# Patient Record
Sex: Female | Born: 1945 | Race: White | Hispanic: No | Marital: Married | Smoking: Never smoker
Health system: Southern US, Community
[De-identification: ages and names within clinical notes are randomized; demographics above are authoritative.]

## PROBLEM LIST (undated history)

## (undated) DIAGNOSIS — K219 Gastro-esophageal reflux disease without esophagitis: Secondary | ICD-10-CM

## (undated) DIAGNOSIS — Z87448 Personal history of other diseases of urinary system: Secondary | ICD-10-CM

## (undated) DIAGNOSIS — E785 Hyperlipidemia, unspecified: Secondary | ICD-10-CM

## (undated) DIAGNOSIS — F329 Major depressive disorder, single episode, unspecified: Secondary | ICD-10-CM

## (undated) DIAGNOSIS — F419 Anxiety disorder, unspecified: Secondary | ICD-10-CM

## (undated) DIAGNOSIS — E28319 Asymptomatic premature menopause: Secondary | ICD-10-CM

## (undated) DIAGNOSIS — F32A Depression, unspecified: Secondary | ICD-10-CM

## (undated) DIAGNOSIS — Q632 Ectopic kidney: Secondary | ICD-10-CM

## (undated) DIAGNOSIS — M1990 Unspecified osteoarthritis, unspecified site: Secondary | ICD-10-CM

## (undated) DIAGNOSIS — T7840XA Allergy, unspecified, initial encounter: Secondary | ICD-10-CM

## (undated) HISTORY — PX: ABDOMINAL HYSTERECTOMY: SHX81

## (undated) HISTORY — PX: TONSILLECTOMY: SUR1361

## (undated) HISTORY — DX: Ectopic kidney: Q63.2

## (undated) HISTORY — DX: Unspecified osteoarthritis, unspecified site: M19.90

## (undated) HISTORY — DX: Major depressive disorder, single episode, unspecified: F32.9

## (undated) HISTORY — DX: Depression, unspecified: F32.A

## (undated) HISTORY — DX: Allergy, unspecified, initial encounter: T78.40XA

## (undated) HISTORY — PX: OTHER SURGICAL HISTORY: SHX169

## (undated) HISTORY — DX: Anxiety disorder, unspecified: F41.9

## (undated) HISTORY — DX: Hyperlipidemia, unspecified: E78.5

## (undated) HISTORY — DX: Gastro-esophageal reflux disease without esophagitis: K21.9

## (undated) HISTORY — DX: Personal history of other diseases of urinary system: Z87.448

## (undated) HISTORY — DX: Asymptomatic premature menopause: E28.319

---

## 1975-07-20 HISTORY — PX: OOPHORECTOMY: SHX86

## 1988-07-19 HISTORY — PX: OTHER SURGICAL HISTORY: SHX169

## 1993-07-19 HISTORY — PX: BREAST BIOPSY: SHX20

## 2007-07-20 DIAGNOSIS — Z87448 Personal history of other diseases of urinary system: Secondary | ICD-10-CM

## 2007-07-20 HISTORY — DX: Personal history of other diseases of urinary system: Z87.448

## 2007-11-09 ENCOUNTER — Emergency Department (HOSPITAL_COMMUNITY): Admission: EM | Admit: 2007-11-09 | Discharge: 2007-11-09 | Payer: Self-pay | Admitting: Family Medicine

## 2007-11-20 ENCOUNTER — Encounter: Admission: RE | Admit: 2007-11-20 | Discharge: 2007-11-20 | Payer: Self-pay | Admitting: Internal Medicine

## 2008-04-04 ENCOUNTER — Ambulatory Visit: Payer: Self-pay | Admitting: Cardiology

## 2008-04-04 LAB — CONVERTED CEMR LAB
BUN: 21 mg/dL (ref 6–23)
Basophils Absolute: 0.1 10*3/uL (ref 0.0–0.1)
Basophils Relative: 1 % (ref 0.0–3.0)
CO2: 28 meq/L (ref 19–32)
Calcium: 9.5 mg/dL (ref 8.4–10.5)
Creatinine, Ser: 0.8 mg/dL (ref 0.4–1.2)
Eosinophils Absolute: 0.2 10*3/uL (ref 0.0–0.7)
Eosinophils Relative: 3.5 % (ref 0.0–5.0)
Hemoglobin: 13.8 g/dL (ref 12.0–15.0)
Lymphocytes Relative: 42.9 % (ref 12.0–46.0)
MCHC: 35.2 g/dL (ref 30.0–36.0)
MCV: 91.7 fL (ref 78.0–100.0)
Neutro Abs: 2.3 10*3/uL (ref 1.4–7.7)
RBC: 4.28 M/uL (ref 3.87–5.11)
aPTT: 36.2 s — ABNORMAL HIGH (ref 21.7–29.8)

## 2008-04-05 ENCOUNTER — Inpatient Hospital Stay (HOSPITAL_BASED_OUTPATIENT_CLINIC_OR_DEPARTMENT_OTHER): Admission: RE | Admit: 2008-04-05 | Discharge: 2008-04-05 | Payer: Self-pay | Admitting: Cardiology

## 2008-04-05 ENCOUNTER — Ambulatory Visit: Payer: Self-pay | Admitting: Cardiology

## 2008-04-11 ENCOUNTER — Encounter: Payer: Self-pay | Admitting: Cardiology

## 2008-04-11 ENCOUNTER — Ambulatory Visit: Payer: Self-pay | Admitting: Cardiology

## 2008-04-11 ENCOUNTER — Ambulatory Visit: Payer: Self-pay

## 2008-04-11 LAB — CONVERTED CEMR LAB
AST: 24 units/L (ref 0–37)
Albumin: 4.2 g/dL (ref 3.5–5.2)
Alkaline Phosphatase: 59 units/L (ref 39–117)
LDL Cholesterol: 101 mg/dL — ABNORMAL HIGH (ref 0–99)
Total Bilirubin: 0.6 mg/dL (ref 0.3–1.2)
Total CHOL/HDL Ratio: 2.9
Total Protein: 7.3 g/dL (ref 6.0–8.3)
VLDL: 20 mg/dL (ref 0–40)

## 2008-04-17 ENCOUNTER — Ambulatory Visit: Payer: Self-pay | Admitting: Cardiology

## 2008-06-07 ENCOUNTER — Ambulatory Visit: Payer: Self-pay | Admitting: Cardiology

## 2008-06-07 LAB — CONVERTED CEMR LAB
Direct LDL: 152 mg/dL
TSH: 2.71 microintl units/mL (ref 0.35–5.50)

## 2008-06-10 ENCOUNTER — Ambulatory Visit: Payer: Self-pay | Admitting: Cardiology

## 2008-07-09 ENCOUNTER — Ambulatory Visit: Payer: Self-pay | Admitting: Cardiology

## 2008-08-05 ENCOUNTER — Ambulatory Visit: Payer: Self-pay | Admitting: Psychology

## 2008-08-26 ENCOUNTER — Ambulatory Visit: Payer: Self-pay | Admitting: Psychology

## 2008-09-09 ENCOUNTER — Ambulatory Visit: Payer: Self-pay | Admitting: Psychology

## 2008-09-23 ENCOUNTER — Ambulatory Visit: Payer: Self-pay | Admitting: Psychology

## 2008-10-21 ENCOUNTER — Ambulatory Visit: Payer: Self-pay | Admitting: Psychology

## 2008-11-04 ENCOUNTER — Ambulatory Visit: Payer: Self-pay | Admitting: Psychology

## 2008-11-11 ENCOUNTER — Encounter: Payer: Self-pay | Admitting: Internal Medicine

## 2008-11-11 ENCOUNTER — Ambulatory Visit: Payer: Self-pay | Admitting: Internal Medicine

## 2008-11-11 DIAGNOSIS — M722 Plantar fascial fibromatosis: Secondary | ICD-10-CM | POA: Insufficient documentation

## 2008-11-11 DIAGNOSIS — G43009 Migraine without aura, not intractable, without status migrainosus: Secondary | ICD-10-CM | POA: Insufficient documentation

## 2008-11-11 DIAGNOSIS — N951 Menopausal and female climacteric states: Secondary | ICD-10-CM | POA: Insufficient documentation

## 2008-11-11 DIAGNOSIS — J309 Allergic rhinitis, unspecified: Secondary | ICD-10-CM | POA: Insufficient documentation

## 2008-11-11 DIAGNOSIS — I059 Rheumatic mitral valve disease, unspecified: Secondary | ICD-10-CM | POA: Insufficient documentation

## 2008-11-11 DIAGNOSIS — F418 Other specified anxiety disorders: Secondary | ICD-10-CM | POA: Insufficient documentation

## 2008-11-11 DIAGNOSIS — E785 Hyperlipidemia, unspecified: Secondary | ICD-10-CM | POA: Insufficient documentation

## 2008-11-11 DIAGNOSIS — K219 Gastro-esophageal reflux disease without esophagitis: Secondary | ICD-10-CM | POA: Insufficient documentation

## 2008-11-13 ENCOUNTER — Encounter (INDEPENDENT_AMBULATORY_CARE_PROVIDER_SITE_OTHER): Payer: Self-pay | Admitting: *Deleted

## 2008-11-13 ENCOUNTER — Encounter: Payer: Self-pay | Admitting: Internal Medicine

## 2008-11-15 ENCOUNTER — Ambulatory Visit: Payer: Self-pay | Admitting: Internal Medicine

## 2008-11-15 ENCOUNTER — Encounter: Payer: Self-pay | Admitting: Internal Medicine

## 2008-11-23 ENCOUNTER — Ambulatory Visit: Payer: Self-pay | Admitting: Internal Medicine

## 2008-11-23 ENCOUNTER — Inpatient Hospital Stay (HOSPITAL_COMMUNITY): Admission: EM | Admit: 2008-11-23 | Discharge: 2008-11-26 | Payer: Self-pay | Admitting: Emergency Medicine

## 2008-11-25 ENCOUNTER — Encounter: Payer: Self-pay | Admitting: Internal Medicine

## 2008-11-26 ENCOUNTER — Encounter: Payer: Self-pay | Admitting: Internal Medicine

## 2008-12-02 ENCOUNTER — Telehealth (INDEPENDENT_AMBULATORY_CARE_PROVIDER_SITE_OTHER): Payer: Self-pay | Admitting: *Deleted

## 2008-12-03 ENCOUNTER — Telehealth (INDEPENDENT_AMBULATORY_CARE_PROVIDER_SITE_OTHER): Payer: Self-pay | Admitting: *Deleted

## 2008-12-06 ENCOUNTER — Telehealth (INDEPENDENT_AMBULATORY_CARE_PROVIDER_SITE_OTHER): Payer: Self-pay | Admitting: *Deleted

## 2008-12-06 ENCOUNTER — Encounter: Payer: Self-pay | Admitting: Internal Medicine

## 2009-01-29 ENCOUNTER — Telehealth: Payer: Self-pay | Admitting: Internal Medicine

## 2009-02-10 ENCOUNTER — Telehealth (INDEPENDENT_AMBULATORY_CARE_PROVIDER_SITE_OTHER): Payer: Self-pay | Admitting: *Deleted

## 2009-02-13 ENCOUNTER — Ambulatory Visit: Payer: Self-pay | Admitting: Internal Medicine

## 2009-02-13 DIAGNOSIS — I6350 Cerebral infarction due to unspecified occlusion or stenosis of unspecified cerebral artery: Secondary | ICD-10-CM | POA: Insufficient documentation

## 2009-02-13 LAB — CONVERTED CEMR LAB
AST: 25 units/L (ref 0–37)
Albumin: 4.4 g/dL (ref 3.5–5.2)
Basophils Absolute: 0 10*3/uL (ref 0.0–0.1)
CO2: 26 meq/L (ref 19–32)
Chloride: 108 meq/L (ref 96–112)
Cholesterol, target level: 200 mg/dL
GFR calc non Af Amer: 77.28 mL/min (ref >60)
Glucose, Bld: 92 mg/dL (ref 70–99)
HCT: 41.4 % (ref 36.0–46.0)
HDL goal, serum: 40 mg/dL
Hemoglobin, Urine: NEGATIVE
Hemoglobin: 14 g/dL (ref 12.0–15.0)
Ketones, ur: NEGATIVE mg/dL
LDL Goal: 100 mg/dL
Lymphs Abs: 2.3 10*3/uL (ref 0.7–4.0)
MCHC: 33.9 g/dL (ref 30.0–36.0)
Monocytes Relative: 9.8 % (ref 3.0–12.0)
Neutro Abs: 2.2 10*3/uL (ref 1.4–7.7)
Potassium: 4.1 meq/L (ref 3.5–5.1)
RDW: 12.7 % (ref 11.5–14.6)
Sodium: 143 meq/L (ref 135–145)
TSH: 3.32 microintl units/mL (ref 0.35–5.50)
Urine Glucose: NEGATIVE mg/dL
Urobilinogen, UA: 0.2 (ref 0.0–1.0)

## 2009-02-14 ENCOUNTER — Encounter: Payer: Self-pay | Admitting: Internal Medicine

## 2009-02-14 DIAGNOSIS — M8580 Other specified disorders of bone density and structure, unspecified site: Secondary | ICD-10-CM | POA: Insufficient documentation

## 2009-02-14 DIAGNOSIS — M810 Age-related osteoporosis without current pathological fracture: Secondary | ICD-10-CM | POA: Insufficient documentation

## 2009-02-24 ENCOUNTER — Encounter: Payer: Self-pay | Admitting: Internal Medicine

## 2009-02-24 ENCOUNTER — Ambulatory Visit: Payer: Self-pay

## 2009-02-25 ENCOUNTER — Encounter: Payer: Self-pay | Admitting: Internal Medicine

## 2009-03-21 ENCOUNTER — Telehealth (INDEPENDENT_AMBULATORY_CARE_PROVIDER_SITE_OTHER): Payer: Self-pay | Admitting: *Deleted

## 2009-03-21 ENCOUNTER — Ambulatory Visit: Payer: Self-pay | Admitting: Internal Medicine

## 2009-03-21 ENCOUNTER — Telehealth: Payer: Self-pay | Admitting: Internal Medicine

## 2009-03-21 LAB — CONVERTED CEMR LAB
Nitrite: POSITIVE
Urobilinogen, UA: 0.2 (ref 0.0–1.0)

## 2009-03-23 ENCOUNTER — Emergency Department (HOSPITAL_COMMUNITY): Admission: EM | Admit: 2009-03-23 | Discharge: 2009-03-23 | Payer: Self-pay | Admitting: Emergency Medicine

## 2009-03-27 ENCOUNTER — Telehealth: Payer: Self-pay | Admitting: Internal Medicine

## 2009-03-31 ENCOUNTER — Ambulatory Visit (HOSPITAL_COMMUNITY): Admission: RE | Admit: 2009-03-31 | Discharge: 2009-03-31 | Payer: Self-pay | Admitting: Internal Medicine

## 2009-04-02 ENCOUNTER — Encounter: Payer: Self-pay | Admitting: Internal Medicine

## 2009-04-07 ENCOUNTER — Telehealth: Payer: Self-pay | Admitting: Internal Medicine

## 2009-06-17 ENCOUNTER — Telehealth: Payer: Self-pay | Admitting: Internal Medicine

## 2009-07-09 ENCOUNTER — Telehealth (INDEPENDENT_AMBULATORY_CARE_PROVIDER_SITE_OTHER): Payer: Self-pay | Admitting: *Deleted

## 2009-08-01 ENCOUNTER — Encounter (INDEPENDENT_AMBULATORY_CARE_PROVIDER_SITE_OTHER): Payer: Self-pay | Admitting: *Deleted

## 2009-08-01 ENCOUNTER — Telehealth: Payer: Self-pay | Admitting: Internal Medicine

## 2009-08-01 ENCOUNTER — Ambulatory Visit: Payer: Self-pay | Admitting: Internal Medicine

## 2009-08-01 DIAGNOSIS — R5381 Other malaise: Secondary | ICD-10-CM | POA: Insufficient documentation

## 2009-08-01 DIAGNOSIS — R9409 Abnormal results of other function studies of central nervous system: Secondary | ICD-10-CM | POA: Insufficient documentation

## 2009-08-01 DIAGNOSIS — F411 Generalized anxiety disorder: Secondary | ICD-10-CM | POA: Insufficient documentation

## 2009-08-01 DIAGNOSIS — H538 Other visual disturbances: Secondary | ICD-10-CM | POA: Insufficient documentation

## 2009-08-01 DIAGNOSIS — R5383 Other fatigue: Secondary | ICD-10-CM

## 2009-09-16 ENCOUNTER — Encounter: Payer: Self-pay | Admitting: Internal Medicine

## 2009-10-03 ENCOUNTER — Encounter: Payer: Self-pay | Admitting: Internal Medicine

## 2009-10-10 ENCOUNTER — Telehealth: Payer: Self-pay | Admitting: Internal Medicine

## 2009-10-10 ENCOUNTER — Ambulatory Visit: Payer: Self-pay | Admitting: Internal Medicine

## 2009-10-10 DIAGNOSIS — N61 Mastitis without abscess: Secondary | ICD-10-CM | POA: Insufficient documentation

## 2009-10-28 ENCOUNTER — Ambulatory Visit: Payer: Self-pay | Admitting: Psychology

## 2009-11-06 ENCOUNTER — Telehealth: Payer: Self-pay | Admitting: Internal Medicine

## 2009-12-18 ENCOUNTER — Ambulatory Visit: Payer: Self-pay | Admitting: Internal Medicine

## 2009-12-18 DIAGNOSIS — B354 Tinea corporis: Secondary | ICD-10-CM | POA: Insufficient documentation

## 2009-12-18 DIAGNOSIS — L255 Unspecified contact dermatitis due to plants, except food: Secondary | ICD-10-CM | POA: Insufficient documentation

## 2010-04-03 ENCOUNTER — Telehealth: Payer: Self-pay | Admitting: Internal Medicine

## 2010-04-06 ENCOUNTER — Ambulatory Visit (HOSPITAL_COMMUNITY): Admission: RE | Admit: 2010-04-06 | Discharge: 2010-04-06 | Payer: Self-pay

## 2010-04-06 ENCOUNTER — Ambulatory Visit: Payer: Self-pay | Admitting: Internal Medicine

## 2010-04-08 ENCOUNTER — Encounter: Payer: Self-pay | Admitting: Cardiology

## 2010-05-05 ENCOUNTER — Telehealth: Payer: Self-pay | Admitting: Internal Medicine

## 2010-05-21 ENCOUNTER — Telehealth: Payer: Self-pay | Admitting: Internal Medicine

## 2010-08-18 NOTE — Consult Note (Signed)
Summary: [facility]   Imported By: Lester Brinson 09/19/2009 11:12:13  _____________________________________________________________________  External Attachment:    Type:   Image     Comment:   External Document

## 2010-08-18 NOTE — Progress Notes (Signed)
Summary: NEEDS CREATIN AND BUN  Phone Note Call from Patient   Caller: Patient Summary of Call: PT NEEDS TO HAVE A CREATIN AND BUN FOR A MRI OF THE BRAIN SHE IS HAVING ON MONDAY AT 2:00 AT [facility].  THIS TEST WAS ORDERED BY DR. Cherre Blanc HER NEUROLOGIST AT [facility].  WILL DR. Jonny Ruiz ORDER THESE LABS FOR HER? CELL NUMBER [phone] Initial call taken by: Hilarie Fredrickson,  April 03, 2010 4:10 PM  Follow-up for Phone Call        ok for bun/cr - v58.69 Follow-up by: Corwin Levins MD,  April 03, 2010 4:28 PM  Additional Follow-up for Phone Call Additional follow up Details #1::        called pt informed of above information. Additional Follow-up by: Robin Ewing CMA Duncan Dull),  April 03, 2010 4:43 PM

## 2010-08-18 NOTE — Progress Notes (Signed)
Summary: lipitor  Phone Note Refill Request Message from:  Fax from Pharmacy on November 06, 2009 8:55 AM  Refills Requested: Medication #1:  LIPITOR 40 MG TABS 1 by mouth at bedtime  Method Requested: Electronic Initial call taken by: Orlan Leavens,  November 06, 2009 8:55 AM    Prescriptions: LIPITOR 40 MG TABS (ATORVASTATIN CALCIUM) 1 by mouth at bedtime  #30 x 3   Entered by:   Orlan Leavens   Authorized by:   Etta Grandchild MD   Signed by:   Orlan Leavens on 11/06/2009   Method used:   [facility]        [facility] [phone][facility] (retail[facility]       [address]       [place], [place]  [address]       Ph: [phone] or [phone]       Fax: [phone]   RxID:   [id number]

## 2010-08-18 NOTE — Progress Notes (Signed)
Summary: REFERRAL   Phone Note Call from Patient   Summary of Call: See phone note from 12/22. Pt requested a referral to [facility] for neurology. She called to check the status of this. No new referral was put in, please order in.  Initial call taken by: Lamar Sprinkles, CMA,  August 01, 2009 11:38 AM     Appended Document: REFERRAL  Called [facility] Law[facility] Cousins [phone] scheduled for march 1@11 :00 Dr Cherre Blanc -lmtc  faxed notes to [phone] left msg to inform pt not available -lmtcb

## 2010-08-18 NOTE — Progress Notes (Signed)
  Phone Note Call from Patient   Caller: Patient Summary of Call: Pt called to get a refill on Imitrex. Per previous phone note, [facility] [address] Pt has been informed of refill. Initial call taken by: Alysia Penna,  May 21, 2010 4:53 PM

## 2010-08-18 NOTE — Assessment & Plan Note (Signed)
Summary: trouble focusing / SD   Vital Signs:  Patient profile:   65 year old female Height:      65 inches Weight:      160 pounds BMI:     26.72 O2 Sat:      97 % on Room air Temp:     97.4 degrees F oral Pulse rate:   71 / minute BP sitting:   130 / 80  (left arm) Cuff size:   regular  Vitals Entered ByZella Ball Ewing (August 01, 2009 2:30 PM)  O2 Flow:  Room air CC: trouble focusing,anxious/RE   Primary Care Provider:  Etta Grandchild MD  CC:  trouble focusing and anxious/RE.  History of Present Illness: works as asst to HR for [employer];  has some increased cognitive errors at work, but not clear if due to anxiety or other; states had episode of blurred vision  for about 3 hrs but resolved but no headache and back to noraml since;  Pt denies CP, sob, doe, wheezing, orthopnea, pnd, worsening LE edema, palps, dizziness or syncope  Pt denies other new neuro symptoms such as headache, facial or extremity weakness .  Has ongoing severe stressors at work.  Problems Prior to Update: 1)  Magnetic Resonance Imaging, Brain, Abnormal  (ICD-794.09) 2)  Fatigue  (ICD-780.79) 3)  Anxiety  (ICD-300.00) 4)  Blurred Vision  (ICD-368.8) 5)  Cva  (ICD-434.91) 6)  Plantar Fasciitis, Right  (ICD-728.71) 7)  Preventive Health Care  (ICD-V70.0) 8)  Osteopenia  (ICD-733.90) 9)  Menopause, Early  (ICD-627.2) 10)  Hyperlipidemia  (ICD-272.4) 11)  Depression  (ICD-311) 12)  Mitral Valve Prolapse  (ICD-424.0) 13)  Common Migraine  (ICD-346.10) 14)  Allergic Rhinitis  (ICD-477.9) 15)  Gerd  (ICD-530.81)  Medications Prior to Update: 1)  Imitrex 50 Mg Tabs (Sumatriptan Succinate) .Marland Kitchen.. 1 By Mouth Once Daily As Needed For Headaches 2)  Advil Cold/sinus 30-200 Mg Tabs (Pseudoephedrine-Ibuprofen) .Marland Kitchen.. 1 By Mouth Once Daily As Needed 3)  Lipitor 40 Mg Tabs (Atorvastatin Calcium) .Marland Kitchen.. 1 By Mouth At Bedtime 4)  Asa 325mg  .... Take 1 Tablet By Mouth Once A Day 5)  Valium 5 Mg Tabs (Diazepam) ....  Take 1-2 By Mouth One Hour Prior To Mri For Anxiety  Current Medications (verified): 1)  Imitrex 50 Mg Tabs (Sumatriptan Succinate) .Marland Kitchen.. 1 By Mouth Once Daily As Needed For Headaches 2)  Advil Cold/sinus 30-200 Mg Tabs (Pseudoephedrine-Ibuprofen) .Marland Kitchen.. 1 By Mouth Once Daily As Needed 3)  Lipitor 40 Mg Tabs (Atorvastatin Calcium) .Marland Kitchen.. 1 By Mouth At Bedtime 4)  Asa 325mg  .... Take 1 Tablet By Mouth Once A Day 5)  Valium 5 Mg Tabs (Diazepam) .... 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   Head:  normocephalic and atraumatic.   Eyes:  vision grossly intact, pupils equal, and pupils round.   Ears:  R ear normal and L ear normal.   Nose:  no external deformity and no nasal discharge.   Mouth:  no gingival abnormalities and pharynx pink and moist.   Neck:  supple and no masses.   Lungs:  normal respiratory effort and normal breath sounds.   Heart:  normal rate and regular rhythm.   Abdomen:  soft, non-tender, and normal  bowel sounds.   Msk:  no joint tenderness and no joint swelling.   Extremities:  no edema, no erythema  Neurologic:  cranial nerves II-XII intact, strength normal in all extremities, and sensation intact to light touch.     Impression & Recommendations:  Problem # 1:  BLURRED VISION (ICD-368.8) recurrent - diff inlcudes ocular migraine, atypical migraine, or even  MS, or even option neuritis or TIA - asymtpo now, exam benign  but will nede referral to neuro, already referred per dr Yetta Barre  Problem # 2:  ANXIETY (ICD-300.00)  Her updated medication list for this problem includes:    Valium 5 Mg Tabs (Diazepam) .Marland Kitchen... Take 1-2 by mouth one hour prior to mri for anxiety stable overall by hx and exam, ok to continue meds/tx as is, flare today likely secondary to above but does quite a bit of tension over work responsiblities  Problem # 3:  FATIGUE (ICD-780.79) exam benign, to check labs below; follow with expectant management  Orders: TLB-BMP (Basic Metabolic Panel-BMET) (80048-METABOL) TLB-CBC Platelet - w/Differential (85025-CBCD) TLB-Hepatic/Liver Function Pnl (80076-HEPATIC) TLB-TSH (Thyroid Stimulating Hormone) (84443-TSH) TLB-Sedimentation Rate (ESR) (85652-ESR) TLB-IBC Pnl (Iron/FE;Transferrin) (83550-IBC) TLB-B12 + Folate Pnl (82746_82607-B12/FOL) TLB-Udip ONLY (81003-UDIP)  Problem # 4:  MAGNETIC RESONANCE IMAGING, BRAIN, ABNORMAL (ICD-794.09) as above, to f/u neuro, results reviewed with pt  Complete Medication List: 1)  Imitrex 50 Mg Tabs (Sumatriptan succinate) .Marland Kitchen.. 1 by mouth once daily as needed for headaches 2)  Advil Cold/sinus 30-200 Mg Tabs (Pseudoephedrine-ibuprofen) .Marland Kitchen.. 1 by mouth once daily as needed 3)  Lipitor 40 Mg Tabs (Atorvastatin calcium) .Marland Kitchen.. 1 by mouth at bedtime 4)  Asa 325mg   .... Take 1 tablet by mouth once a day 5)  Valium 5 Mg Tabs (Diazepam) .... Take 1-2 by mouth one hour prior to mri for anxiety  Other Orders: T-Vitamin D (25-Hydroxy)  [phone])  Patient Instructions: 1)  please make appt with opthomology for exam 2)  Continue all previous medications as before this visit  3)  Please go to the Lab in the basement for your blood and/or urine tests today 4)  You will be contacted about the referral(s) to: Neurology 5)  Please schedule an appointment with your primary doctor as needed

## 2010-08-18 NOTE — Progress Notes (Signed)
  Phone Note Refill Request Message from:  Patient/call a nurse on May 21, 2010 8:59 AM  Refills Requested: Medication #1:  IMITREX 50 MG TABS 1 by mouth once daily as needed for headaches Initial call taken by: Rock Nephew CMA,  May 21, 2010 8:59 AM    Prescriptions: IMITREX 50 MG TABS (SUMATRIPTAN SUCCINATE) 1 by mouth once daily as needed for headaches  #12 x 5   Entered by:   Rock Nephew CMA   Authorized by:   Etta Grandchild MD   Signed by:   Rock Nephew CMA on 05/21/2010   Method used:   [facility]        [facility] [phone][facility] (retail[facility]       [address]       [place], [place]  [address]       Ph: [phone] or [phone]       Fax: [phone]   RxID:   [id number]

## 2010-08-18 NOTE — Assessment & Plan Note (Signed)
Summary: JONES PT--MASTITIS. RED AREA ON RIGHT BREAST AND SWOLLEN LYMP...   Vital Signs:  Patient profile:   65 year old female Height:      64 inches Weight:      160.75 pounds BMI:     27.69 O2 Sat:      96 % on Room air Temp:     97 degrees F oral Pulse rate:   72 / minute BP sitting:   128 / 72  (left arm) Cuff size:   regular  Vitals Entered ByZella Ball Ewing (October 10, 2009 4:13 PM)  O2 Flow:  Room air CC: Lymph node under arm swollen, red/RE   Primary Care Provider:  Etta Grandchild MD  CC:  Lymph node under arm swollen and red/RE.  History of Present Illness: here with onset late last PM red, tender, sweling to relatively small area near the right areola, mild breast tender and noted tenderness to a small area in the right axilla as well;  no left breast or axilla symptoms;  no fever, chills, ST, cough, and Pt denies CP, sob, doe, wheezing, orthopnea, pnd, worsening LE edema, palps, dizziness or syncope   No prior hx of mastitis or recent trauma. or known immune disorder.  recent for MS neg per pt, had LP recently neg per pt, despite MRI suggestive of MS.  Has hx of migraine, but MRI abnormal thought due to CVA? Marland KitchenPt denies new neuro symptoms such as headache, facial or extremity weakness   Problems Prior to Update: 1)  Mastitis  (ICD-611.0) 2)  Magnetic Resonance Imaging, Brain, Abnormal  (ICD-794.09) 3)  Fatigue  (ICD-780.79) 4)  Anxiety  (ICD-300.00) 5)  Blurred Vision  (ICD-368.8) 6)  Cva  (ICD-434.91) 7)  Plantar Fasciitis, Right  (ICD-728.71) 8)  Preventive Health Care  (ICD-V70.0) 9)  Osteopenia  (ICD-733.90) 10)  Menopause, Early  (ICD-627.2) 11)  Hyperlipidemia  (ICD-272.4) 12)  Depression  (ICD-311) 13)  Mitral Valve Prolapse  (ICD-424.0) 14)  Common Migraine  (ICD-346.10) 15)  Allergic Rhinitis  (ICD-477.9) 16)  Gerd  (ICD-530.81)  Medications Prior to Update: 1)  Imitrex 50 Mg Tabs (Sumatriptan Succinate) .Marland Kitchen.. 1 By Mouth Once Daily As Needed For  Headaches 2)  Advil Cold/sinus 30-200 Mg Tabs (Pseudoephedrine-Ibuprofen) .Marland Kitchen.. 1 By Mouth Once Daily As Needed 3)  Lipitor 40 Mg Tabs (Atorvastatin Calcium) .Marland Kitchen.. 1 By Mouth At Bedtime 4)  Asa 325mg  .... Take 1 Tablet By Mouth Once A Day 5)  Valium 5 Mg Tabs (Diazepam) .... Take 1-2 By Mouth One Hour Prior To Mri For Anxiety  Current Medications (verified): 1)  Imitrex 50 Mg Tabs (Sumatriptan Succinate) .Marland Kitchen.. 1 By Mouth Once Daily As Needed For Headaches 2)  Advil Cold/sinus 30-200 Mg Tabs (Pseudoephedrine-Ibuprofen) .Marland Kitchen.. 1 By Mouth Once Daily As Needed 3)  Lipitor 40 Mg Tabs (Atorvastatin Calcium) .Marland Kitchen.. 1 By Mouth At Bedtime 4)  Asa 325mg  .... Take 1 Tablet By Mouth Once A Day 5)  Valium 5 Mg Tabs (Diazepam) .... Take 1-2 By Mouth One Hour Prior To Mri For Anxiety 6)  Doxycycline Hyclate 100 Mg Caps (Doxycycline Hyclate) .Marland Kitchen.. 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 Osteoporosis hx of pyelonephritis/sepsis 2009 Anxiety  Past Surgical History: Last updated:  11/11/2008 s/p right ectopic kidney at 65 yo hx of breast biopsy neg - approx 1990 Hysterectomy - due to fibroids  Oophorectomy - due to endometriosis - 1977 Tonsillectomy  Social History: Last updated: 11/11/2008 currently working as Programmer, 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   Head:  normocephalic and atraumatic.   Eyes:  vision grossly intact, pupils equal, and pupils round.     Ears:  R ear normal and L ear normal.   Nose:  no external deformity and no nasal discharge.   Mouth:  no gingival abnormalities and pharynx pink and moist.   Neck:  supple and no masses.   Breasts:  left breast without mass, swelling or erythema;  right breast with 1.5 cm area touching and adjacent to the right areola upper medial quad with small central induration, tender but nonfluctuant, non draining and no nipple d/c;  some tenderness noted in the tail of the breast but no skin changes; also noted right axilla with approx 8mm area tender eyrthema "nodule" prob lymph node again without suppuration Lungs:  normal respiratory effort and normal breath sounds.   Heart:  normal rate and regular rhythm.   Extremities:  no edema, no erythema  Neurologic:  alert & oriented X3 and strength grossly normal in all extremities.     Impression & Recommendations:  Problem # 1:  MASTITIS (ICD-611.0) pt is [employer] employee (actually [employer] who does have contact with clinical nurses on a regular basis, one of whom was known MRSA infection);  cant r/o MRSA in this case - for doxycycline course and follow closely,  to follow for fever, worsening pain, swelling , erythema, chills that should indicate need to present to ER for failed outpt therapy evaluation;  also I menitoned though seems remote at this time the possibility of inflammatory bresat cancer though very rare;  she understands if no better she should return for further eval early next wk and if worse should go to ER   Complete Medication List: 1)  Imitrex 50 Mg Tabs (Sumatriptan succinate) .Marland Kitchen.. 1 by mouth once daily as needed for headaches 2)  Advil Cold/sinus 30-200 Mg Tabs (Pseudoephedrine-ibuprofen) .Marland Kitchen.. 1 by mouth once daily as needed 3)  Lipitor 40 Mg Tabs (Atorvastatin calcium) .Marland Kitchen.. 1 by mouth at bedtime 4)  Asa 325mg   .... Take 1 tablet by mouth once a day 5)  Valium 5 Mg Tabs (Diazepam) .... Take 1-2 by mouth one hour prior to mri for  anxiety 6)  Doxycycline Hyclate 100 Mg Caps (Doxycycline hyclate) .Marland Kitchen.. 1 by mouth two times a day  Patient Instructions: 1)  Please take all new medications as prescribed 2)  Continue all previous medications as before this visit  3)  Please schedule an appointment with your primary doctor as needed Prescriptions: DOXYCYCLINE HYCLATE 100 MG CAPS (DOXYCYCLINE HYCLATE) 1 by mouth two times a day  #20 x 0   Entered and Authorized by:   Corwin Levins MD   Signed by:   Corwin Levins MD on 10/10/2009   Method used:   [facility]        [facility] [phone][facility] (retail)  [address] [place], [place]  [address]       Ph: [phone] or [phone]       Fax: [phone]   RxID:   [id number]

## 2010-08-18 NOTE — Progress Notes (Signed)
Summary: mastitis  Phone Note Call from Patient Call back at Work Phone [phone]   Summary of Call: Patient left message on triage that she is having mastitis. Patient c/o pain and redness @ right breast and swollen lymph node. Should ABX be started? Patient is an employee at [employer] and can come over at anytime if needed. Today the patient works until 3:30 pm. Please advise. Initial call taken by: Lucious Groves,  October 10, 2009 9:42 AM  Follow-up for Phone Call        can she be seen per dr Felicity Coyer today? Follow-up by: Corwin Levins MD,  October 10, 2009 1:30 PM  Additional Follow-up for Phone Call Additional follow up Details #1::        Spoke with patient and she will come this pm to see Leschber. Additional Follow-up by: Lucious Groves,  October 10, 2009 2:16 PM

## 2010-08-18 NOTE — Progress Notes (Signed)
Summary: OV TODAY  Phone Note Call from Patient Call back at Home Phone [phone]   Caller: Patient Call For: Dr Yetta Barre Summary of Call: Pt called states she left message this am regarding visual disturbances last week and not feeling right today. Pt is at work now, [phone] - pt is currently workng. Please call pt asap. Initial call taken by: Verdell Face,  August 01, 2009 12:47 PM  Follow-up for Phone Call        Pt c/o "visual disturbance" for approx 4 to 5 hours once last week. Today she c/o feeling as though she can not focus well. She has had increase in anxiety since her stroke. She does deep breathing, goes to a quiet place and prays when this happens. She did this today and has had some relief. Pt is scheduled for office visit today with Dr Jonny Ruiz for eval. She works at [employer] and has an Charity fundraiser there keeping an eye on her until office visit at 2 today. She will go to the ER with any siginifigant change in symptoms.  Follow-up by: Lamar Sprinkles, CMA,  August 01, 2009 1:14 PM  Additional Follow-up for Phone Call Additional follow up Details #1::        noted Additional Follow-up by: Corwin Levins MD,  August 01, 2009 1:21 PM

## 2010-08-18 NOTE — Progress Notes (Signed)
Summary: refill  Phone Note Refill Request Message from:  Fax from Pharmacy on May 05, 2010 12:41 PM  Refills Requested: Medication #1:  IMITREX 50 MG TABS 1 by mouth once daily as needed for headaches   Dosage confirmed as above?Dosage Confirmed   Supply Requested: 1 month Initial call taken by: Rock Nephew CMA,  May 05, 2010 12:41 PM    Prescriptions: IMITREX 50 MG TABS (SUMATRIPTAN SUCCINATE) 1 by mouth once daily as needed for headaches  #12 x 1   Entered by:   Rock Nephew CMA   Authorized by:   Etta Grandchild MD   Signed by:   Rock Nephew CMA on 05/05/2010   Method used:   [facility]ronically to        [facility] [phone]* (retail)       [address]       [place], [place]  [address]       Ph: [phone] or [phone]       Fax: [phone]   RxID:   [id number]

## 2010-08-18 NOTE — Assessment & Plan Note (Signed)
Summary: rash right breast/cd   Vital Signs:  Patient profile:   65 year old female Height:      64 inches Weight:      164 pounds BMI:     28.25 O2 Sat:      97 % on Room air Temp:     98.3 degrees F oral Pulse rate:   67 / minute Pulse rhythm:   regular Resp:     16 per minute BP sitting:   120 / 72  (left arm) Cuff size:   large  Vitals Entered By: Rock Nephew CMA (December 18, 2009 1:53 PM)  Nutrition Counseling: Patient's BMI is greater than 25 and therefore counseled on weight management options.  O2 Flow:  Room air CC: Itchy rash on R breast, neck an arms Pain Assessment Patient in pain? no        Primary Care Provider:  Etta Grandchild MD  CC:  Itchy rash on R breast and neck an arms.  History of Present Illness: She returns c/o a pruritic rash for about 3 weeks. She has been "pulling weeds" and developed a rash on right forearm and left side of neck but she also has a burning, stinging rash under both breasts (R>L). She has tried OTC cortisone cream without much relief.  Current Medications (verified): 1)  Imitrex 50 Mg Tabs (Sumatriptan Succinate) .Marland Kitchen.. 1 By Mouth Once Daily As Needed For Headaches 2)  Advil Cold/sinus 30-200 Mg Tabs (Pseudoephedrine-Ibuprofen) .Marland Kitchen.. 1 By Mouth Once Daily As Needed 3)  Lipitor 40 Mg Tabs (Atorvastatin Calcium) .Marland Kitchen.. 1 By Mouth At Bedtime 4)  Asa 325mg  .... Take 1 Tablet By Mouth Once A Day 5)  Valium 5 Mg Tabs (Diazepam) .... Take 1-2 By Mouth One Hour Prior To Mri For Anxiety  Allergies (verified): No Known Drug Allergies  Past History:  Past Medical History: Reviewed history from 08/01/2009 and no changes required. 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 s/p right ectopic kidney at 65 yo hx of breast biopsy neg - approx 1990 Hysterectomy - due to fibroids  Oophorectomy - due to endometriosis - 1977 Tonsillectomy  Family History: Reviewed history from 11/11/2008 and no changes required. mother, cousin and aunt with breast cancer mother and uncle with ETOH abuse mother and sisters with elevated cholesterol uncle with DM  Social History: Reviewed history from 11/11/2008 and no changes required. 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  The patient denies anorexia, fever, weight loss, chest pain, peripheral edema, prolonged cough, headaches, hemoptysis, abdominal pain, hematuria, enlarged lymph nodes, angioedema, and breast masses.   Derm:  Complains of itching and rash; denies changes in color of skin, changes in nail beds, dryness, flushing, lesion(s), and poor wound healing. Heme:  Denies abnormal bruising, enlarge lymph nodes, and fevers.  Physical Exam  General:  alert and well-developed.   Head:  normocephalic and atraumatic.   Eyes:  No corneal or conjunctival inflammation noted. EOMI. Perrla. Funduscopic exam benign, without hemorrhages, exudates or papilledema. Vision grossly  normal. Mouth:  no gingival abnormalities and pharynx pink and moist.   Neck:  supple and no masses.   Lungs:  normal respiratory effort and normal breath sounds.   Heart:  normal rate and regular rhythm.   Abdomen:  soft, non-tender, and normal bowel sounds.   Msk:  normal ROM, no joint tenderness, no joint swelling, no joint warmth, no redness over joints, no joint deformities, no joint instability, and no crepitation.   Pulses:  R and L carotid,radial,femoral,dorsalis pedis and posterior tibial pulses are full and equal  bilaterally Extremities:  no edema, no erythema  Neurologic:  alert & oriented X3 and strength grossly normal in all extremities.   Skin:  she has two areas of linear patches of erythema and scaling with early lichenification on the right FA and left neck. She has a separate rash under the breasts that is evidenced as confluent wet, erythematous, patches with sharp edges and no satellties lesions, erythema, streaking, exudate, or induration. Cervical Nodes:  no anterior cervical adenopathy and no posterior cervical adenopathy.   Axillary Nodes:  no R axillary adenopathy and no L axillary adenopathy.   Psych:  Cognition and judgment appear intact. Alert and cooperative with normal attention span and concentration. No apparent delusions, illusions, hallucinations   Impression & Recommendations:  Problem # 1:  TINEA CORPORIS (ICD-110.5) Assessment New start loprox  Problem # 2:  CONTACT DERMATITIS&OTHER ECZEMA DUE TO PLANTS (ICD-692.6) Assessment: New  give depo-medrol IM today Her updated medication list for this problem includes:    Clobetasol Propionate 0.05 % Oint (Clobetasol propionate) .Marland Kitchen... Apply to rash on right arm and neck two times a day for 14 days  Orders: Depo- Medrol 80mg  (J1040) Depo- Medrol 40mg  (J1030) Admin of Therapeutic Inj  intramuscular or subcutaneous (62694)  Complete Medication List: 1)  Imitrex 50 Mg Tabs (Sumatriptan succinate) .Marland Kitchen.. 1 by mouth once daily as needed for headaches 2)  Advil Cold/sinus 30-200 Mg Tabs (Pseudoephedrine-ibuprofen) .Marland Kitchen.. 1 by mouth once daily as needed 3)  Lipitor 40 Mg Tabs (Atorvastatin calcium) .Marland Kitchen.. 1 by mouth at bedtime 4)  Asa 325mg   .... Take 1 tablet by mouth once a day 5)  Valium 5 Mg Tabs (Diazepam) .... Take 1-2 by mouth one hour prior to mri for anxiety 6)  Loprox 0.77 % Gel (Ciclopirox) .... Apply to rash under breasts two times a day for 14 days 7)  Clobetasol Propionate 0.05 % Oint (Clobetasol propionate) .... Apply to  rash on right arm and neck two times a day for 14 days  Patient Instructions: 1)  Please schedule a follow-up appointment in 1 month. 2)  It is important that you exercise regularly at least 20 minutes 5 times a week. If you develop chest pain, have severe difficulty breathing, or feel very tired , stop exercising immediately and seek medical attention. 3)  You need to lose weight. Consider a lower calorie diet and regular exercise.  Prescriptions: CLOBETASOL PROPIONATE 0.05 % OINT (CLOBETASOL PROPIONATE) Apply to rash on right arm and neck two times a day for 14 days  #30 gms x 1   Entered and Authorized by:   Etta Grandchild MD   Signed by:   Etta Grandchild MD on 12/18/2009   Method used:   Electronically to        [facility] [phone]* (retail)       [address]       [place]  [place], [place]  [address]       Ph: [phone] or [phone]       Fax: [phone]   RxID:   [id number] LOPROX 0.77 % GEL (CICLOPIROX) Apply to rash under breasts two times a day for 14 days  #60 gms x 1   Entered and Authorized by:   Etta Grandchild MD   Signed by:   Etta Grandchild MD on 12/18/2009   Method used:   Electronically to        [facility] [phone]* (retail)       [address]       [place], [place]  [address]       Ph: [phone] or [phone]       Fax: [phone]   RxID:   [id number]    Medication Administration  Injection # 1:    Medication: Depo- Medrol 80mg     Diagnosis: CONTACT DERMATITIS&OTHER ECZEMA DUE TO PLANTS (ICD-692.6)    Route: IM    Site: L deltoid    Exp Date: 07/2012    Lot #: [id number]    Mfr: pfizer    Patient tolerated injection without complications    Given by: Rock Nephew CMA (December 19, 2009 1:40 PM)  Injection # 2:    Medication: Depo- Medrol 40mg     Diagnosis: CONTACT DERMATITIS&OTHER ECZEMA DUE TO PLANTS (ICD-692.6)    Route: IM    Site: R deltoid    Exp Date: 01/20114    Lot #: [id number]    Mfr:  pfizer    Patient tolerated injection without complications    Given by: Rock Nephew CMA (December 19, 2009 1:40 PM)  Orders Added: 1)  Est. Patient Level IV [17616] 2)  Depo- Medrol 80mg  [J1040] 3)  Depo- Medrol 40mg  [J1030] 4)  Admin of Therapeutic Inj  intramuscular or subcutaneous [07371]

## 2010-08-18 NOTE — Letter (Signed)
Summary: [facility] - Office Visit  [facility] - Office Visit   Imported By: Marylou Mccoy 05/07/2010 13:12:09  _____________________________________________________________________  External Attachment:    Type:   Image     Comment:   External Document

## 2010-08-18 NOTE — Letter (Signed)
Summary: [facility] Consult Scheduled Letter  [facility]  [address] [place], [place] [address]   Phone: [phone]  Fax: [phone]      08/01/2009 MRN: [id number]  [facility] [address] [place], [place]  [address]    Dear Ms. Mumme,      We have scheduled an appointment for you. At the recommendation of Dr.Thomas Yetta Barre we have scheduled you a consult with Dr.Bushnell on March 1,2011 at 11:00am. Their phone number is [phone].If this appointment day and time is not convenient for you, please feel free to call the office of the doctor you are being referred to at the number listed above and reschedule the appointment.   [facility] [place], [place], [address]  Phone Number:  [phone]    Thank you,  Patient Care Coordinator [facility]

## 2010-09-03 ENCOUNTER — Inpatient Hospital Stay (INDEPENDENT_AMBULATORY_CARE_PROVIDER_SITE_OTHER)
Admission: RE | Admit: 2010-09-03 | Discharge: 2010-09-03 | Disposition: A | Discharge status: Home / Self Care | Payer: Commercial Managed Care - PPO | Source: Ambulatory Visit | Attending: Family Medicine | Admitting: Family Medicine

## 2010-09-03 DIAGNOSIS — J019 Acute sinusitis, unspecified: Secondary | ICD-10-CM

## 2010-10-23 LAB — CBC
Hemoglobin: 14.4 g/dL (ref 12.0–15.0)
MCHC: 33.5 g/dL (ref 30.0–36.0)
RBC: 4.6 MIL/uL (ref 3.87–5.11)
WBC: 8.8 10*3/uL (ref 4.0–10.5)

## 2010-10-23 LAB — POCT I-STAT, CHEM 8
BUN: 17 mg/dL (ref 6–23)
Calcium, Ion: 1.07 mmol/L — ABNORMAL LOW (ref 1.12–1.32)
Chloride: 108 mEq/L (ref 96–112)
Creatinine, Ser: 1.1 mg/dL (ref 0.4–1.2)
Glucose, Bld: 102 mg/dL — ABNORMAL HIGH (ref 70–99)
HCT: 47 % — ABNORMAL HIGH (ref 36.0–46.0)
Potassium: 3.9 mEq/L (ref 3.5–5.1)

## 2010-10-23 LAB — DIFFERENTIAL
Basophils Relative: 0 % (ref 0–1)
Lymphs Abs: 3 10*3/uL (ref 0.7–4.0)
Monocytes Absolute: 0.8 10*3/uL (ref 0.1–1.0)
Monocytes Relative: 9 % (ref 3–12)
Neutro Abs: 4.8 10*3/uL (ref 1.7–7.7)

## 2010-10-23 LAB — URINALYSIS, ROUTINE W REFLEX MICROSCOPIC
Glucose, UA: NEGATIVE mg/dL
Specific Gravity, Urine: 1.019 (ref 1.005–1.030)
Urobilinogen, UA: 0.2 mg/dL (ref 0.0–1.0)

## 2010-10-23 LAB — URINE MICROSCOPIC-ADD ON

## 2010-10-23 LAB — URINE CULTURE

## 2010-10-27 LAB — CBC
HCT: 40.8 % (ref 36.0–46.0)
Hemoglobin: 13.9 g/dL (ref 12.0–15.0)
MCHC: 34 g/dL (ref 30.0–36.0)
MCV: 91.5 fL (ref 78.0–100.0)
Platelets: 260 10*3/uL (ref 150–400)
RDW: 13.8 % (ref 11.5–15.5)

## 2010-10-27 LAB — COMPREHENSIVE METABOLIC PANEL
Albumin: 4 g/dL (ref 3.5–5.2)
Alkaline Phosphatase: 58 U/L (ref 39–117)
BUN: 14 mg/dL (ref 6–23)
Creatinine, Ser: 0.82 mg/dL (ref 0.4–1.2)
Glucose, Bld: 104 mg/dL — ABNORMAL HIGH (ref 70–99)
Total Protein: 7.3 g/dL (ref 6.0–8.3)

## 2010-10-27 LAB — DIFFERENTIAL
Basophils Absolute: 0 10*3/uL (ref 0.0–0.1)
Basophils Relative: 1 % (ref 0–1)
Lymphocytes Relative: 42 % (ref 12–46)
Monocytes Absolute: 0.5 10*3/uL (ref 0.1–1.0)
Monocytes Relative: 11 % (ref 3–12)
Neutro Abs: 2.1 10*3/uL (ref 1.7–7.7)
Neutrophils Relative %: 43 % (ref 43–77)

## 2010-10-27 LAB — ANTIPHOSPHOLIPID SYNDROME EVAL, BLD
Anticardiolipin IgG: <7 [GPL'U] — ABNORMAL LOW (ref <11)
Antiphosphatidylserine IgA: <20 APS U/mL (ref <20.0)
DRVVT: 40.2 secs (ref 36.1–47.0)
Lupus Anticoagulant: NOT DETECTED
PTTLA 4:1 Mix: 47.1 secs (ref 36.3–48.8)

## 2010-10-27 LAB — LIPID PANEL
HDL: 55 mg/dL (ref >39)
LDL Cholesterol: 176 mg/dL — ABNORMAL HIGH (ref 0–99)
Triglycerides: 168 mg/dL — ABNORMAL HIGH (ref <150)
VLDL: 34 mg/dL (ref 0–40)

## 2010-10-27 LAB — URINALYSIS, ROUTINE W REFLEX MICROSCOPIC
Bilirubin Urine: NEGATIVE
Hgb urine dipstick: NEGATIVE
Nitrite: NEGATIVE
Specific Gravity, Urine: 1.011 (ref 1.005–1.030)
Urobilinogen, UA: 0.2 mg/dL (ref 0.0–1.0)
pH: 5.5 (ref 5.0–8.0)

## 2010-10-27 LAB — URINE MICROSCOPIC-ADD ON

## 2010-10-27 LAB — ANA: Anti Nuclear Antibody(ANA): NEGATIVE

## 2010-12-01 NOTE — H&P (Signed)
NAME:  Ashley Cruz, BERGEVIN              ACCOUNT NO.:  [id number]   MEDICAL RECORD NO.:  [id number]          PATIENT TYPE:  INP   LOCATION:  3712                         FACILITY:  [facility]   PHYSICIAN:  Sanda Linger, MD       DATE OF BIRTH:  1945/12/28   DATE OF ADMISSION:  11/23/2008  DATE OF DISCHARGE:                              HISTORY & PHYSICAL   PRIMARY CARE PHYSICIAN:  Corwin Levins, MD   CARDIOLOGIST:  Everardo Beals. Juanda Chance, MD, [facility]   CHIEF COMPLAINT:  She had an episode of dizziness, confusion, and fever  approximately 9 a.m. today.   HISTORY OF PRESENT ILLNESS:  This is a 65 year old female with no  significant medical history, however, she was found in her house this  morning by her husband to be confused and inappropriate.  Husband said  had left the house to run some errands and has came back to pick her up  at 9 o'clock to go to her grandchild's birthday party.  He said he found  all the doors in the house open and he found her sitting staring at the  TV and she seemed inappropriate and forgetful.  She said she fell to the  time that she was having an out-of-body experience and that she felt  like she was asleep.  She went on to go to her grandchild's birthday  party because she felt that that was important and then her husband  brought to the emergency room for evaluation and treatment.  She has a  history of migraine headaches, but has not recently had a headache and  said she does not think she has taken Imitrex for about 4-6 weeks.  She  did describe it about a year ago where she had a vision loss in the left  eye and she and her neurologist felt that it was an ocular migraine.  That eventually resolved.  She said that with the event today, she had  no paresthesias in her face, arms, or legs.  There was no notice of  slurred speech or ataxia and she did not have any difficulty using her  arms or legs.  She does report a chronic history of palpitations and  said she has  been evaluated by Dr. Charlies Constable and had a normal cardiac  cath; however, her echocardiogram showed mitral valve prolapse.  She  also has a history of vasovagal syncope, but has not recently had an  event like that.  She was feeling well prior to this acute event.  Her  palpitations have not even more severe or less prominent than  previously.  She describes the palpitations as heart racing and  sometimes flip-flopping.  She does not experience chest pain or  shortness of breath, and has no dyspnea on exertion, edema, or fatigue.   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  She had to have 1 on the right removed because it was septic.  She has  a history of hypercholesterolemia, but does not tolerate the statins.  She has a history of  mitral valve prolapse.  History of vasovagal  syncope.  She was recently seen in the office for left foot swelling and  right foot pain.   SOCIAL HISTORY:  She is with her husband.  She does not report any  significant abuse of alcohol and does not smoke cigarettes or use  illicit drugs.  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   FAMILY HISTORY:  Negative for any cerebrovascular accidents.   CURRENT MEDICATIONS:  She has not taken Imitrex for about 4-6 weeks.  She occasionally takes Advil Cold and Sinus for allergy symptoms.   ALLERGIES:  She is allergic to CODEINE.   REVIEW OF SYSTEMS:  Otherwise, unremarkable.   PHYSICAL EXAMINATION:  NEUROLOGIC:  She is an alert, pleasant female.  She does appear forgetful and occasionally has word-finding difficulty,  but she is otherwise alert and oriented to person, place, time, and  situation.  She demonstrates normal gait, speech, and stance.  VITAL SIGNS:  Temperature is 97.2.  Her blood pressures in the emergency  room are 140/69, 123/56.  Her  pulse is 66 and 75 and regular,  respiratory rate is 18-20 and unlabored, pulse ox 98-99% on room air.  HEENT:  Her pupils are about 3 mm bilaterally.  There are equal reactive  to light and accommodation.  Fundi are not visible.  Extraocular  movements are intact without nystagmus.  She has no facial asymmetry and  her cranial nerves are intact.  Nasopharynx, oropharynx, posterior  pharynx have no lesions or exudate.  NECK:  Supple with full range of motion with no limb adenopathy or JVD.  LUNGS:  Clear anteriorly and posteriorly.  CARDIOVASCULAR:  Regular rhythm without murmur, rub, or gallop.  ABDOMEN:  Mildly obese, but there is no hepatosplenomegaly, mass, or  tenderness to palpation.  EXTREMITIES: No cyanosis, clubbing, or edema.  NEUROLOGIC:  She feels like there is a drift in her left upper  extremity, but there is none visible during examination.  Her Romberg  test and station are normal.  She has strength in the upper and lower  extremities, it is diffusely 4+/5.  In the upper extremities, her deep  tendon reflexes were trace and symmetrical in bilateral biceps, triceps,  and brachioradialis.  Deep tendon reflex in the left patella is 1+  brisker than on the right, which is trace.  There are no detectable  reflexes in the Achilles.  Her Babinski responses are equivocal in both  feet.  Her cerebellar testing is normal to heel-to-shin and rapid  alternating movement.  Her pulses are 2+ and equal throughout.  Her mood  is appropriate and calm.   LABORATORY DATA:  Her CBC shows a white count of 4.8, hemoglobin 13.9,  hematocrit 40.8, and platelet count of 260.  Her comprehensive metabolic  panel shows a slightly elevated glucose at 104, otherwise within normal  limits.  LFTs are normal.  Her urine analysis shows trace leukocyte  esterase, otherwise unremarkable.   Scans in the ER; initially, a CT scan was done and it is reporting as  having multiple areas of low density in the  frontal white matter  bilaterally.  There is also a focal low density area in subinsular white  matter lesion on the left.  There are mild diffuse periventricular white  matter hypodensities in the parietal white matter bilaterally.  She went  on to have an MRI of the brain without contrast.  This showed findings  consistent with this CT scan and that she has multiple subcortical white  matter hyperintensities bilaterally.  These are widely distributed in  the frontal parietal white matter.  It was confirmed that there is a  lesion in the subinsular white matter on the left.  The brainstem and  posterior fossa are normal.  There is no evidence of hemorrhage or mass.  The diffusion weighted images show that there is a small possible acute  infarct in the splenium of the corpus callosum on the left.  While she  was in the emergency room, a phone consult was obtained with Dr. Noel Christmas of Neurology.  He requested that she be admitted to Internal  Medicine, and he would consult.   ASSESSMENT:  1. Cerebrovascular accident in what appears to be a complex scenario      with multiple abnormalities on the MRI.  2. Abnormal MRI with changes consistent with white matter disease      versus  MS versus other etiologies.  3. History of hypercholesterolemia.  4. Mild elevation in blood pressure.   PLAN:  1. Admit on an inpatient status for telemetry monitoring to rule out      atrial fibrillation or atrial flutter regarding palpitations.  2. Antiplatelet therapy with high-dose aspirin and anticoagulate with      subcu Lovenox.  3. Neurology consult with Dr. Noel Christmas.  4. Start ACE inhibitor and statin to reduce the risk of a second CVA.  5. Physical Therapy to assist with any residual from the CVA.       Sanda Linger, MD  Electronically Signed     TJ/MEDQ  D:  11/23/2008  T:  11/24/2008  Job:  [id number]   cc:   Corwin Levins, MD  Everardo Beals. Juanda Chance, MD, [facility]

## 2010-12-01 NOTE — Consult Note (Signed)
NAME:  Ashley Cruz, Ashley Cruz              ACCOUNT NO.:  [id number]   MEDICAL RECORD NO.:  [id number]          PATIENT TYPE:  INP   LOCATION:  3712                         FACILITY:  [facility]   PHYSICIAN:  Noel Christmas, MD    DATE OF BIRTH:  1945/07/20   DATE OF CONSULTATION:  11/23/2008  DATE OF DISCHARGE:                                 CONSULTATION   REFERRING PHYSICIAN:  [facility].   REASON FOR CONSULTATION:  Possible acute stroke, presenting as transient  confusion.   HISTORY OF PRESENT ILLNESS:  This is a 65 year old lady who was found by  husband to be confused after returning home this morning after being  away for a short while.  The patient was not aware that the doors were  open and the animals that typically stayed outside were inside the house  (cats).  The patient assumed that she was asleep and her husband woke  her up.  However, husband says she was wide awake and apparently  watching television.  The patient was not aware that the cats had gotten  into the house.  In addition, the patient complained of feeling as if  she were distant from her surroundings and was somewhat in a fog.  Husband indicated that she asked the same questions over and over even  though he continued to repeatedly give her correct answers.  No focal  weakness was noted.  Speech was only minimally slurred.  CT scan of her  head was obtained from the emergency room which showed diffuse white  matter changes, which were nonspecific, but suggestive possible  demyelinating type disease.  An MRI was recommended.  MRI was obtained  which showed possible acute small infarction involving the splenium of  the corpus callosum.  Study was otherwise unremarkable for acute  findings.  The patient has no history of stroke or TIA.  She has not  been on antiplatelet therapy.  Symptoms cleared over several hours and  have not recurred.  There is no clear history of symptoms of this type  at any time in  the past.   The patient also had MRA of her brain which showed no significant  atherosclerotic nor occlusive disease otherwise.   PAST MEDICAL HISTORY:  Remarkable for:  1. Mitral valve prolapse.  2. Noncardiac chest pain.  3. Mild hyperlipidemia.  4. Migraine headaches.   CURRENT MEDICATIONS:  Imitrex 50 mg p.r.n.   FAMILY HISTORY:  Noncontributory.   PHYSICAL EXAMINATION:  GENERAL:  Appearance was that of a middle-aged  lady who was slightly overweight.  She was alert and cooperative and in  no acute distress.  She was well oriented to time as well as place.  Short-term and long-term memory were normal.  Affect was appropriate for  the most part.  HEENT:  Pupils were equal, reacting normally to light.  Extraocular  movements were fully conjugate.  NEUROLOGIC:  Visual fields were intact and normal.  There was no facial  weakness, no facial numbness.  Hearing was normal.  Speech and palatal  movement were normal.  Strength and muscle tone  were normal throughout.  Deep tendon reflexes were normal and symmetrical.  Plantar responses  were flexor.  Sensory examination was normal.  Carotid auscultation was  normal.   CLINICAL IMPRESSION:  Etiology for the patient's transient confusion is  unclear at this point.  Symptoms may be related to possible acute small  stroke involving splenium of corpus callosum.  However, complex partial  seizure disorder of new onset cannot be rule out.   RECOMMENDATIONS:  1. Aspirin 325 mg per day.  2. MRA of the neck with contrast.  3. EEG on Nov 25, 2008.   Thank you for asking me to evaluate Ms. Laughery.      Noel Christmas, MD  Electronically Signed     CS/MEDQ  D:  11/23/2008  T:  11/24/2008  Job:  [id number]

## 2010-12-01 NOTE — Assessment & Plan Note (Signed)
[facility]                            CARDIOLOGY OFFICE NOTE   Ashley Ashley, Ashley                       MRN:          [id number]  DATE:04/04/2008                            DOB:          [birth year]    REFERRING PHYSICIAN:  Hedwig Morton. Juanda Chance, MD   PRIMARY CARE PHYSICIAN:  Dr. Brooke Bonito in [facility].   REASON FOR REFERRAL:  Evaluation of chest pain.   CLINICAL HISTORY:  Ashley Ashley Ashley Ashley Ashley) has a history of chest  pain for a number of years.  She was evaluated 10-15 years ago in [facility] with a nuclear scan.  At that time, she was told that she might  have a slight problem with one of her heart valves.  She has continued  to have some symptoms of chest pain since that time, but recently they  have gotten worse and had become more related to exertion.  She says it  is fairly predictable now that when she walks up a hill, she will get  tight in her chest and have to stop.  She has shortness of breath and  palpitations with this, but no nausea or diaphoresis.  She says she has  not had much in the way of chest pain at rest.  She does give out much  more easily and is more fatigued than she was before.   PAST MEDICAL HISTORY:  Significant for hyperlipidemia.  She has no  history of diabetes or hypertension.  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   CURRENT MEDICATIONS:  1. Crestor 10 mg every other day.  2. Multivitamins.   SOCIAL HISTORY:  She works in as a Financial trader for  [employer].  She is married and has children and grandchildren.  She  does not smoke.   FAMILY HISTORY:  Positive for vascular disease and her mother had  bilateral carotid endarterectomy.  She was a smoker and died at the age  64 of emphysema.  Her father died at the age 70 of cancer.   REVIEW OF SYSTEMS:  Positive for fatigue, some anxiety, and some  increased stress.   PHYSICAL EXAMINATION:  VITAL SIGNS:  Today, the blood pressure was  135/68 and pulse 71 and regular.  NECK:  There was no venous distension.  The carotid pulses were full  without bruits.  CHEST:  Clear without rales or rhonchi.  HEART:  Rhythm was regular.  The heart sounds were normal and no murmurs  or gallops.  ABDOMEN:  Soft without organomegaly.  EXTREMITIES:  Peripheral pulses were full and there was no peripheral  edema.  MUSCULOSKELETAL:  No deformities.  SKIN:  Warm and dry.  NEUROLOGIC:  No focal neurologic signs.   An electrocardiogram was normal.   IMPRESSION:  1. Exertional chest pain with some features suggestive of angina.  2. Hyperlipidemia.  3. Positive family history for vascular disease.   RECOMMENDATIONS:  Ashley Ashley Ashley symptoms are somewhat suggestive of  ischemia and she has  a moderate risk profile.  I think, she needs  further evaluation.  I discussed options of a stress nuclear study  versus a cardiac catheterization.  I think in view of her symptoms, a  catheterization will be more definitive and I would stay with that.  I  discussed this with her and her husband and they are agreeable to this.  We will also get an echocardiogram to rule out other structural heart  disease.  We will schedule this tomorrow and scheduled the followup in a  few weeks on her data and we will get a fasting lipid profile when she  comes in for her echocardiogram.   Her husband works in the restorations and was with her today.     Bruce Elvera Lennox Juanda Chance, MD, [facility]  Electronically Signed    BRB/MedQ  DD: 04/04/2008  DT: 04/04/2008  Job #: [id number]

## 2010-12-01 NOTE — Procedures (Signed)
EEG NUMBER:  [id number].   REFERRING PHYSICIAN:  Rosalyn Gess. Norins, MD of [place].   The study was established on Nov 26, 2007 with the ID number [id number] at  room 37.   This is a routine EEG for the patient who was described as awake, right-  handed 65 year old female individual admitted due to acute mental status  changes.  The patient stated that she felt disconnected and somewhat in  a fog  at the time of her admission.  Activating procedures were included in the 16-channel EEG recording ,  with one channel representing heart rate and rhythm exclusively, and  hyperventilation and photic stimulation.   MEDICATIONS CURRENTLY:  Lovenox, Altace, Crestor, and aspirin.   A posterior dominant background rhythm was established, but the  patient's EEG shows unusual low amplitude.  A 9-Hz rhythm was  established and promptly attenuated with eye opening.  With photic  stimulation, there was eye blink artifact seen, but no epileptiform  activity resulted.  An entrainment was noted up to very high frequencies of 15 and 17 Hz.  The eye blink artifact continued after the patient was no longer exposed  to the photic stimulation maneuvers.  The EKG shows a normal sinus rhythm at 62 beats per minute.  Hyperventilation did lead to some amplitude buildup, but the expected  intermittent generalized slowing was not present.  Epileptiform activity did not result.   CONCLUSION:  This is a normal EEG for the patient's age and conscious  state.       Melvyn Novas, M.D.  Electronically Signed     ZO:XWRU  D:  11/25/2008 15:46:46  T:  11/26/2008 06:35:50  Job #:  [id number]   cc:   Rosalyn Gess. Norins, MD  [address]  [place]  [place] [address]

## 2010-12-01 NOTE — Cardiovascular Report (Signed)
NAME:  Ashley Cruz, JAUREGUI              ACCOUNT NO.:  [id number]   MEDICAL RECORD NO.:  [id number]          PATIENT TYPE:  OIB   LOCATION:  1965                         FACILITY:  [facility]   PHYSICIAN:  Everardo Beals. 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  She has a long history of chest pain,  which has become worse recently.  She describes the pain as a substernal  pain that occurs fairly predictably with exertion.  She does have a  family history of vascular disease and does have hyperlipidemia.  I saw  her yesterday in consultation and we arranged for her to come in for  angiography today.   PROCEDURE:  The procedure was followed by the right femoral artery and  arterial sheath and 6-French preformed coronary catheters.  A front-wall  arterial puncture was performed and Omnipaque contrast was used.  The  patient tolerated the procedure well and left laboratory in satisfactory  condition.   RESULTS:  LEFT MAIN CORONARY ARTERY.  The left main coronary artery is  free of significant disease.   LEFT ANTERIOR DESCENDING ARTERY.  The left anterior descending artery  gave rise to 3  diagonal branches and 2 septal perforators.  These in  the LAD proper were free of significant disease.   CIRCUMFLEX ARTERY.  The circumflex artery gave rise to a ramus branch, a  marginal branch, and posterolateral branch.  These vessels were free of  significant disease.   RIGHT CORONARY ARTERY.  The right coronary artery is a moderate-sized  vessel and gave rise to 2 ventricle branches, a posterior descending  branch, and a posterolateral branch.  There was 30% narrowing of the  proximal right coronary artery.   The left ventriculogram.  The left ventriculogram was performed on the  RAO  projection shows good wall motion with no areas of hypokinesis.  Estimated fraction was 60%.  The mitral valve was slightly furled but  there was no definite prolapse or regurgitation.    The aortic pressure was 159/88 with a mean of 119 and left ventricle  pressure was 159/22.   CONCLUSION:  Minimal nonobstructive coronary artery disease with no  significant obstruction in the LAD and circumflex arteries, 30%  narrowing of the proximal right coronary artery and normal LV function.   RECOMMENDATIONS:  Reassurance.  I am still not certain regarding the  etiology of the patient's symptoms.  She has a history of questionable  valve abnormality and there is some slight filling of her valve on  angiography and she  could have mitral valve prolapse, although I doubt her echo will meet  diagnostic criteria for this.  We will plan to evaluate it with an echo.  She possibly could have microvascular angina since her symptoms are  exertionaland all.  Consider the possibility of doing a stress ECG to  see if she meets criteria for that.  Bruce Elvera Lennox Juanda Chance, MD, [facility]  Electronically Signed     BRB/MEDQ  D:  04/05/2008  T:  04/06/2008  Job:  [id number]   cc:   Hedwig Morton. Juanda Chance, MD  Brooke Bonito

## 2010-12-01 NOTE — Assessment & Plan Note (Signed)
[facility]                            CARDIOLOGY OFFICE NOTE   JUNI, GLAAB                       MRN:          [id number]  DATE:06/10/2008                            DOB:          October 27, 1945    PRIMARY CARE PHYSICIAN:  Dr. Brooke Bonito in [facility].   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  I have evaluated her for a chest pain and she had cardiac  catheterization which was normal and she had an echocardiogram which  showed mitral valve prolapse.  She has had persistent symptoms of chest  pain and palpitations and fatigue.  We tried her on Toprol, but she felt  worse and felt weaker on this until she stopped it.  She has had not  been able to be very active because she gets chest pain and  palpitations.   PAST MEDICAL HISTORY:  Significant for hyperlipidemia.  She was on  Crestor, which she stopped this because she thought she might be causing  symptoms of fatigue.  She is currently on no medications.   PHYSICAL EXAMINATION:  Blood pressure is 133/76 and pulse 61 and  regular.  There was no venous distention.  The carotid pulses were full  without bruits.  Chest was clear.  Cardiac rhythm was regular.  I could  hear no murmurs or gallops.  The abdomen was soft with normal bowel  sounds.  Peripheral pulses were full.  There was no peripheral edema.   IMPRESSION:  1. Chest pain, palpitations, and fatigue.  2. Mitral valve prolapse.  3. Hyperlipidemia with intolerance to CRESTOR.   RECOMMENDATIONS:  Rwanda has gotten into a vicious cycle of decreased  exercise due to symptoms of chest pain, palpitations, and fatigue, and  then increased deconditioning.  I had encouraged her that in general  exercise is safe with mitral valve prolapse and if she has symptoms, she  needs to back off, but she should try again and hopefully over time  these will improve.  Without this, she will become more  deconditioned.  We are going to bring her back in about 2 weeks for standard stress test  to see if we can document her exercise tolerance, see if there are any  arrhythmias with exercise, and see if there are any ST-segment changes  with exercise which might be any indication of microvascular angina.  After this, we will hopefully be able to give her some guidelines about  exercise and get her into a regular program.     Bruce R. Juanda Chance, MD, [facility]  Electronically Signed    BRB/MedQ  DD: 06/10/2008  DT: 06/11/2008  Job #: [id number]

## 2010-12-01 NOTE — Assessment & Plan Note (Signed)
[facility]                            CARDIOLOGY OFFICE NOTE   [name], [name]                       MRN:          [id number]  DATE:04/17/2008                            DOB:          1945/12/18    PRIMARY CARE PHYSICIAN:  Dr. Brooke Bonito in [facility].   CLINICAL HISTORY:  Ashley Cruz returned for a followup office visit  after a recent catheterization.  I saw her with symptoms of chest pain,  palpitations and fatigue and reevaluated with angiography which showed  no evidence of a coronary disease with no visible plaque and good LV  function.  She subsequently had an echocardiogram which showed some  prolapse of the posterior leaf of the mitral valve.  She has continued  to have symptoms.  She gets fatigued and palpitations fairly easily with  less activity.  She has become much less active over the last several  months.  She did have a history of previous mitral valve prolapse many  years ago.   PAST MEDICAL HISTORY:  Significant for hyperlipidemia.  She was on  Crestor, but stopped this after a catheterization because she thought it  might be causing some of her symptoms and she has felt somewhat better  since then.   Her current medications include multivitamins and previously Crestor 5  mg daily.   PHYSICAL EXAMINATION:  VITAL SIGNS:  Today, the blood pressure is 124/73  and the pulse is 65 and regular.  NECK:  There was no venous distension.  The carotid pulses were full  without bruits.  CHEST:  Clear.  CARDIAC:  Rhythm was regular.  There were no murmurs or gallops.  ABDOMEN:  Soft with normal bowel sounds.  EXTREMITIES:  Peripheral pulses were full with no peripheral edema.   ECG was normal.   IMPRESSION:  1. Chest pain, palpitations and fatigue.  2. Mitral valve prolapse.  3. Hyperlipidemia.   RECOMMENDATIONS:  I suspect that [name]'s symptoms are a combination of  mitral valve prolapse and some deconditioning.  I  think we may be able  to help some with a beta-blocker and will start her Toprol-XL 25 mg a  day.  Also, I urged her to get into some regular exercise since I think  much of her problem is related to deconditioning.  She is planning to do  some regular aerobic exercise.  I will plan to see her back in about 8  weeks to follow up on these findings.  Her TSH was borderline, and I  will repeat her TSH in 8 weeks as well as lipid profile  off the Crestor.  We may consider putting her back on Crestor every  other day following at the time of her next visit.     Bruce Elvera Lennox Juanda Chance, MD, [facility]  Electronically Signed    BRB/MedQ  DD: 04/17/2008  DT: 04/18/2008  Job #: [id number]   cc:   Hedwig Morton. Juanda Chance, MD

## 2010-12-01 NOTE — Assessment & Plan Note (Signed)
[facility]                            CARDIOLOGY OFFICE NOTE   [name], [name]                       MRN:          [id number]  DATE:07/09/2008                            DOB:          [birth year]    Ashley Cruz was able to exercise 7 minutes of a modified Bruce protocol and  achieved a heart rate of 136, at which time, the test was terminated.  Resting echocardiogram was normal.  There was no significant arrhythmias  or ST-segment changes during her post exercise.  EKG showed no  significant ST-segment changes.  This was interpreted as a negative  exercise test with borderline exercise tolerance.   I discussed with Ashley Cruz the results of this test.  She has mitral valve  prolapse, and some of her symptoms may be related to this.  This has  gotten her into a vicious cycle of decreased exercise and decreased  conditioning.  I encouraged her to try and get more regular exercise and  that she does not need to be afraid of precipitating any serious cardiac  problem.   She is under a great deal of stress and we made a referral to Dr.  Dellia Cloud to help her deal with a stressful situation.  I am dictating  this dictation after the fax.  I am not certain how soon I gave her a  followup return office visit.     Bruce Elvera Lennox Juanda Chance, MD, [facility]  Electronically Signed    BRB/MedQ  DD: 07/23/2008  DT: 07/24/2008  Job #: [id number]

## 2010-12-04 NOTE — Discharge Summary (Signed)
NAME:  Ashley Cruz, TROCHEZ              ACCOUNT NO.:  [id number]   MEDICAL RECORD NO.:  [id number]          PATIENT TYPE:  INP   LOCATION:  3712                         FACILITY:  [facility]   PHYSICIAN:  Barbette Hair. Artist Pais, DO      DATE OF BIRTH:  14-Jun-1946   DATE OF ADMISSION:  11/23/2008  DATE OF DISCHARGE:  11/26/2008                               DISCHARGE SUMMARY   FOLLOWUP INSTRUCTIONS:  She was advised to follow up with Dr. Pearlean Brownie.  She is to call for an appointment within 1 month.   DISCHARGE MEDICATIONS:  1. Aspirin 325 mg once daily.  2. Lipitor 40 mg once daily.  3. Imitrex 50 mg once daily as needed.   DISCHARGE DIAGNOSIS:  1. Transient confusion, possibly secondary to transient ischemic      attack.  2. Hyperlipidemia.  3. History of migraine headache.  4. History of depression.   HOSPITAL COURSE:  The patient is a 65 year old white female who was  brought into the ER by her husband due to confusion and acting  inappropriately.  He noted she seemed unusually forgetful.  She also  notes having out-of-body experience and that she felt like she was  asleep.  She reported history of migraine headaches but had not had  recent headache for 4-6 weeks.  She had seen a neurologist in the past  for possible ocular migraines.  There was no report of paresthesias in  her face, arms, her legs.  There was no mention of slurred speech or  ataxia.  The patient noted to have chronic history of palpitations and  was evaluated by [facility], Dr. Charlies Constable.  She had normal  cardiac cath.  Echocardiogram notable for mitral valve prolapse.  The  patient was admitted for possible stroke workup.  An MRI/MRA was  obtained.  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  Possible  small subacute infarct splenium of corpus callosum.  The MRA was negative for cerebral aneurysm.  She was seen by Neurology  who also performed EEG.  EEG negative for epileptiform  activity.   The patient also had transcranial Doppler.  It noted normal flow,  direction of velocity in all identifying vessels of the anterior and  posterior circulations, with no evidence of stenosis, vasospasm or  occlusion.  No evidence of intracranial disease noted.  Carotid Dopplers  were also obtained.  There was no evidence of significant plaque noted  in the ICA.  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   The patient's transient confusion returned to baseline.  There was no  ataxia or dysarthria.  Neurology felt she may have had possible TIA  versus complicated migraine.  Dr. Pearlean Brownie did not feel the corpus callosum  lesion was acute.  He recommended aspirin for stroke prevention and  start of statin therapy.   LABORATORY DATA:  CBC on Nov 23, 2008, showed WBC 4.8, H and H of 13.9  and 48.8, platelet count 260,000.  Sed rate was 9.  Lupus anticoagulant  panel was negative.  Basic metabolic profile, sodium  142, potassium 4.3,  chloride 109, CO2 27, glucose 104, BUN 14, creatinine of 0.82.  LFTs  were unremarkable.  Total cholesterol 265, triglycerides 168, LDL 176,  and HDL of 55.  ANA screen negative.  Antiphospholipid titers negative.   CONDITION ON DISCHARGE:  The patient's mental status had returned to  normal.  She was felt medically stable for discharge.      Barbette Hair. Artist Pais, DO  Electronically Signed     Barbette Hair. Artist Pais, DO  Electronically Signed    RDY/MEDQ  D:  01/22/2009  T:  01/23/2009  Job:  [id number]   cc:   Corwin Levins, MD

## 2011-03-01 ENCOUNTER — Emergency Department (HOSPITAL_COMMUNITY)
Admission: EM | Admit: 2011-03-01 | Discharge: 2011-03-02 | Disposition: A | Discharge status: Home / Self Care | Payer: Managed Care, Other (non HMO) | Attending: Emergency Medicine | Admitting: Emergency Medicine

## 2011-03-01 ENCOUNTER — Emergency Department (HOSPITAL_COMMUNITY): Payer: Managed Care, Other (non HMO)

## 2011-03-01 DIAGNOSIS — F411 Generalized anxiety disorder: Secondary | ICD-10-CM | POA: Insufficient documentation

## 2011-03-01 DIAGNOSIS — Z7982 Long term (current) use of aspirin: Secondary | ICD-10-CM | POA: Insufficient documentation

## 2011-03-01 DIAGNOSIS — R5383 Other fatigue: Secondary | ICD-10-CM | POA: Insufficient documentation

## 2011-03-01 DIAGNOSIS — H81399 Other peripheral vertigo, unspecified ear: Secondary | ICD-10-CM | POA: Insufficient documentation

## 2011-03-01 DIAGNOSIS — Z79899 Other long term (current) drug therapy: Secondary | ICD-10-CM | POA: Insufficient documentation

## 2011-03-01 DIAGNOSIS — H5789 Other specified disorders of eye and adnexa: Secondary | ICD-10-CM | POA: Insufficient documentation

## 2011-03-01 DIAGNOSIS — I10 Essential (primary) hypertension: Secondary | ICD-10-CM | POA: Insufficient documentation

## 2011-03-01 DIAGNOSIS — H1130 Conjunctival hemorrhage, unspecified eye: Secondary | ICD-10-CM | POA: Insufficient documentation

## 2011-03-01 DIAGNOSIS — I2510 Atherosclerotic heart disease of native coronary artery without angina pectoris: Secondary | ICD-10-CM | POA: Insufficient documentation

## 2011-03-01 DIAGNOSIS — R55 Syncope and collapse: Secondary | ICD-10-CM | POA: Insufficient documentation

## 2011-03-01 DIAGNOSIS — R5381 Other malaise: Secondary | ICD-10-CM | POA: Insufficient documentation

## 2011-03-01 DIAGNOSIS — Z8673 Personal history of transient ischemic attack (TIA), and cerebral infarction without residual deficits: Secondary | ICD-10-CM | POA: Insufficient documentation

## 2011-03-01 LAB — CBC
HCT: 42.7 % (ref 36.0–46.0)
MCH: 30.5 pg (ref 26.0–34.0)
MCV: 93 fL (ref 78.0–100.0)
RBC: 4.59 MIL/uL (ref 3.87–5.11)
RDW: 13.9 % (ref 11.5–15.5)
WBC: 7.5 10*3/uL (ref 4.0–10.5)

## 2011-03-01 LAB — DIFFERENTIAL
Eosinophils Relative: 3 % (ref 0–5)
Lymphocytes Relative: 44 % (ref 12–46)
Lymphs Abs: 3.3 10*3/uL (ref 0.7–4.0)
Monocytes Relative: 6 % (ref 3–12)

## 2011-03-01 LAB — CK TOTAL AND CKMB (NOT AT ARMC)
CK, MB: 3.1 ng/mL (ref 0.3–4.0)
Total CK: 84 U/L (ref 7–177)

## 2011-03-01 LAB — COMPREHENSIVE METABOLIC PANEL
BUN: 24 mg/dL — ABNORMAL HIGH (ref 6–23)
CO2: 27 mEq/L (ref 19–32)
Chloride: 103 mEq/L (ref 96–112)
Creatinine, Ser: 0.92 mg/dL (ref 0.50–1.10)
GFR calc non Af Amer: >60 mL/min (ref >60)
Total Bilirubin: 0.2 mg/dL — ABNORMAL LOW (ref 0.3–1.2)

## 2011-03-01 LAB — POCT I-STAT TROPONIN I

## 2011-03-10 ENCOUNTER — Ambulatory Visit (INDEPENDENT_AMBULATORY_CARE_PROVIDER_SITE_OTHER): Payer: Managed Care, Other (non HMO) | Admitting: Cardiovascular Disease

## 2011-03-10 ENCOUNTER — Encounter: Payer: Self-pay | Admitting: Cardiovascular Disease

## 2011-03-10 DIAGNOSIS — I059 Rheumatic mitral valve disease, unspecified: Secondary | ICD-10-CM

## 2011-03-10 DIAGNOSIS — E785 Hyperlipidemia, unspecified: Secondary | ICD-10-CM

## 2011-03-10 DIAGNOSIS — I6350 Cerebral infarction due to unspecified occlusion or stenosis of unspecified cerebral artery: Secondary | ICD-10-CM

## 2011-03-10 DIAGNOSIS — R0602 Shortness of breath: Secondary | ICD-10-CM

## 2011-03-10 DIAGNOSIS — I341 Nonrheumatic mitral (valve) prolapse: Secondary | ICD-10-CM

## 2011-03-10 DIAGNOSIS — F411 Generalized anxiety disorder: Secondary | ICD-10-CM

## 2011-03-10 NOTE — Assessment & Plan Note (Signed)
No carotid disease.  F/U Sethi ? Further w/u of demylenating disease

## 2011-03-10 NOTE — Assessment & Plan Note (Signed)
Seems to somatisize a lot of her symptoms.  F/U with neuro and primary

## 2011-03-10 NOTE — Assessment & Plan Note (Signed)
Cholesterol is at goal.  Continue current dose of statin and diet Rx.  No myalgias or side effects.  F/U  LFT's in 6 months. Lab Results  Component Value Date   LDLCALC 96 02/13/2009

## 2011-03-10 NOTE — Assessment & Plan Note (Signed)
No evidence of true Barlows syndrome.  No murmur on exam . Echo

## 2011-03-10 NOTE — Progress Notes (Signed)
65 yo previously seen by Dr. Juanda Chance.  Significant anxiety, depression and cognitive difficulties.  Multiple somatic complaints in past and present never clearly related to heart.  She identifies herself as having significant MVP.  Reviewed echos from 2009 and 2010 and no prolapse only mild MR.  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  Reviewed cath 03/2008 normal cors only 30% RCA.  Presyncope with diaphoresis but no postural signs ever documents.  ? History of TIA with MRI showing white matter disease ? Demylenation, small vessel disease.  History of migraines.  Reassured patient about her heart.  Dont think her somatic complaints of dyspnea, presyncope, SSCP have any cardiac etiology.    ROS: Denies fever, malais, weight loss, blurry vision, decreased visual acuity, cough, sputum,  hemoptysis, pleuritic pain, palpitaitons, heartburn, abdominal pain, melena, lower extremity edema, claudication, or rash.  All other systems reviewed and negative  General: Affect appropriate Healthy:  appears stated age HEENT: normal Neck supple with no adenopathy JVP normal no bruits no thyromegaly Lungs clear with no wheezing and good diaphragmatic motion Heart:  S1/S2 no murmur,rub, gallop or click PMI normal Abdomen: benighn, BS positve, no tenderness, no AAA no bruit.  No HSM or HJR Distal pulses intact with no bruits No edema Neuro non-focal Skin warm and dry No muscular weakness   Current Outpatient Prescriptions  Medication Sig Dispense Refill  . atorvastatin (LIPITOR) 40 MG tablet Take 40 mg by mouth daily.        . meclizine (ANTIVERT) 25 MG tablet Take 25 mg by mouth 3 (three) times daily as needed.        . SUMAtriptan (IMITREX) 50 MG tablet Take 50 mg by mouth every 2 (two) hours as needed.          Allergies  Review of patient's allergies indicates no known allergies.  Electrocardiogram:  NSR 75 poor R wave progression from lead placement Othewise normal  Assessment and Plan

## 2011-03-10 NOTE — Patient Instructions (Signed)
Your physician has requested that you have an echocardiogram. Echocardiography is a painless test that uses sound waves to create images of your heart. It provides your doctor with information about the size and shape of your heart and how well your heart's chambers and valves are working. This procedure takes approximately one hour. There are no restrictions for this procedure.   

## 2011-03-11 ENCOUNTER — Ambulatory Visit (HOSPITAL_COMMUNITY): Payer: Managed Care, Other (non HMO) | Attending: Cardiovascular Disease | Admitting: Radiology

## 2011-03-11 DIAGNOSIS — R0989 Other specified symptoms and signs involving the circulatory and respiratory systems: Secondary | ICD-10-CM | POA: Insufficient documentation

## 2011-03-11 DIAGNOSIS — E785 Hyperlipidemia, unspecified: Secondary | ICD-10-CM | POA: Insufficient documentation

## 2011-03-11 DIAGNOSIS — R0609 Other forms of dyspnea: Secondary | ICD-10-CM | POA: Insufficient documentation

## 2011-03-11 DIAGNOSIS — I079 Rheumatic tricuspid valve disease, unspecified: Secondary | ICD-10-CM | POA: Insufficient documentation

## 2011-03-11 DIAGNOSIS — I059 Rheumatic mitral valve disease, unspecified: Secondary | ICD-10-CM | POA: Insufficient documentation

## 2011-03-11 DIAGNOSIS — R55 Syncope and collapse: Secondary | ICD-10-CM | POA: Insufficient documentation

## 2011-03-11 DIAGNOSIS — R0602 Shortness of breath: Secondary | ICD-10-CM

## 2011-03-11 DIAGNOSIS — I341 Nonrheumatic mitral (valve) prolapse: Secondary | ICD-10-CM

## 2011-03-15 NOTE — Progress Notes (Signed)
pt aware of results Ashley Cruz  

## 2011-05-19 ENCOUNTER — Telehealth: Payer: Self-pay | Admitting: *Deleted

## 2011-05-19 NOTE — Telephone Encounter (Signed)
Spoke with patient. She wants to start disability process and didn't know where to start. C/o hist of TIA, white matter disease, sleep apnea w/no improvement w/CPAP. She feels that work has become overwhelming. I advised her to discuss disability options with her HR dept and then come in for OV with Dr Yetta Barre.

## 2011-06-05 ENCOUNTER — Other Ambulatory Visit: Payer: Self-pay | Admitting: Internal Medicine

## 2011-09-16 ENCOUNTER — Ambulatory Visit (INDEPENDENT_AMBULATORY_CARE_PROVIDER_SITE_OTHER)
Admission: RE | Admit: 2011-09-16 | Discharge: 2011-09-16 | Disposition: A | Discharge status: Home / Self Care | Payer: Managed Care, Other (non HMO) | Source: Ambulatory Visit | Attending: Internal Medicine | Admitting: Internal Medicine

## 2011-09-16 ENCOUNTER — Ambulatory Visit (INDEPENDENT_AMBULATORY_CARE_PROVIDER_SITE_OTHER): Payer: Managed Care, Other (non HMO) | Admitting: Internal Medicine

## 2011-09-16 ENCOUNTER — Encounter: Payer: Self-pay | Admitting: Internal Medicine

## 2011-09-16 DIAGNOSIS — M25519 Pain in unspecified shoulder: Secondary | ICD-10-CM

## 2011-09-16 DIAGNOSIS — J019 Acute sinusitis, unspecified: Secondary | ICD-10-CM

## 2011-09-16 DIAGNOSIS — M25511 Pain in right shoulder: Secondary | ICD-10-CM

## 2011-09-16 DIAGNOSIS — J309 Allergic rhinitis, unspecified: Secondary | ICD-10-CM

## 2011-09-16 MED ORDER — AZITHROMYCIN 500 MG PO TABS: 500.0000 mg | ORAL_TABLET | Freq: Every day | ORAL | Status: AC

## 2011-09-16 MED ORDER — METHYLPREDNISOLONE ACETATE 80 MG/ML IJ SUSP: 120.0000 mg | Freq: Once | INTRAMUSCULAR | Status: DC

## 2011-09-16 MED ORDER — AZELASTINE-FLUTICASONE 137-50 MCG/ACT NA SUSP: 1.0000 | Freq: Two times a day (BID) | NASAL | Status: DC

## 2011-09-16 MED ORDER — CELECOXIB 200 MG PO CAPS: 200.0000 mg | ORAL_CAPSULE | Freq: Every day | ORAL | Status: AC

## 2011-09-16 NOTE — Assessment & Plan Note (Signed)
Start zpak for the infection 

## 2011-09-16 NOTE — Progress Notes (Signed)
Subjective:    Patient ID: Ashley Cruz, female    DOB: 29-Jul-1945, 66 y.o.   MRN: [id number]  Sinusitis This is a new problem. The current episode started in the past 7 days. The problem has been gradually worsening since onset. There has been no fever. Her pain is at a severity of 0/10. She is experiencing no pain. Associated symptoms include chills, congestion, coughing, sinus pressure, sneezing and a sore throat. Pertinent negatives include no diaphoresis, ear pain, headaches, hoarse voice, neck pain, shortness of breath or swollen glands.  Shoulder Pain  The pain is present in the right shoulder. This is a recurrent problem. Episode onset: for 2-3 months. There has been a history of trauma. The problem occurs intermittently. The problem has been unchanged. The quality of the pain is described as aching. The pain is at a severity of 2/10. The pain is mild. Associated symptoms include a limited range of motion. Pertinent negatives include no fever, inability to bear weight, itching, joint locking, joint swelling, numbness, stiffness or tingling. The symptoms are aggravated by activity. She has tried nothing for the symptoms.      Review of Systems  Constitutional: Positive for chills. Negative for fever, diaphoresis, activity change, appetite change and fatigue.  HENT: Positive for congestion, sore throat, rhinorrhea, sneezing, postnasal drip and sinus pressure. Negative for hearing loss, ear pain, nosebleeds, hoarse voice, facial swelling, drooling, mouth sores, trouble swallowing, neck pain, neck stiffness, dental problem, voice change, tinnitus and ear discharge.   Eyes: Negative.   Respiratory: Positive for cough. Negative for apnea, choking, chest tightness, shortness of breath, wheezing and stridor.   Cardiovascular: Negative for chest pain, palpitations and leg swelling.  Gastrointestinal: Negative for nausea, vomiting, abdominal pain, diarrhea, constipation, blood in stool, abdominal  distention and anal bleeding.  Genitourinary: Negative.   Musculoskeletal: Negative for myalgias, back pain, joint swelling, arthralgias, gait problem and stiffness.  Skin: Negative for color change, itching, pallor, rash and wound.  Neurological: Negative for tingling, tremors, seizures, syncope, facial asymmetry, speech difficulty, weakness, light-headedness, numbness and headaches.  Hematological: Negative for adenopathy. Does not bruise/bleed easily.  Psychiatric/Behavioral: Negative.        Objective:   Physical Exam  Vitals reviewed. Constitutional: She is oriented to person, place, and time. She appears well-developed and well-nourished. No distress.  HENT:  Head: No trismus in the jaw.  Right Ear: Hearing, tympanic membrane, external ear and ear canal normal.  Left Ear: Hearing, tympanic membrane, external ear and ear canal normal.  Nose: Mucosal edema and rhinorrhea present. No nose lacerations, sinus tenderness, nasal deformity, septal deviation or nasal septal hematoma. No epistaxis.  No foreign bodies. Right sinus exhibits maxillary sinus tenderness. Right sinus exhibits no frontal sinus tenderness. Left sinus exhibits maxillary sinus tenderness. Left sinus exhibits no frontal sinus tenderness.  Mouth/Throat: Uvula is midline, oropharynx is clear and moist and mucous membranes are normal. Mucous membranes are not pale, not dry and not cyanotic. No uvula swelling. No oropharyngeal exudate, posterior oropharyngeal edema, posterior oropharyngeal erythema or tonsillar abscesses.  Eyes: Conjunctivae are normal. Right eye exhibits no discharge. Left eye exhibits no discharge. No scleral icterus.  Neck: Normal range of motion. Neck supple. No JVD present. No tracheal deviation present. No thyromegaly present.  Cardiovascular: Normal rate, regular rhythm, normal heart sounds and intact distal pulses.  Exam reveals no gallop and no friction rub.   No murmur heard. Pulmonary/Chest: Effort  normal and breath sounds normal. No stridor. No respiratory distress. She has  no wheezes. She has no rales. She exhibits no tenderness.  Abdominal: Soft. Bowel sounds are normal. She exhibits no distension and no mass. There is no tenderness. There is no rebound and no guarding.  Musculoskeletal: She exhibits no edema and no tenderness.       Right shoulder: She exhibits decreased range of motion. She exhibits no tenderness, no bony tenderness, no swelling, no effusion, no crepitus, no laceration, no pain, no spasm, normal pulse and normal strength.  Lymphadenopathy:    She has no cervical adenopathy.  Neurological: She is oriented to person, place, and time.  Skin: Skin is warm and dry. No rash noted. She is not diaphoretic. No erythema. No pallor.  Psychiatric: She has a normal mood and affect. Her behavior is normal. Judgment and thought content normal.          Assessment & Plan:

## 2011-09-16 NOTE — Assessment & Plan Note (Signed)
She was given an injection of depo-medrol IM to relieve her symptoms and she was given samples of dymista to use as well

## 2011-09-16 NOTE — Patient Instructions (Signed)
Sinusitis Sinuses are air pockets within the bones of your face. The growth of bacteria within a sinus leads to infection. The infection prevents the sinuses from draining. This infection is called sinusitis. SYMPTOMS  There will be different areas of pain depending on which sinuses have become infected.  The maxillary sinuses often produce pain beneath the eyes.   Frontal sinusitis may cause pain in the middle of the forehead and above the eyes.  Other problems (symptoms) include:  Toothaches.   Colored, pus-like (purulent) drainage from the nose.   Swelling, warmth, and tenderness over the sinus areas may be signs of infection.  TREATMENT  Sinusitis is most often determined by an exam.X-rays may be taken. If x-rays have been taken, make sure you obtain your results or find out how you are to obtain them. Your caregiver may give you medications (antibiotics). These are medications that will help kill the bacteria causing the infection. You may also be given a medication (decongestant) that helps to reduce sinus swelling.  HOME CARE INSTRUCTIONS   Only take over-the-counter or prescription medicines for pain, discomfort, or fever as directed by your caregiver.   Drink extra fluids. Fluids help thin the mucus so your sinuses can drain more easily.   Applying either moist heat or ice packs to the sinus areas may help relieve discomfort.   Use saline nasal sprays to help moisten your sinuses. The sprays can be found at your local drugstore.  SEEK IMMEDIATE MEDICAL CARE IF:  You have a fever.   You have increasing pain, severe headaches, or toothache.   You have nausea, vomiting, or drowsiness.   You develop unusual swelling around the face or trouble seeing.  MAKE SURE YOU:   Understand these instructions.   Will watch your condition.   Will get help right away if you are not doing well or get worse.  Document Released: 07/05/2005 Document Revised: 03/17/2011 Document Reviewed:  02/01/2007 [facility] Patient Information 2012 [place], [place].Rotator Cuff Injury The rotator cuff is the collective set of muscles and tendons that make up the stabilizing unit of your shoulder. This unit holds in the ball of the humerus (upper arm bone) in the socket of the scapula (shoulder blade). Injuries to this stabilizing unit most commonly come from sports or activities that cause the arm to be moved repeatedly over the head. Examples of this include throwing, weight lifting, swimming, racquet sports, or an injury such as falling on your arm. Chronic (longstanding) irritation of this unit can cause inflammation (soreness), bursitis, and eventual damage to the tendons to the point of rupture (tear). An acute (sudden) injury of the rotator cuff can result in a partial or complete tear. You may need surgery with complete tears. Small or partial rotator cuff tears may be treated conservatively with temporary immobilization, exercises and rest. Physical therapy may be needed. HOME CARE INSTRUCTIONS   Apply ice to the injury for 15 to 20 minutes 3 to 4 times per day for the first 2 days. Put the ice in a plastic bag and place a towel between the bag of ice and your skin.   If you have a shoulder immobilizer (sling and straps), do not remove it for as long as directed by your caregiver or until you see a caregiver for a follow-up examination. If you need to remove it, move your arm as little as possible.   You may want to sleep on several pillows or in a recliner at night to lessen swelling  and pain.   Only take over-the-counter or prescription medicines for pain, discomfort, or fever as directed by your caregiver.   Do simple hand squeezing exercises with a soft rubber ball to decrease hand swelling.  SEEK MEDICAL CARE IF:   Pain in your shoulder increases or new pain or numbness develops in your arm, hand, or fingers.   Your hand or fingers are colder than your other hand.  SEEK IMMEDIATE  MEDICAL CARE IF:   Your arm, hand, or fingers are numb or tingling.   Your arm, hand, or fingers are increasingly swollen and painful, or turn white or blue.  Document Released: 07/02/2000 Document Revised: 03/17/2011 Document Reviewed: 06/25/2008 [facility] Patient Information 2012 [place], [place].

## 2011-09-16 NOTE — Assessment & Plan Note (Signed)
I think she has a rotator cuff syndrome, I will check an xray to look for DJD, she will start taking celebrex for the pain

## 2011-09-20 ENCOUNTER — Telehealth: Payer: Self-pay

## 2011-09-20 MED ORDER — AMOXICILLIN-POT CLAVULANATE 875-125 MG PO TABS: 1.0000 | ORAL_TABLET | Freq: Two times a day (BID) | ORAL | Status: AC

## 2011-09-20 NOTE — Telephone Encounter (Signed)
Patient called [facility] stating that she was seen 09/16/11 and given z-pak for 3 days. She c/o continued bilateral  ear discomfort and cough with green mucous

## 2011-09-20 NOTE — Telephone Encounter (Signed)
Start augmentin 

## 2011-09-21 NOTE — Telephone Encounter (Signed)
Pt.notified

## 2011-10-02 ENCOUNTER — Other Ambulatory Visit: Payer: Self-pay | Admitting: Internal Medicine

## 2012-02-10 ENCOUNTER — Other Ambulatory Visit: Payer: Self-pay

## 2012-02-10 MED ORDER — SUMATRIPTAN SUCCINATE 50 MG PO TABS: ORAL_TABLET | ORAL | Status: DC

## 2012-02-13 ENCOUNTER — Other Ambulatory Visit: Payer: Self-pay | Admitting: Internal Medicine

## 2012-02-15 ENCOUNTER — Other Ambulatory Visit: Payer: Self-pay | Admitting: Internal Medicine

## 2012-04-19 ENCOUNTER — Telehealth: Payer: Self-pay | Admitting: Internal Medicine

## 2012-04-19 MED ORDER — CITALOPRAM HYDROBROMIDE 10 MG PO TABS: 10.0000 mg | ORAL_TABLET | Freq: Every day | ORAL | Status: DC

## 2012-04-19 NOTE — Telephone Encounter (Signed)
done

## 2012-04-19 NOTE — Telephone Encounter (Signed)
Caller: [facility]; Patient Name: Ashley Cruz, Ashley Cruz; PCP: Sanda Linger (Adults only); [facility] Phone Number: [phone]. Patient was prescribed Citalopram HBR 10mg  by mouth daily by her neurologist. She does not have any of this medication left at this time. The neurologist, Dr. Cherre Blanc, told her to have her PCP start prescribing this medication. Patient had left a voicemail on Friday 04/14/12 regarding this information, but has not heard back from anyone. Pharmacy: [facility] Phone: [phone]. PLEASE CALL THE PATIENT BACK AND LET HER KNOW IF THIS MEDICATION CAN BE PRESCRIBED OR IF SHE NEEDS TO HAVE AN OFFICE VISIT. Thanks.

## 2012-05-18 ENCOUNTER — Other Ambulatory Visit: Payer: Self-pay

## 2012-05-18 MED ORDER — CITALOPRAM HYDROBROMIDE 10 MG PO TABS: 10.0000 mg | ORAL_TABLET | Freq: Every day | ORAL | Status: DC

## 2013-05-28 ENCOUNTER — Telehealth: Payer: Self-pay | Admitting: *Deleted

## 2013-05-28 ENCOUNTER — Other Ambulatory Visit: Payer: Self-pay | Admitting: Internal Medicine

## 2013-05-28 NOTE — Telephone Encounter (Signed)
Pt called requesting Citalopram refill.  Please advise 

## 2013-05-28 NOTE — Telephone Encounter (Signed)
No, I have not seen her in 2 years

## 2013-05-29 ENCOUNTER — Telehealth: Payer: Self-pay | Admitting: *Deleted

## 2013-05-29 DIAGNOSIS — F329 Major depressive disorder, single episode, unspecified: Secondary | ICD-10-CM

## 2013-05-29 MED ORDER — CITALOPRAM HYDROBROMIDE 10 MG PO TABS: 10.0000 mg | ORAL_TABLET | Freq: Every day | ORAL | Status: DC

## 2013-05-29 NOTE — Telephone Encounter (Signed)
Pt has appointment scheduled Monday 11.17.14.  Requesting Citalopram refill til then.  Please advise

## 2013-05-29 NOTE — Telephone Encounter (Signed)
Spoke with pt advised of MDs message 

## 2013-06-04 ENCOUNTER — Encounter: Payer: Self-pay | Admitting: Internal Medicine

## 2013-06-04 ENCOUNTER — Ambulatory Visit (INDEPENDENT_AMBULATORY_CARE_PROVIDER_SITE_OTHER): Payer: Medicare Other | Admitting: Internal Medicine

## 2013-06-04 ENCOUNTER — Other Ambulatory Visit (INDEPENDENT_AMBULATORY_CARE_PROVIDER_SITE_OTHER): Payer: Medicare Other

## 2013-06-04 VITALS — BP 128/86 | HR 79 | Temp 97.6°F | Resp 16 | Ht 64.0 in | Wt 171.0 lb

## 2013-06-04 DIAGNOSIS — Z1231 Encounter for screening mammogram for malignant neoplasm of breast: Secondary | ICD-10-CM | POA: Diagnosis not present

## 2013-06-04 DIAGNOSIS — M899 Disorder of bone, unspecified: Secondary | ICD-10-CM

## 2013-06-04 DIAGNOSIS — Z23 Encounter for immunization: Secondary | ICD-10-CM | POA: Diagnosis not present

## 2013-06-04 DIAGNOSIS — F329 Major depressive disorder, single episode, unspecified: Secondary | ICD-10-CM

## 2013-06-04 DIAGNOSIS — Z0000 Encounter for general adult medical examination without abnormal findings: Secondary | ICD-10-CM

## 2013-06-04 DIAGNOSIS — E785 Hyperlipidemia, unspecified: Secondary | ICD-10-CM

## 2013-06-04 LAB — COMPREHENSIVE METABOLIC PANEL
ALT: 33 U/L (ref 0–35)
Albumin: 4.5 g/dL (ref 3.5–5.2)
Alkaline Phosphatase: 58 U/L (ref 39–117)
CO2: 26 mEq/L (ref 19–32)
Calcium: 9.5 mg/dL (ref 8.4–10.5)
Chloride: 102 mEq/L (ref 96–112)
GFR: 70.13 mL/min (ref >60.00)
Glucose, Bld: 84 mg/dL (ref 70–99)
Sodium: 138 mEq/L (ref 135–145)
Total Protein: 8.1 g/dL (ref 6.0–8.3)

## 2013-06-04 LAB — CBC WITH DIFFERENTIAL/PLATELET
Basophils Absolute: 0.1 10*3/uL (ref 0.0–0.1)
HCT: 43.3 % (ref 36.0–46.0)
Lymphocytes Relative: 40.4 % (ref 12.0–46.0)
Lymphs Abs: 2.7 10*3/uL (ref 0.7–4.0)
MCHC: 33.6 g/dL (ref 30.0–36.0)
Monocytes Relative: 9 % (ref 3.0–12.0)
Neutrophils Relative %: 45.1 % (ref 43.0–77.0)
Platelets: 291 10*3/uL (ref 150.0–400.0)
RDW: 13.9 % (ref 11.5–14.6)

## 2013-06-04 LAB — LIPID PANEL
Total CHOL/HDL Ratio: 5
VLDL: 30.4 mg/dL (ref 0.0–40.0)

## 2013-06-04 LAB — TSH: TSH: 3.03 u[IU]/mL (ref 0.35–5.50)

## 2013-06-04 NOTE — Patient Instructions (Signed)
Preventive Care for Adults, Female A healthy lifestyle and preventive care can promote health and wellness. Preventive health guidelines for women include the following key practices.  A routine yearly physical is a good way to check with your caregiver about your health and preventive screening. It is a chance to share any concerns and updates on your health, and to receive a thorough exam.  Visit your dentist for a routine exam and preventive care every 6 months. Brush your teeth twice a day and floss once a day. Good oral hygiene prevents tooth decay and gum disease.  The frequency of eye exams is based on your age, health, family medical history, use of contact lenses, and other factors. Follow your caregiver's recommendations for frequency of eye exams.  Eat a healthy diet. Foods like vegetables, fruits, whole grains, low-fat dairy products, and lean protein foods contain the nutrients you need without too many calories. Decrease your intake of foods high in solid fats, added sugars, and salt. Eat the right amount of calories for you.Get information about a proper diet from your caregiver, if necessary.  Regular physical exercise is one of the most important things you can do for your health. Most adults should get at least 150 minutes of moderate-intensity exercise (any activity that increases your heart rate and causes you to sweat) each week. In addition, most adults need muscle-strengthening exercises on 2 or more days a week.  Maintain a healthy weight. The body mass index (BMI) is a screening tool to identify possible weight problems. It provides an estimate of body fat based on height and weight. Your caregiver can help determine your BMI, and can help you achieve or maintain a healthy weight.For adults 20 years and older:  A BMI below 18.5 is considered underweight.  A BMI of 18.5 to 24.9 is normal.  A BMI of 25 to 29.9 is considered overweight.  A BMI of 30 and above is  considered obese.  Maintain normal blood lipids and cholesterol levels by exercising and minimizing your intake of saturated fat. Eat a balanced diet with plenty of fruit and vegetables. Blood tests for lipids and cholesterol should begin at age 20 and be repeated every 5 years. If your lipid or cholesterol levels are high, you are over 50, or you are at high risk for heart disease, you may need your cholesterol levels checked more frequently.Ongoing high lipid and cholesterol levels should be treated with medicines if diet and exercise are not effective.  If you smoke, find out from your caregiver how to quit. If you do not use tobacco, do not start.  Lung cancer screening is recommended for adults aged 55 80 years who are at high risk for developing lung cancer because of a history of smoking. Yearly low-dose computed tomography (CT) is recommended for people who have at least a 30-pack-year history of smoking and are a current smoker or have quit within the past 15 years. A pack year of smoking is smoking an average of 1 pack of cigarettes a day for 1 year (for example: 1 pack a day for 30 years or 2 packs a day for 15 years). Yearly screening should continue until the smoker has stopped smoking for at least 15 years. Yearly screening should also be stopped for people who develop a health problem that would prevent them from having lung cancer treatment.  If you are pregnant, do not drink alcohol. If you are breastfeeding, be very cautious about drinking alcohol. If you are   not pregnant and choose to drink alcohol, do not exceed 1 drink per day. One drink is considered to be 12 ounces (355 mL) of beer, 5 ounces (148 mL) of wine, or 1.5 ounces (44 mL) of liquor.  Avoid use of street drugs. Do not share needles with anyone. Ask for help if you need support or instructions about stopping the use of drugs.  High blood pressure causes heart disease and increases the risk of stroke. Your blood pressure  should be checked at least every 1 to 2 years. Ongoing high blood pressure should be treated with medicines if weight loss and exercise are not effective.  If you are 55 to 66 years old, ask your caregiver if you should take aspirin to prevent strokes.  Diabetes screening involves taking a blood sample to check your fasting blood sugar level. This should be done once every 3 years, after age 45, if you are within normal weight and without risk factors for diabetes. Testing should be considered at a younger age or be carried out more frequently if you are overweight and have at least 1 risk factor for diabetes.  Breast cancer screening is essential preventive care for women. You should practice "breast self-awareness." This means understanding the normal appearance and feel of your breasts and may include breast self-examination. Any changes detected, no matter how small, should be reported to a caregiver. Women in their 20s and 30s should have a clinical breast exam (CBE) by a caregiver as part of a regular health exam every 1 to 3 years. After age 40, women should have a CBE every year. Starting at age 40, women should consider having a mammography (breast X-ray test) every year. Women who have a family history of breast cancer should talk to their caregiver about genetic screening. Women at a high risk of breast cancer should talk to their caregivers about having magnetic resonance imaging (MRI) and a mammography every year.  Breast cancer gene (BRCA)-related cancer risk assessment is recommended for women who have family members with BRCA-related cancers. BRCA-related cancers include breast, ovarian, tubal, and peritoneal cancers. Having family members with these cancers may be associated with an increased risk for harmful changes (mutations) in the breast cancer genes BRCA1 and BRCA2. Results of the assessment will determine the need for genetic counseling and BRCA1 and BRCA2 testing.  The Pap test is  a screening test for cervical cancer. A Pap test can show cell changes on the cervix that might become cervical cancer if left untreated. A Pap test is a procedure in which cells are obtained and examined from the lower end of the uterus (cervix).  Women should have a Pap test starting at age 21.  Between ages 21 and 29, Pap tests should be repeated every 2 years.  Beginning at age 30, you should have a Pap test every 3 years as long as the past 3 Pap tests have been normal.  Some women have medical problems that increase the chance of getting cervical cancer. Talk to your caregiver about these problems. It is especially important to talk to your caregiver if a new problem develops soon after your last Pap test. In these cases, your caregiver may recommend more frequent screening and Pap tests.  The above recommendations are the same for women who have or have not gotten the vaccine for human papillomavirus (HPV).  If you had a hysterectomy for a problem that was not cancer or a condition that could lead to cancer, then   you no longer need Pap tests. Even if you no longer need a Pap test, a regular exam is a good idea to make sure no other problems are starting.  If you are between ages 65 and 70, and you have had normal Pap tests going back 10 years, you no longer need Pap tests. Even if you no longer need a Pap test, a regular exam is a good idea to make sure no other problems are starting.  If you have had past treatment for cervical cancer or a condition that could lead to cancer, you need Pap tests and screening for cancer for at least 20 years after your treatment.  If Pap tests have been discontinued, risk factors (such as a new sexual partner) need to be reassessed to determine if screening should be resumed.  The HPV test is an additional test that may be used for cervical cancer screening. The HPV test looks for the virus that can cause the cell changes on the cervix. The cells collected  during the Pap test can be tested for HPV. The HPV test could be used to screen women aged 30 years and older, and should be used in women of any age who have unclear Pap test results. After the age of 30, women should have HPV testing at the same frequency as a Pap test.  Colorectal cancer can be detected and often prevented. Most routine colorectal cancer screening begins at the age of 50 and continues through age 75. However, your caregiver may recommend screening at an earlier age if you have risk factors for colon cancer. On a yearly basis, your caregiver may provide home test kits to check for hidden blood in the stool. Use of a small camera at the end of a tube, to directly examine the colon (sigmoidoscopy or colonoscopy), can detect the earliest forms of colorectal cancer. Talk to your caregiver about this at age 50, when routine screening begins. Direct examination of the colon should be repeated every 5 to 10 years through age 75, unless early forms of pre-cancerous polyps or small growths are found.  Hepatitis C blood testing is recommended for all people born from 1945 through 1965 and any individual with known risks for hepatitis C.  Practice safe sex. Use condoms and avoid high-risk sexual practices to reduce the spread of sexually transmitted infections (STIs). STIs include gonorrhea, chlamydia, syphilis, trichomonas, herpes, HPV, and human immunodeficiency virus (HIV). Herpes, HIV, and HPV are viral illnesses that have no cure. They can result in disability, cancer, and death. Sexually active women aged 25 and younger should be checked for chlamydia. Older women with new or multiple partners should also be tested for chlamydia. Testing for other STIs is recommended if you are sexually active and at increased risk.  Osteoporosis is a disease in which the bones lose minerals and strength with aging. This can result in serious bone fractures. The risk of osteoporosis can be identified using a  bone density scan. Women ages 65 and over and women at risk for fractures or osteoporosis should discuss screening with their caregivers. Ask your caregiver whether you should take a calcium supplement or vitamin D to reduce the rate of osteoporosis.  Menopause can be associated with physical symptoms and risks. Hormone replacement therapy is available to decrease symptoms and risks. You should talk to your caregiver about whether hormone replacement therapy is right for you.  Use sunscreen. Apply sunscreen liberally and repeatedly throughout the day. You should seek shade   when your shadow is shorter than you. Protect yourself by wearing long sleeves, pants, a wide-brimmed hat, and sunglasses year round, whenever you are outdoors.  Once a month, do a whole body skin exam, using a mirror to look at the skin on your back. Notify your caregiver of new moles, moles that have irregular borders, moles that are larger than a pencil eraser, or moles that have changed in shape or color.  Stay current with required immunizations.  Influenza vaccine. All adults should be immunized every year.  Tetanus, diphtheria, and acellular pertussis (Td, Tdap) vaccine. Pregnant women should receive 1 dose of Tdap vaccine during each pregnancy. The dose should be obtained regardless of the length of time since the last dose. Immunization is preferred during the 27th to 36th week of gestation. An adult who has not previously received Tdap or who does not know her vaccine status should receive 1 dose of Tdap. This initial dose should be followed by tetanus and diphtheria toxoids (Td) booster doses every 10 years. Adults with an unknown or incomplete history of completing a 3-dose immunization series with Td-containing vaccines should begin or complete a primary immunization series including a Tdap dose. Adults should receive a Td booster every 10 years.  Varicella vaccine. An adult without evidence of immunity to varicella  should receive 2 doses or a second dose if she has previously received 1 dose. Pregnant females who do not have evidence of immunity should receive the first dose after pregnancy. This first dose should be obtained before leaving the health care facility. The second dose should be obtained 4 8 weeks after the first dose.  Human papillomavirus (HPV) vaccine. Females aged 13 26 years who have not received the vaccine previously should obtain the 3-dose series. The vaccine is not recommended for use in pregnant females. However, pregnancy testing is not needed before receiving a dose. If a female is found to be pregnant after receiving a dose, no treatment is needed. In that case, the remaining doses should be delayed until after the pregnancy. Immunization is recommended for any person with an immunocompromised condition through the age of 26 years if she did not get any or all doses earlier. During the 3-dose series, the second dose should be obtained 4 8 weeks after the first dose. The third dose should be obtained 24 weeks after the first dose and 16 weeks after the second dose.  Zoster vaccine. One dose is recommended for adults aged 60 years or older unless certain conditions are present.  Measles, mumps, and rubella (MMR) vaccine. Adults born before 1957 generally are considered immune to measles and mumps. Adults born in 1957 or later should have 1 or more doses of MMR vaccine unless there is a contraindication to the vaccine or there is laboratory evidence of immunity to each of the three diseases. A routine second dose of MMR vaccine should be obtained at least 28 days after the first dose for students attending postsecondary schools, health care workers, or international travelers. People who received inactivated measles vaccine or an unknown type of measles vaccine during 1963 1967 should receive 2 doses of MMR vaccine. People who received inactivated mumps vaccine or an unknown type of mumps vaccine  before 1979 and are at high risk for mumps infection should consider immunization with 2 doses of MMR vaccine. For females of childbearing age, rubella immunity should be determined. If there is no evidence of immunity, females who are not pregnant should be vaccinated. If there   is no evidence of immunity, females who are pregnant should delay immunization until after pregnancy. Unvaccinated health care workers born before 1957 who lack laboratory evidence of measles, mumps, or rubella immunity or laboratory confirmation of disease should consider measles and mumps immunization with 2 doses of MMR vaccine or rubella immunization with 1 dose of MMR vaccine.  Pneumococcal 13-valent conjugate (PCV13) vaccine. When indicated, a person who is uncertain of her immunization history and has no record of immunization should receive the PCV13 vaccine. An adult aged 19 years or older who has certain medical conditions and has not been previously immunized should receive 1 dose of PCV13 vaccine. This PCV13 should be followed with a dose of pneumococcal polysaccharide (PPSV23) vaccine. The PPSV23 vaccine dose should be obtained at least 8 weeks after the dose of PCV13 vaccine. An adult aged 19 years or older who has certain medical conditions and previously received 1 or more doses of PPSV23 vaccine should receive 1 dose of PCV13. The PCV13 vaccine dose should be obtained 1 or more years after the last PPSV23 vaccine dose.  Pneumococcal polysaccharide (PPSV23) vaccine. When PCV13 is also indicated, PCV13 should be obtained first. All adults aged 65 years and older should be immunized. An adult younger than age 65 years who has certain medical conditions should be immunized. Any person who resides in a nursing home or long-term care facility should be immunized. An adult smoker should be immunized. People with an immunocompromised condition and certain other conditions should receive both PCV13 and PPSV23 vaccines. People  with human immunodeficiency virus (HIV) infection should be immunized as soon as possible after diagnosis. Immunization during chemotherapy or radiation therapy should be avoided. Routine use of PPSV23 vaccine is not recommended for American Indians, [place] Natives, or people younger than 65 years unless there are medical conditions that require PPSV23 vaccine. When indicated, people who have unknown immunization and have no record of immunization should receive PPSV23 vaccine. One-time revaccination 5 years after the first dose of PPSV23 is recommended for people aged 19 64 years who have chronic kidney failure, nephrotic syndrome, asplenia, or immunocompromised conditions. People who received 1 2 doses of PPSV23 before age 65 years should receive another dose of PPSV23 vaccine at age 65 years or later if at least 5 years have passed since the previous dose. Doses of PPSV23 are not needed for people immunized with PPSV23 at or after age 65 years.  Meningococcal vaccine. Adults with asplenia or persistent complement component deficiencies should receive 2 doses of quadrivalent meningococcal conjugate (MenACWY-D) vaccine. The doses should be obtained at least 2 months apart. Microbiologists working with certain meningococcal bacteria, military recruits, people at risk during an outbreak, and people who travel to or live in countries with a high rate of meningitis should be immunized. A first-year college student up through age 21 years who is living in a residence hall should receive a dose if she did not receive a dose on or after her 16th birthday. Adults who have certain high-risk conditions should receive one or more doses of vaccine.  Hepatitis A vaccine. Adults who wish to be protected from this disease, have certain high-risk conditions, work with hepatitis A-infected animals, work in hepatitis A research labs, or travel to or work in countries with a high rate of hepatitis A should be immunized. Adults  who were previously unvaccinated and who anticipate close contact with an international adoptee during the first 60 days after arrival in the [place] from a country   with a high rate of hepatitis A should be immunized.  Hepatitis B vaccine. Adults who wish to be protected from this disease, have certain high-risk conditions, may be exposed to blood or other infectious body fluids, are household contacts or sex partners of hepatitis B positive people, are clients or workers in certain care facilities, or travel to or work in countries with a high rate of hepatitis B should be immunized.  Haemophilus influenzae type b (Hib) vaccine. A previously unvaccinated person with asplenia or sickle cell disease or having a scheduled splenectomy should receive 1 dose of Hib vaccine. Regardless of previous immunization, a recipient of a hematopoietic stem cell transplant should receive a 3-dose series 6 12 months after her successful transplant. Hib vaccine is not recommended for adults with HIV infection. Preventive Services / Frequency Ages 19 to 39  Blood pressure check.** / Every 1 to 2 years.  Lipid and cholesterol check.** / Every 5 years beginning at age 20.  Clinical breast exam.** / Every 3 years for women in their 20s and 30s.  BRCA-related cancer risk assessment.** / For women who have family members with a BRCA-related cancer (breast, ovarian, tubal, or peritoneal cancers).  Pap test.** / Every 2 years from ages 21 through 29. Every 3 years starting at age 30 through age 65 or 70 with a history of 3 consecutive normal Pap tests.  HPV screening.** / Every 3 years from ages 30 through ages 65 to 70 with a history of 3 consecutive normal Pap tests.  Hepatitis C blood test.** / For any individual with known risks for hepatitis C.  Skin self-exam. / Monthly.  Influenza vaccine. / Every year.  Tetanus, diphtheria, and acellular pertussis (Tdap, Td) vaccine.** / Consult your caregiver. Pregnant  women should receive 1 dose of Tdap vaccine during each pregnancy. 1 dose of Td every 10 years.  Varicella vaccine.** / Consult your caregiver. Pregnant females who do not have evidence of immunity should receive the first dose after pregnancy.  HPV vaccine. / 3 doses over 6 months, if 26 and younger. The vaccine is not recommended for use in pregnant females. However, pregnancy testing is not needed before receiving a dose.  Measles, mumps, rubella (MMR) vaccine.** / You need at least 1 dose of MMR if you were born in 1957 or later. You may also need a 2nd dose. For females of childbearing age, rubella immunity should be determined. If there is no evidence of immunity, females who are not pregnant should be vaccinated. If there is no evidence of immunity, females who are pregnant should delay immunization until after pregnancy.  Pneumococcal 13-valent conjugate (PCV13) vaccine.** / Consult your caregiver.  Pneumococcal polysaccharide (PPSV23) vaccine.** / 1 to 2 doses if you smoke cigarettes or if you have certain conditions.  Meningococcal vaccine.** / 1 dose if you are age 19 to 21 years and a first-year college student living in a residence hall, or have one of several medical conditions, you need to get vaccinated against meningococcal disease. You may also need additional booster doses.  Hepatitis A vaccine.** / Consult your caregiver.  Hepatitis B vaccine.** / Consult your caregiver.  Haemophilus influenzae type b (Hib) vaccine.** / Consult your caregiver. Ages 40 to 64  Blood pressure check.** / Every 1 to 2 years.  Lipid and cholesterol check.** / Every 5 years beginning at age 20.  Lung cancer screening. / Every year if you are aged 55 80 years and have a 30-pack-year history of smoking and   currently smoke or have quit within the past 15 years. Yearly screening is stopped once you have quit smoking for at least 15 years or develop a health problem that would prevent you from having  lung cancer treatment.  Clinical breast exam.** / Every year after age 40.  BRCA-related cancer risk assessment.** / For women who have family members with a BRCA-related cancer (breast, ovarian, tubal, or peritoneal cancers).  Mammogram.** / Every year beginning at age 40 and continuing for as long as you are in good health. Consult with your caregiver.  Pap test.** / Every 3 years starting at age 30 through age 65 or 70 with a history of 3 consecutive normal Pap tests.  HPV screening.** / Every 3 years from ages 30 through ages 65 to 70 with a history of 3 consecutive normal Pap tests.  Fecal occult blood test (FOBT) of stool. / Every year beginning at age 50 and continuing until age 75. You may not need to do this test if you get a colonoscopy every 10 years.  Flexible sigmoidoscopy or colonoscopy.** / Every 5 years for a flexible sigmoidoscopy or every 10 years for a colonoscopy beginning at age 50 and continuing until age 75.  Hepatitis C blood test.** / For all people born from 1945 through 1965 and any individual with known risks for hepatitis C.  Skin self-exam. / Monthly.  Influenza vaccine. / Every year.  Tetanus, diphtheria, and acellular pertussis (Tdap/Td) vaccine.** / Consult your caregiver. Pregnant women should receive 1 dose of Tdap vaccine during each pregnancy. 1 dose of Td every 10 years.  Varicella vaccine.** / Consult your caregiver. Pregnant females who do not have evidence of immunity should receive the first dose after pregnancy.  Zoster vaccine.** / 1 dose for adults aged 60 years or older.  Measles, mumps, rubella (MMR) vaccine.** / You need at least 1 dose of MMR if you were born in 1957 or later. You may also need a 2nd dose. For females of childbearing age, rubella immunity should be determined. If there is no evidence of immunity, females who are not pregnant should be vaccinated. If there is no evidence of immunity, females who are pregnant should delay  immunization until after pregnancy.  Pneumococcal 13-valent conjugate (PCV13) vaccine.** / Consult your caregiver.  Pneumococcal polysaccharide (PPSV23) vaccine.** / 1 to 2 doses if you smoke cigarettes or if you have certain conditions.  Meningococcal vaccine.** / Consult your caregiver.  Hepatitis A vaccine.** / Consult your caregiver.  Hepatitis B vaccine.** / Consult your caregiver.  Haemophilus influenzae type b (Hib) vaccine.** / Consult your caregiver. Ages 65 and over  Blood pressure check.** / Every 1 to 2 years.  Lipid and cholesterol check.** / Every 5 years beginning at age 20.  Lung cancer screening. / Every year if you are aged 55 80 years and have a 30-pack-year history of smoking and currently smoke or have quit within the past 15 years. Yearly screening is stopped once you have quit smoking for at least 15 years or develop a health problem that would prevent you from having lung cancer treatment.  Clinical breast exam.** / Every year after age 40.  BRCA-related cancer risk assessment.** / For women who have family members with a BRCA-related cancer (breast, ovarian, tubal, or peritoneal cancers).  Mammogram.** / Every year beginning at age 40 and continuing for as long as you are in good health. Consult with your caregiver.  Pap test.** / Every 3 years starting at age   30 through age 65 or 70 with a 3 consecutive normal Pap tests. Testing can be stopped between 65 and 70 with 3 consecutive normal Pap tests and no abnormal Pap or HPV tests in the past 10 years.  HPV screening.** / Every 3 years from ages 30 through ages 65 or 70 with a history of 3 consecutive normal Pap tests. Testing can be stopped between 65 and 70 with 3 consecutive normal Pap tests and no abnormal Pap or HPV tests in the past 10 years.  Fecal occult blood test (FOBT) of stool. / Every year beginning at age 50 and continuing until age 75. You may not need to do this test if you get a colonoscopy  every 10 years.  Flexible sigmoidoscopy or colonoscopy.** / Every 5 years for a flexible sigmoidoscopy or every 10 years for a colonoscopy beginning at age 50 and continuing until age 75.  Hepatitis C blood test.** / For all people born from 1945 through 1965 and any individual with known risks for hepatitis C.  Osteoporosis screening.** / A one-time screening for women ages 65 and over and women at risk for fractures or osteoporosis.  Skin self-exam. / Monthly.  Influenza vaccine. / Every year.  Tetanus, diphtheria, and acellular pertussis (Tdap/Td) vaccine.** / 1 dose of Td every 10 years.  Varicella vaccine.** / Consult your caregiver.  Zoster vaccine.** / 1 dose for adults aged 60 years or older.  Pneumococcal 13-valent conjugate (PCV13) vaccine.** / Consult your caregiver.  Pneumococcal polysaccharide (PPSV23) vaccine.** / 1 dose for all adults aged 65 years and older.  Meningococcal vaccine.** / Consult your caregiver.  Hepatitis A vaccine.** / Consult your caregiver.  Hepatitis B vaccine.** / Consult your caregiver.  Haemophilus influenzae type b (Hib) vaccine.** / Consult your caregiver. ** Family history and personal history of risk and conditions may change your caregiver's recommendations. Document Released: 08/31/2001 Document Revised: 10/30/2012 Document Reviewed: 11/30/2010 [employer] Patient Information 2014 [employer].  

## 2013-06-04 NOTE — Progress Notes (Signed)
Subjective:    Patient ID: Ashley Cruz, female    DOB: 11/01/1945, 67 y.o.   MRN: [id number]  Hyperlipidemia This is a chronic problem. The current episode started more than 1 year ago. The problem is resistant. Recent lipid tests were reviewed and are high. Exacerbating diseases include obesity. She has no history of chronic renal disease, diabetes, hypothyroidism, liver disease or nephrotic syndrome. Factors aggravating her hyperlipidemia include fatty foods. Pertinent negatives include no chest pain, focal sensory loss, focal weakness, leg pain, myalgias or shortness of breath. She is currently on no antihyperlipidemic treatment. The current treatment provides no improvement of lipids. Compliance problems include adherence to diet and adherence to exercise.       Review of Systems  Constitutional: Negative.  Negative for fever, chills, diaphoresis, appetite change, fatigue and unexpected weight change.  HENT: Negative.   Eyes: Negative.   Respiratory: Negative.  Negative for cough, choking, chest tightness, shortness of breath and stridor.   Cardiovascular: Negative.  Negative for chest pain, palpitations and leg swelling.  Gastrointestinal: Negative.  Negative for nausea, vomiting, abdominal pain, diarrhea, constipation and blood in stool.  Endocrine: Negative.   Genitourinary: Negative.   Musculoskeletal: Negative.  Negative for arthralgias, back pain, gait problem, joint swelling, myalgias, neck pain and neck stiffness.  Skin: Negative.  Negative for color change, pallor, rash and wound.  Allergic/Immunologic: Negative.   Neurological: Negative.  Negative for focal weakness.  Hematological: Negative.  Negative for adenopathy. Does not bruise/bleed easily.  Psychiatric/Behavioral: Negative.        Objective:   Physical Exam  Vitals reviewed. Constitutional: She is oriented to person, place, and time. She appears well-developed and well-nourished. No distress.  HENT:  Head:  Normocephalic and atraumatic.  Mouth/Throat: Oropharynx is clear and moist. No oropharyngeal exudate.  Eyes: Conjunctivae are normal. Right eye exhibits no discharge. Left eye exhibits no discharge. No scleral icterus.  Neck: Normal range of motion. Neck supple. No JVD present. No tracheal deviation present. No thyromegaly present.  Cardiovascular: Normal rate, regular rhythm, normal heart sounds and intact distal pulses.  Exam reveals no gallop and no friction rub.   No murmur heard. Pulmonary/Chest: Effort normal and breath sounds normal. No stridor. No respiratory distress. She has no wheezes. She has no rales. She exhibits no tenderness.  Abdominal: Soft. Bowel sounds are normal. She exhibits no distension and no mass. There is no tenderness. There is no rebound and no guarding.  Musculoskeletal: Normal range of motion. She exhibits no edema and no tenderness.  Lymphadenopathy:    She has no cervical adenopathy.  Neurological: She is oriented to person, place, and time.  Skin: Skin is warm and dry. No rash noted. She is not diaphoretic. No erythema. No pallor.  Psychiatric: She has a normal mood and affect. Her behavior is normal. Judgment and thought content normal.     Lab Results  Component Value Date   WBC 7.5 03/01/2011   HGB 14.0 03/01/2011   HCT 42.7 03/01/2011   PLT 305 03/01/2011   GLUCOSE 91 03/01/2011   CHOL 175 02/13/2009   TRIG 76.0 02/13/2009   HDL 64.30 02/13/2009   LDLDIRECT 152.0 06/07/2008   LDLCALC 96 02/13/2009   ALT 22 03/01/2011   AST 22 03/01/2011   NA 140 03/01/2011   K 4.1 03/01/2011   CL 103 03/01/2011   CREATININE 0.92 03/01/2011   BUN 24* 03/01/2011   CO2 27 03/01/2011   TSH 3.32 02/13/2009   INR 1.0 RATIO 04/04/2008  HGBA1C  Value: 5.7 (NOTE) The ADA recommends the following therapeutic goal for glycemic control related to Hgb A1c measurement: Goal of therapy: <6.5 Hgb A1c  Reference: American Diabetes Association: Clinical Practice Recommendations 2010, Diabetes  Care, 2010, 33: (Suppl  1). 11/26/2008       Assessment & Plan:

## 2013-06-05 ENCOUNTER — Encounter: Payer: Self-pay | Admitting: Internal Medicine

## 2013-06-06 ENCOUNTER — Other Ambulatory Visit: Payer: Self-pay | Admitting: Internal Medicine

## 2013-06-06 DIAGNOSIS — Z0000 Encounter for general adult medical examination without abnormal findings: Secondary | ICD-10-CM | POA: Insufficient documentation

## 2013-06-06 MED ORDER — ATORVASTATIN CALCIUM 40 MG PO TABS: 40.0000 mg | ORAL_TABLET | Freq: Every day | ORAL | Status: DC

## 2013-06-06 NOTE — Assessment & Plan Note (Signed)
She is doing well on celexa 

## 2013-06-06 NOTE — Assessment & Plan Note (Signed)
She is due for a DEXA scan I tried to order a Vit D level but it was not approved by the insurer per our software

## 2013-06-06 NOTE — Assessment & Plan Note (Signed)

## 2013-06-06 NOTE — Assessment & Plan Note (Signed)
She has been non-compliant with the statin therapy Her LDL is very high I have asked her to restart the statin

## 2013-06-25 ENCOUNTER — Other Ambulatory Visit: Payer: Self-pay | Admitting: Internal Medicine

## 2013-07-02 DIAGNOSIS — Z1211 Encounter for screening for malignant neoplasm of colon: Secondary | ICD-10-CM | POA: Diagnosis not present

## 2013-07-13 LAB — HM COLONOSCOPY: HM Colonoscopy: NEGATIVE

## 2013-07-24 ENCOUNTER — Encounter: Payer: Self-pay | Admitting: Internal Medicine

## 2013-07-25 ENCOUNTER — Ambulatory Visit: Payer: Medicare Other

## 2013-07-25 ENCOUNTER — Other Ambulatory Visit: Payer: Medicare Other

## 2013-08-13 ENCOUNTER — Ambulatory Visit (INDEPENDENT_AMBULATORY_CARE_PROVIDER_SITE_OTHER)
Admission: RE | Admit: 2013-08-13 | Discharge: 2013-08-13 | Disposition: A | Discharge status: Home / Self Care | Payer: Medicare Other | Source: Ambulatory Visit | Attending: Internal Medicine | Admitting: Internal Medicine

## 2013-08-13 ENCOUNTER — Ambulatory Visit (INDEPENDENT_AMBULATORY_CARE_PROVIDER_SITE_OTHER): Payer: Medicare Other | Admitting: Internal Medicine

## 2013-08-13 ENCOUNTER — Encounter: Payer: Self-pay | Admitting: Internal Medicine

## 2013-08-13 ENCOUNTER — Other Ambulatory Visit (INDEPENDENT_AMBULATORY_CARE_PROVIDER_SITE_OTHER): Payer: Medicare Other

## 2013-08-13 VITALS — BP 130/80 | HR 60 | Temp 97.7°F | Resp 16 | Ht 64.0 in | Wt 165.2 lb

## 2013-08-13 DIAGNOSIS — H6980 Other specified disorders of Eustachian tube, unspecified ear: Secondary | ICD-10-CM

## 2013-08-13 DIAGNOSIS — R0781 Pleurodynia: Secondary | ICD-10-CM

## 2013-08-13 DIAGNOSIS — R05 Cough: Secondary | ICD-10-CM

## 2013-08-13 DIAGNOSIS — J209 Acute bronchitis, unspecified: Secondary | ICD-10-CM | POA: Diagnosis not present

## 2013-08-13 DIAGNOSIS — R059 Cough, unspecified: Secondary | ICD-10-CM

## 2013-08-13 DIAGNOSIS — R071 Chest pain on breathing: Secondary | ICD-10-CM

## 2013-08-13 DIAGNOSIS — J8410 Pulmonary fibrosis, unspecified: Secondary | ICD-10-CM | POA: Diagnosis not present

## 2013-08-13 LAB — CBC WITH DIFFERENTIAL/PLATELET
Basophils Absolute: 0 10*3/uL (ref 0.0–0.1)
Basophils Relative: 0.4 % (ref 0.0–3.0)
Eosinophils Absolute: 0.2 10*3/uL (ref 0.0–0.7)
Eosinophils Relative: 3.3 % (ref 0.0–5.0)
HEMATOCRIT: 42.7 % (ref 36.0–46.0)
Hemoglobin: 14.1 g/dL (ref 12.0–15.0)
LYMPHS ABS: 2.4 10*3/uL (ref 0.7–4.0)
Lymphocytes Relative: 38.7 % (ref 12.0–46.0)
MCHC: 33 g/dL (ref 30.0–36.0)
MCV: 90.9 fl (ref 78.0–100.0)
MONO ABS: 0.6 10*3/uL (ref 0.1–1.0)
Monocytes Relative: 10.3 % (ref 3.0–12.0)
NEUTROS ABS: 2.9 10*3/uL (ref 1.4–7.7)
Neutrophils Relative %: 47.3 % (ref 43.0–77.0)
PLATELETS: 285 10*3/uL (ref 150.0–400.0)
RBC: 4.69 Mil/uL (ref 3.87–5.11)
RDW: 14 % (ref 11.5–14.6)
WBC: 6.2 10*3/uL (ref 4.5–10.5)

## 2013-08-13 LAB — TROPONIN I: Troponin I: 0.02 ng/mL (ref <0.06)

## 2013-08-13 LAB — D-DIMER, QUANTITATIVE (NOT AT ARMC): D-Dimer, Quant: 0.54 ug/mL-FEU — ABNORMAL HIGH (ref 0.00–0.48)

## 2013-08-13 MED ORDER — AZITHROMYCIN 500 MG PO TABS: 500.0000 mg | ORAL_TABLET | Freq: Every day | ORAL | Status: DC

## 2013-08-13 MED ORDER — HYDROCODONE-HOMATROPINE 5-1.5 MG/5ML PO SYRP: 5.0000 mL | ORAL_SOLUTION | Freq: Three times a day (TID) | ORAL | Status: DC | PRN

## 2013-08-13 MED ORDER — METHYLPREDNISOLONE ACETATE 80 MG/ML IJ SUSP
120.0000 mg | Freq: Once | INTRAMUSCULAR | Status: AC
  Administered 2013-08-13: 120 mg via INTRAMUSCULAR

## 2013-08-13 NOTE — Patient Instructions (Signed)

## 2013-08-13 NOTE — Progress Notes (Signed)
Subjective:    Patient ID: Ashley Cruz, female    DOB: 31-Dec-1945, 68 y.o.   MRN: [id number]  Cough This is a new problem. The current episode started 1 to 4 weeks ago. The problem has been gradually worsening. The problem occurs every few hours. The cough is productive of purulent sputum. Associated symptoms include chest pain (sharp pain, left lower ribs with cough), chills, ear pain (popping and pressure in both ears), a fever, nasal congestion, rhinorrhea and a sore throat. Pertinent negatives include no ear congestion, headaches, heartburn, hemoptysis, myalgias, postnasal drip, rash, shortness of breath, sweats, weight loss or wheezing. Nothing aggravates the symptoms. Treatments tried: sudafed. The treatment provided no relief.      Review of Systems  Constitutional: Positive for fever, chills and fatigue. Negative for weight loss, diaphoresis, activity change, appetite change and unexpected weight change.  HENT: Positive for ear pain (popping and pressure in both ears), rhinorrhea, sneezing and sore throat. Negative for congestion, facial swelling, nosebleeds, postnasal drip, sinus pressure, tinnitus, trouble swallowing and voice change.   Eyes: Negative.   Respiratory: Positive for cough. Negative for apnea, hemoptysis, choking, chest tightness, shortness of breath, wheezing and stridor.   Cardiovascular: Positive for chest pain (sharp pain, left lower ribs with cough). Negative for palpitations and leg swelling.  Gastrointestinal: Negative.  Negative for heartburn.  Endocrine: Negative.   Genitourinary: Negative.   Musculoskeletal: Negative.  Negative for myalgias.  Skin: Negative.  Negative for rash.  Allergic/Immunologic: Negative.   Neurological: Negative.  Negative for dizziness and headaches.  Hematological: Negative.  Negative for adenopathy. Does not bruise/bleed easily.  Psychiatric/Behavioral: Negative.        Objective:   Physical Exam  Vitals  reviewed. Constitutional: She is oriented to person, place, and time. She appears well-developed and well-nourished.  Non-toxic appearance. She does not have a sickly appearance. She does not appear ill. No distress.  HENT:  Head: Normocephalic and atraumatic.  Mouth/Throat: Oropharynx is clear and moist. No oropharyngeal exudate.  Eyes: Conjunctivae are normal. Right eye exhibits no discharge. Left eye exhibits no discharge. No scleral icterus.  Neck: Normal range of motion. Neck supple. No JVD present. No tracheal deviation present. No thyromegaly present.  Cardiovascular: Normal rate, regular rhythm, normal heart sounds and intact distal pulses.  Exam reveals no gallop and no friction rub.   No murmur heard. Pulmonary/Chest: Effort normal and breath sounds normal. No accessory muscle usage or stridor. Not tachypneic. No respiratory distress. She has no decreased breath sounds. She has no wheezes. She has no rhonchi. She has no rales. She exhibits no tenderness.  Abdominal: Soft. Bowel sounds are normal. She exhibits no distension and no mass. There is no tenderness. There is no rebound and no guarding.  Musculoskeletal: Normal range of motion. She exhibits no edema and no tenderness.  Lymphadenopathy:    She has no cervical adenopathy.  Neurological: She is oriented to person, place, and time.  Skin: Skin is warm and dry. No rash noted. She is not diaphoretic. No erythema. No pallor.  Psychiatric: She has a normal mood and affect. Her behavior is normal. Judgment and thought content normal.     Lab Results  Component Value Date   WBC 6.8 06/04/2013   HGB 14.6 06/04/2013   HCT 43.3 06/04/2013   PLT 291.0 06/04/2013   GLUCOSE 84 06/04/2013   CHOL 313* 06/04/2013   TRIG 152.0* 06/04/2013   HDL 69.00 06/04/2013   LDLDIRECT 234.6 06/04/2013   LDLCALC 96  02/13/2009   ALT 33 06/04/2013   AST 26 06/04/2013   NA 138 06/04/2013   K 3.9 06/04/2013   CL 102 06/04/2013   CREATININE 0.9  06/04/2013   BUN 18 06/04/2013   CO2 26 06/04/2013   TSH 3.03 06/04/2013   INR 1.0 RATIO 04/04/2008   HGBA1C  Value: 5.7 (NOTE) The ADA recommends the following therapeutic goal for glycemic control related to Hgb A1c measurement: Goal of therapy: <6.5 Hgb A1c  Reference: American Diabetes Association: Clinical Practice Recommendations 2010, Diabetes Care, 2010, 33: (Suppl  1). 11/26/2008       Assessment & Plan:

## 2013-08-14 ENCOUNTER — Telehealth: Payer: Self-pay | Admitting: *Deleted

## 2013-08-14 ENCOUNTER — Telehealth: Payer: Self-pay | Admitting: Internal Medicine

## 2013-08-14 ENCOUNTER — Other Ambulatory Visit: Payer: Self-pay | Admitting: Internal Medicine

## 2013-08-14 ENCOUNTER — Encounter: Payer: Self-pay | Admitting: Internal Medicine

## 2013-08-14 LAB — CARDIAC PANEL
CK-MB: 1.9 ng/mL (ref 0.3–4.0)
Relative Index: 2.5 calc (ref 0.0–2.5)
Total CK: 76 U/L (ref 7–177)

## 2013-08-14 NOTE — Assessment & Plan Note (Signed)
Will treat the infection with zithromax and will control the cough with hycodan 

## 2013-08-14 NOTE — Telephone Encounter (Signed)
Patient called and left a VM on the triage line requesting x-ray and CBC results. She also wants to know the next step in her plan of care. Please advise.

## 2013-08-14 NOTE — Assessment & Plan Note (Signed)
Her CXR is normal.

## 2013-08-14 NOTE — Telephone Encounter (Signed)
Xray and labs were okay She will need to take the meds given yesterday and give it some time

## 2013-08-14 NOTE — Assessment & Plan Note (Signed)
Her CXR is normal, d-dimer is negative, cardiac enzymes are negative This appears to be musculoskeletal

## 2013-08-14 NOTE — Assessment & Plan Note (Signed)
Cont sudafed Will try an injection with depo-medrol to help with this

## 2013-08-14 NOTE — Telephone Encounter (Signed)
[facility] Triage Call Report Triage Record Num: [id number] Operator: Truddie Crumble Patient Name: Ashley Cruz Call Date & Time: 08/13/2013 7:29:14PM Patient Phone: [phone] PCP: Scarlette Calico Patient Gender: Female PCP Fax : Patient DOB: [birth year] Practice Name: [facility] Reason for Call: Caller: Shirlean Mylar; PCP: Scarlette Calico (Adults only); CB#: [phone]; Shirlean Mylar is calling from [facility] regarding a D-dimer ordered on by Scarlette Calico (Adults only).; States no critical results. Calling due to ordered stat. Abnormal D-dimer 0.54 ( 0-0.48) Triponin I is w/i normal limits at 0.02. Pt seen today for cough. Given Azithromycin and medrol dose pak. Protocol(s) Used: PCP Calls, No Triage (Adult) Recommended Outcome per Protocol: Call Provider within 24 Hours Reason for Outcome: Lab calling with test results Care Advice: ~ 01/

## 2013-08-15 ENCOUNTER — Telehealth: Payer: Self-pay | Admitting: *Deleted

## 2013-08-15 NOTE — Telephone Encounter (Signed)
Pt notified, lmovm

## 2013-08-15 NOTE — Telephone Encounter (Signed)
Patient called requesting results. Called pt back to let her know x-ray and labs have been resulted yet. Informed pt that she will receive call when they have been resulted.

## 2013-08-17 ENCOUNTER — Telehealth: Payer: Self-pay

## 2013-08-17 NOTE — Telephone Encounter (Signed)
What results? I sent her a letter after her last visit.  Ashley Cruz

## 2013-08-17 NOTE — Telephone Encounter (Signed)
Patient is returning missed call. Left a message on the triage line wanting to know what she can do to alleviate the pain she is having since her imaging results were negative. Please advise.

## 2013-08-17 NOTE — Telephone Encounter (Signed)
Patient called requesting test results. Please advise

## 2013-08-17 NOTE — Telephone Encounter (Signed)
Left msg to call back.

## 2013-08-20 NOTE — Telephone Encounter (Signed)
Left message to return call 

## 2013-08-20 NOTE — Telephone Encounter (Signed)
Try some motrin

## 2013-08-27 ENCOUNTER — Telehealth: Payer: Self-pay | Admitting: *Deleted

## 2013-08-27 DIAGNOSIS — R0781 Pleurodynia: Secondary | ICD-10-CM

## 2013-08-27 NOTE — Telephone Encounter (Signed)
Notified patient of MD response.  She states she is still having left sided "rib" pain.  Please advise.

## 2013-08-27 NOTE — Telephone Encounter (Signed)
Patient phoned requesting results from stool sample she submitted 07/02/13.  Please advise.   CB# [phone]

## 2013-08-27 NOTE — Telephone Encounter (Signed)
It was negative.

## 2013-08-28 MED ORDER — IBUPROFEN 600 MG PO TABS: 600.0000 mg | ORAL_TABLET | Freq: Three times a day (TID) | ORAL | Status: DC | PRN

## 2013-08-28 NOTE — Telephone Encounter (Signed)
What is she taking for pain? 

## 2013-08-28 NOTE — Telephone Encounter (Signed)
Rx for motrin was sent to her pharmacy

## 2013-08-28 NOTE — Telephone Encounter (Signed)
Phoned patient to obtain answer for MD's question regarding rib pain and stated she was NOT taking anything for her rib pain--simply living with it and tolerating it. Hurts when she coughs or sneezes and when she raises up and sits down.  Advise if further orders needed.

## 2013-08-28 NOTE — Telephone Encounter (Signed)
Notified patient of MD response and script ordered.  Understanding and appreciation verbalized.

## 2014-03-16 ENCOUNTER — Emergency Department (HOSPITAL_COMMUNITY)
Admission: EM | Admit: 2014-03-16 | Discharge: 2014-03-17 | Disposition: A | Discharge status: Home / Self Care | Payer: Medicare Other | Attending: Emergency Medicine | Admitting: Emergency Medicine

## 2014-03-16 ENCOUNTER — Encounter (HOSPITAL_COMMUNITY): Payer: Self-pay | Admitting: Emergency Medicine

## 2014-03-16 DIAGNOSIS — R5381 Other malaise: Secondary | ICD-10-CM | POA: Diagnosis not present

## 2014-03-16 DIAGNOSIS — Z87718 Personal history of other specified (corrected) congenital malformations of genitourinary system: Secondary | ICD-10-CM | POA: Insufficient documentation

## 2014-03-16 DIAGNOSIS — R42 Dizziness and giddiness: Secondary | ICD-10-CM | POA: Insufficient documentation

## 2014-03-16 DIAGNOSIS — F3289 Other specified depressive episodes: Secondary | ICD-10-CM | POA: Insufficient documentation

## 2014-03-16 DIAGNOSIS — R209 Unspecified disturbances of skin sensation: Secondary | ICD-10-CM | POA: Insufficient documentation

## 2014-03-16 DIAGNOSIS — F411 Generalized anxiety disorder: Secondary | ICD-10-CM | POA: Diagnosis not present

## 2014-03-16 DIAGNOSIS — Z79899 Other long term (current) drug therapy: Secondary | ICD-10-CM | POA: Diagnosis not present

## 2014-03-16 DIAGNOSIS — R55 Syncope and collapse: Secondary | ICD-10-CM | POA: Diagnosis present

## 2014-03-16 DIAGNOSIS — Z8719 Personal history of other diseases of the digestive system: Secondary | ICD-10-CM | POA: Diagnosis not present

## 2014-03-16 DIAGNOSIS — F329 Major depressive disorder, single episode, unspecified: Secondary | ICD-10-CM | POA: Diagnosis not present

## 2014-03-16 DIAGNOSIS — Z8673 Personal history of transient ischemic attack (TIA), and cerebral infarction without residual deficits: Secondary | ICD-10-CM | POA: Diagnosis not present

## 2014-03-16 DIAGNOSIS — R5383 Other fatigue: Secondary | ICD-10-CM

## 2014-03-16 DIAGNOSIS — E785 Hyperlipidemia, unspecified: Secondary | ICD-10-CM | POA: Diagnosis not present

## 2014-03-16 DIAGNOSIS — Z8742 Personal history of other diseases of the female genital tract: Secondary | ICD-10-CM | POA: Insufficient documentation

## 2014-03-16 DIAGNOSIS — Z8739 Personal history of other diseases of the musculoskeletal system and connective tissue: Secondary | ICD-10-CM | POA: Insufficient documentation

## 2014-03-16 DIAGNOSIS — R404 Transient alteration of awareness: Secondary | ICD-10-CM | POA: Diagnosis not present

## 2014-03-16 DIAGNOSIS — G43909 Migraine, unspecified, not intractable, without status migrainosus: Secondary | ICD-10-CM | POA: Diagnosis not present

## 2014-03-16 DIAGNOSIS — R110 Nausea: Secondary | ICD-10-CM | POA: Diagnosis not present

## 2014-03-16 LAB — CBC WITH DIFFERENTIAL/PLATELET
BASOS PCT: 1 % (ref 0–1)
Basophils Absolute: 0 10*3/uL (ref 0.0–0.1)
Eosinophils Absolute: 0.2 10*3/uL (ref 0.0–0.7)
Eosinophils Relative: 2 % (ref 0–5)
HEMATOCRIT: 40.8 % (ref 36.0–46.0)
HEMOGLOBIN: 13.5 g/dL (ref 12.0–15.0)
LYMPHS PCT: 31 % (ref 12–46)
Lymphs Abs: 2.4 10*3/uL (ref 0.7–4.0)
MCH: 30.5 pg (ref 26.0–34.0)
MCHC: 33.1 g/dL (ref 30.0–36.0)
MCV: 92.3 fL (ref 78.0–100.0)
MONO ABS: 0.7 10*3/uL (ref 0.1–1.0)
MONOS PCT: 9 % (ref 3–12)
NEUTROS ABS: 4.6 10*3/uL (ref 1.7–7.7)
Neutrophils Relative %: 57 % (ref 43–77)
Platelets: 275 10*3/uL (ref 150–400)
RBC: 4.42 MIL/uL (ref 3.87–5.11)
RDW: 13.6 % (ref 11.5–15.5)
WBC: 7.9 10*3/uL (ref 4.0–10.5)

## 2014-03-16 LAB — COMPREHENSIVE METABOLIC PANEL
ALK PHOS: 75 U/L (ref 39–117)
ALT: 23 U/L (ref 0–35)
AST: 18 U/L (ref 0–37)
Albumin: 3.9 g/dL (ref 3.5–5.2)
Anion gap: 14 (ref 5–15)
BILIRUBIN TOTAL: 0.3 mg/dL (ref 0.3–1.2)
BUN: 15 mg/dL (ref 6–23)
CHLORIDE: 102 meq/L (ref 96–112)
CO2: 22 mEq/L (ref 19–32)
Calcium: 9.1 mg/dL (ref 8.4–10.5)
Creatinine, Ser: 0.73 mg/dL (ref 0.50–1.10)
GFR calc Af Amer: >90 mL/min (ref >90)
GFR, EST NON AFRICAN AMERICAN: 87 mL/min — AB (ref >90)
Glucose, Bld: 92 mg/dL (ref 70–99)
Potassium: 4.2 mEq/L (ref 3.7–5.3)
Sodium: 138 mEq/L (ref 137–147)
Total Protein: 7.1 g/dL (ref 6.0–8.3)

## 2014-03-16 NOTE — Discharge Instructions (Signed)

## 2014-03-16 NOTE — ED Provider Notes (Signed)
Patient seen/examined in the Emergency Department in conjunction with Resident Physician Provider  Lozier Patient reports near syncope while at baseball game.  She is now back to baseline.  She denies cp/sob Exam : awake/alert, no distress, watching TV Plan: stable for d/c home.  I doubt cardiac dysrhythmia as cause of syncope   Sharyon Cable, MD 03/16/14 [id number]

## 2014-03-16 NOTE — ED Provider Notes (Signed)
CSN: [id number]     Arrival date & time 03/16/14  1952 History   First MD Initiated Contact with Patient 03/16/14 2008     Chief Complaint  Patient presents with  . Near Syncope     (Consider location/radiation/quality/duration/timing/severity/associated sxs/prior Treatment) HPI [place] 68 y.o. with a reported history of a TIA presents with lightheadedness, nausea, generalized weakness, and right hand weakness and numbness. This started at a baseball game today while she was sitting in the grass seats. She reports a twenty year history of exertional chest pain that was worked up 5 years ago with a LHC (which is noted in our records), which was essentially normal. This was followed up with a normal exercise stress test reportedly. Her reported history of a TIA was described as "I was laying in bed and I felt like I was falling into myself. I then didn't know who my husband was or where I was". She denied any focal deficit at that time.   Past Medical History  Diagnosis Date  . GERD (gastroesophageal reflux disease)   . Allergy   . Depression   . Hyperlipidemia   . Osteoporosis   . Anxiety   . Migraine   . DJD (degenerative joint disease)   . Early menopause   . DJD (degenerative joint disease)   . History of pyelonephritis 2009  . 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 Tonsillectomy    . Oophorectomy  1977    due to endometriosis  . Abdominal hysterectomy      due to fibroids  . S/p right ectopic kidney   68 yrs old  . Hx of breast biopsy  1990    neg   Family History  Problem Relation Age of Onset  . Cancer Mother     Cancer  . Hyperlipidemia Mother   . Alcohol abuse Mother   . Cancer Cousin     breast cancer  . Hyperlipidemia Sister   . Diabetes Other     uncle  . Alcohol abuse Other     uncle  . Heart disease Neg Hx   . Stroke Neg Hx   . Hypertension  Neg Hx   . Kidney disease Neg Hx    History  Substance Use Topics  . Smoking status: Never Smoker   . Smokeless tobacco: Never Used  . Alcohol Use: 0.6 oz/week    1 Glasses of wine per week     Comment: wine once a month at most   OB History   Grav Para Term Preterm Abortions TAB SAB Ect Mult Living                 Review of Systems  All other systems reviewed and are negative.     Allergies  Review of patient's allergies indicates no known allergies.  Home Medications   Prior to Admission medications   Medication Sig Start Date End Date Taking? Authorizing Provider  atorvastatin (LIPITOR) 40 MG tablet Take 1 tablet (40 mg total) by mouth daily at 6 PM. 06/06/13  Yes Janith Lima, MD  citalopram (CELEXA) 10 MG tablet Take 10 mg by mouth daily.   Yes Historical Provider, MD  SUMAtriptan (IMITREX) 50 MG tablet Take 50 mg by mouth daily as needed for migraine or headache. May repeat in 2 hours if headache persists or recurs.   Yes  Historical Provider, MD   BP 117/57  Pulse 59  Temp(Src) 97.8 F (36.6 C) (Oral)  Resp 12  Ht 5\' 4"  (1.626 m)  Wt 160 lb (72.576 kg)  BMI 27.45 kg/m2  SpO2 98% Physical Exam  Constitutional: She is oriented to person, place, and time. She appears well-developed and well-nourished. No distress.  HENT:  Head: Normocephalic and atraumatic.  Right Ear: External ear normal.  Left Ear: External ear normal.  Eyes: Conjunctivae and EOM are normal. Right eye exhibits no discharge. Left eye exhibits no discharge.  Neck: Normal range of motion. Neck supple. No JVD present.  Cardiovascular: Normal rate, regular rhythm and normal heart sounds.  Exam reveals no gallop and no friction rub.   No murmur heard. Pulmonary/Chest: Effort normal and breath sounds normal. No stridor. No respiratory distress. She has no wheezes. She has no rales. She exhibits no tenderness.  Abdominal: Soft. Bowel sounds are normal. She exhibits no distension. There is no  tenderness. There is no rebound and no guarding.  Musculoskeletal: Normal range of motion. She exhibits no edema.  Neurological: She is alert and oriented to person, place, and time. No cranial nerve deficit (3-12 grossly intac tbilaterally). GCS eye subscore is 4. GCS verbal subscore is 5. GCS motor subscore is 6.  Strength in flexion and extension at the shoulders, elbows, wrists, hips, knee, and ankle 5/5 bilaterally EHL 5/5 bilaterally Grip strength 5/5 bilaterally Finger to nose, heel toe shin and rapid alternating movements intact bilaterally. Sensation over the dorsum of the hand over the 1st, second and 5th metacarpal, and the lateral forearm and lateral shoulder intact bilaterally.  Sensation over the medial and lateral malleolus, and over the 1st metatarsal intact bilaterally.  Skin: Skin is warm. No rash noted. She is not diaphoretic.  Psychiatric: She has a normal mood and affect. Her behavior is normal.    ED Course  Procedures (including critical care time) Labs Review Labs Reviewed  COMPREHENSIVE METABOLIC PANEL - Abnormal; Notable for the following:    GFR calc non Af Amer 87 (*)    All other components within normal limits  CBC WITH DIFFERENTIAL    Imaging Review No results found.   EKG Interpretation   Date/Time:  Saturday March 16 2014 20:06:26 EDT Ventricular Rate:  60 PR Interval:  155 QRS Duration: 99 QT Interval:  472 QTC Calculation: 472 R Axis:   56 Text Interpretation:  Sinus rhythm t wave flattening Otherwise no  significant change from prior Confirmed by Christy Gentles  MD, [place] ([id number]) on  03/16/2014 8:14:41 PM      MDM   Final diagnoses:  None    Pt with presents with transient lightheadedness, weakness, and right hand numbness and weakness. She reportedly had SBP of 90 with EMS. Nml LHC 5 years ago. What she described as a TIA, does not classically seem to be a TIA. Her symptoms have completely resolved. She reports all similar transient  symptoms with exertion intermittently but not consistently. ECG sig for NSR. No suggestive etiology for syncope. FSBS wnls with EMS. Orthostatics wnls. No electrolyte abn for arrhythmia. Hgb stable and no bleeding. No high risk features. Strong return precautions given for worsening symptoms or any other alarming or concerning symptoms or issues. The patient was in agreement with the treatment plan and I answered all of their questions. The patient was stable for dc. At dc, the patient ambulated without difficulty, was moving all four extremities, symptoms improved, NAD. and AOx4 Care discussed with  my attending, Dr. Christy Gentles. If performed and available, imaging studies and labs reviewed.    Kelby Aline, MD 03/16/14 2329

## 2014-03-16 NOTE — ED Notes (Signed)
Pt brought in by [facility]. Pt c/o of lightheadedness and almost passing out when standing up. EMS vitals: 90/40 when standing up; 58 HR, 97 % on RA. EMS B/P 128/78 upon arrival to [facility] ED. CBG: 91 Pt also c/o of numbness and tingling starting from right armpit radiating to right arm. Pt denies chest pain or shortness of breath.

## 2014-03-17 NOTE — ED Provider Notes (Signed)
I have personally seen and examined the patient.  I have discussed the plan of care with the resident.  I have reviewed the documentation on PMH/FH/Soc. History.  I have reviewed the documentation of the resident and agree.   Sharyon Cable, MD 03/17/14 [phone]

## 2014-04-04 ENCOUNTER — Other Ambulatory Visit: Payer: Self-pay | Admitting: Internal Medicine

## 2014-04-05 ENCOUNTER — Telehealth: Payer: Self-pay | Admitting: Internal Medicine

## 2014-04-05 MED ORDER — CITALOPRAM HYDROBROMIDE 10 MG PO TABS: 10.0000 mg | ORAL_TABLET | Freq: Every day | ORAL | Status: DC

## 2014-04-05 NOTE — Telephone Encounter (Signed)
Patient is requesting refill on citalopran to be sent to [facility] on [address] in [place].  Patient states pharmacy sent request last week.

## 2014-04-05 NOTE — Telephone Encounter (Signed)
Notified pt we haven't received request from [facility], but will send refill electronically...Ashley Cruz

## 2014-10-21 ENCOUNTER — Other Ambulatory Visit: Payer: Self-pay

## 2014-10-21 DIAGNOSIS — Z1231 Encounter for screening mammogram for malignant neoplasm of breast: Secondary | ICD-10-CM

## 2014-10-24 ENCOUNTER — Ambulatory Visit
Admission: RE | Admit: 2014-10-24 | Discharge: 2014-10-24 | Disposition: A | Discharge status: Home / Self Care | Payer: Medicare Other | Source: Ambulatory Visit

## 2014-10-24 DIAGNOSIS — Z1231 Encounter for screening mammogram for malignant neoplasm of breast: Secondary | ICD-10-CM | POA: Diagnosis not present

## 2014-10-26 LAB — HM MAMMOGRAPHY: HM Mammogram: NORMAL

## 2014-11-13 ENCOUNTER — Other Ambulatory Visit: Payer: Self-pay | Admitting: Internal Medicine

## 2014-11-19 ENCOUNTER — Other Ambulatory Visit (INDEPENDENT_AMBULATORY_CARE_PROVIDER_SITE_OTHER): Payer: Medicare Other

## 2014-11-19 ENCOUNTER — Ambulatory Visit (INDEPENDENT_AMBULATORY_CARE_PROVIDER_SITE_OTHER): Payer: Medicare Other | Admitting: Internal Medicine

## 2014-11-19 ENCOUNTER — Ambulatory Visit (INDEPENDENT_AMBULATORY_CARE_PROVIDER_SITE_OTHER)
Admission: RE | Admit: 2014-11-19 | Discharge: 2014-11-19 | Disposition: A | Discharge status: Home / Self Care | Payer: Medicare Other | Source: Ambulatory Visit | Attending: Internal Medicine | Admitting: Internal Medicine

## 2014-11-19 VITALS — BP 128/82 | HR 72 | Temp 98.2°F | Resp 16 | Ht 64.0 in | Wt 166.0 lb

## 2014-11-19 DIAGNOSIS — M1611 Unilateral primary osteoarthritis, right hip: Secondary | ICD-10-CM | POA: Diagnosis not present

## 2014-11-19 DIAGNOSIS — E785 Hyperlipidemia, unspecified: Secondary | ICD-10-CM | POA: Diagnosis not present

## 2014-11-19 DIAGNOSIS — M25551 Pain in right hip: Secondary | ICD-10-CM

## 2014-11-19 DIAGNOSIS — M810 Age-related osteoporosis without current pathological fracture: Secondary | ICD-10-CM

## 2014-11-19 LAB — CBC WITH DIFFERENTIAL/PLATELET
BASOS PCT: 0.5 % (ref 0.0–3.0)
Basophils Absolute: 0 10*3/uL (ref 0.0–0.1)
EOS PCT: 3.2 % (ref 0.0–5.0)
Eosinophils Absolute: 0.2 10*3/uL (ref 0.0–0.7)
HCT: 41.9 % (ref 36.0–46.0)
Hemoglobin: 14.2 g/dL (ref 12.0–15.0)
Lymphocytes Relative: 45 % (ref 12.0–46.0)
Lymphs Abs: 3.1 10*3/uL (ref 0.7–4.0)
MCHC: 33.8 g/dL (ref 30.0–36.0)
MCV: 91.5 fl (ref 78.0–100.0)
MONO ABS: 0.7 10*3/uL (ref 0.1–1.0)
MONOS PCT: 10.9 % (ref 3.0–12.0)
NEUTROS PCT: 40.4 % — AB (ref 43.0–77.0)
Neutro Abs: 2.8 10*3/uL (ref 1.4–7.7)
Platelets: 287 10*3/uL (ref 150.0–400.0)
RBC: 4.58 Mil/uL (ref 3.87–5.11)
RDW: 14.3 % (ref 11.5–15.5)
WBC: 6.8 10*3/uL (ref 4.0–10.5)

## 2014-11-19 LAB — COMPREHENSIVE METABOLIC PANEL
ALT: 26 U/L (ref 0–35)
AST: 20 U/L (ref 0–37)
Albumin: 4.4 g/dL (ref 3.5–5.2)
Alkaline Phosphatase: 77 U/L (ref 39–117)
BILIRUBIN TOTAL: 0.5 mg/dL (ref 0.2–1.2)
BUN: 19 mg/dL (ref 6–23)
CALCIUM: 10.1 mg/dL (ref 8.4–10.5)
CHLORIDE: 106 meq/L (ref 96–112)
CO2: 30 meq/L (ref 19–32)
Creatinine, Ser: 0.88 mg/dL (ref 0.40–1.20)
GFR: 67.99 mL/min (ref >60.00)
GLUCOSE: 75 mg/dL (ref 70–99)
Potassium: 4.2 mEq/L (ref 3.5–5.1)
SODIUM: 141 meq/L (ref 135–145)
TOTAL PROTEIN: 7.7 g/dL (ref 6.0–8.3)

## 2014-11-19 LAB — LIPID PANEL
Cholesterol: 179 mg/dL (ref 0–200)
HDL: 61.7 mg/dL (ref >39.00)
LDL Cholesterol: 88 mg/dL (ref 0–99)
NONHDL: 117.3
TRIGLYCERIDES: 147 mg/dL (ref 0.0–149.0)
Total CHOL/HDL Ratio: 3
VLDL: 29.4 mg/dL (ref 0.0–40.0)

## 2014-11-19 LAB — TSH: TSH: 2.2 u[IU]/mL (ref 0.35–4.50)

## 2014-11-19 MED ORDER — ATORVASTATIN CALCIUM 40 MG PO TABS: 40.0000 mg | ORAL_TABLET | Freq: Every day | ORAL | Status: DC

## 2014-11-19 NOTE — Progress Notes (Signed)
Subjective:    Patient ID: Ashley Cruz, female    DOB: Oct 10, 1945, 69 y.o.   MRN: [id number]  HPI Comments: She returns for f/up and complains of intermittent rt anterior hip pain for 3 months, there was no injury or trauma. The pain is an achy sensation that does not limit her weight-bearing activity. She takes OTC nsaids and gets relief from the pain.   Hyperlipidemia This is a chronic problem. The problem is controlled. Recent lipid tests were reviewed and are variable. Exacerbating diseases include obesity. She has no history of chronic renal disease, diabetes, hypothyroidism, liver disease or nephrotic syndrome. Pertinent negatives include no chest pain, focal sensory loss, focal weakness, leg pain, myalgias or shortness of breath. Current antihyperlipidemic treatment includes statins. The current treatment provides significant improvement of lipids. There are no compliance problems.       Review of Systems  Constitutional: Negative for fever, chills, diaphoresis, appetite change and fatigue.  HENT: Negative.   Eyes: Negative.   Respiratory: Negative.  Negative for cough, choking, chest tightness and shortness of breath.   Cardiovascular: Negative.  Negative for chest pain, palpitations and leg swelling.  Gastrointestinal: Negative.  Negative for abdominal pain.  Endocrine: Negative.   Genitourinary: Negative.   Musculoskeletal: Positive for arthralgias. Negative for myalgias and neck pain.  Skin: Negative.   Allergic/Immunologic: Negative.   Neurological: Negative.  Negative for focal weakness.  Hematological: Negative.  Negative for adenopathy. Does not bruise/bleed easily.  Psychiatric/Behavioral: Negative.   All other systems reviewed and are negative.      Objective:   Physical Exam  Constitutional: She is oriented to person, place, and time. She appears well-developed and well-nourished. No distress.  HENT:  Head: Normocephalic and atraumatic.  Mouth/Throat:  Oropharynx is clear and moist. No oropharyngeal exudate.  Eyes: Conjunctivae are normal. Right eye exhibits no discharge. Left eye exhibits no discharge. No scleral icterus.  Neck: Normal range of motion. Neck supple. No JVD present. No tracheal deviation present. No thyromegaly present.  Cardiovascular: Normal rate, regular rhythm, normal heart sounds and intact distal pulses.  Exam reveals no gallop and no friction rub.   No murmur heard. Pulmonary/Chest: Effort normal and breath sounds normal. No stridor. No respiratory distress. She has no wheezes. She has no rales. She exhibits no tenderness.  Abdominal: Soft. Bowel sounds are normal. She exhibits no distension and no mass. There is no tenderness. There is no rebound and no guarding.  Musculoskeletal: Normal range of motion. She exhibits no edema or tenderness.       Right hip: Normal. She exhibits normal range of motion, normal strength, no tenderness, no bony tenderness, no swelling, no crepitus, no deformity and no laceration.  Lymphadenopathy:    She has no cervical adenopathy.  Neurological: She is oriented to person, place, and time.  Skin: Skin is warm and dry. No rash noted. She is not diaphoretic. No erythema. No pallor.  Vitals reviewed.    Lab Results  Component Value Date   WBC 7.9 03/16/2014   HGB 13.5 03/16/2014   HCT 40.8 03/16/2014   PLT 275 03/16/2014   GLUCOSE 92 03/16/2014   CHOL 313* 06/04/2013   TRIG 152.0* 06/04/2013   HDL 69.00 06/04/2013   LDLDIRECT 234.6 06/04/2013   LDLCALC 96 02/13/2009   ALT 23 03/16/2014   AST 18 03/16/2014   NA 138 03/16/2014   K 4.2 03/16/2014   CL 102 03/16/2014   CREATININE 0.73 03/16/2014   BUN 15 03/16/2014  CO2 22 03/16/2014   TSH 3.03 06/04/2013   INR 1.0 RATIO 04/04/2008   HGBA1C  11/26/2008    5.7 (NOTE) The ADA recommends the following therapeutic goal for glycemic control related to Hgb A1c measurement: Goal of therapy: <6.5 Hgb A1c  Reference: American Diabetes  Association: Clinical Practice Recommendations 2010, Diabetes Care, 2010, 33: (Suppl  1).       Assessment & Plan:

## 2014-11-19 NOTE — Progress Notes (Signed)
Pre visit review using our clinic review tool, if applicable. No additional management support is needed unless otherwise documented below in the visit note. 

## 2014-11-19 NOTE — Patient Instructions (Signed)
Hip Pain Your hip is the joint between your upper legs and your lower pelvis. The bones, cartilage, tendons, and muscles of your hip joint perform a lot of work each day supporting your body weight and allowing you to move around. Hip pain can range from a minor ache to severe pain in one or both of your hips. Pain may be felt on the inside of the hip joint near the groin, or the outside near the buttocks and upper thigh. You may have swelling or stiffness as well.  HOME CARE INSTRUCTIONS   Take medicines only as directed by your health care provider.  Apply ice to the injured area:  Put ice in a plastic bag.  Place a towel between your skin and the bag.  Leave the ice on for 15-20 minutes at a time, 3-4 times a day.  Keep your leg raised (elevated) when possible to lessen swelling.  Avoid activities that cause pain.  Follow specific exercises as directed by your health care provider.  Sleep with a pillow between your legs on your most comfortable side.  Record how often you have hip pain, the location of the pain, and what it feels like. SEEK MEDICAL CARE IF:   You are unable to put weight on your leg.  Your hip is red or swollen or very tender to touch.  Your pain or swelling continues or worsens after 1 week.  You have increasing difficulty walking.  You have a fever. SEEK IMMEDIATE MEDICAL CARE IF:   You have fallen.  You have a sudden increase in pain and swelling in your hip. MAKE SURE YOU:   Understand these instructions.  Will watch your condition.  Will get help right away if you are not doing well or get worse. Document Released: 12/23/2009 Document Revised: 11/19/2013 Document Reviewed: 03/01/2013 ExitCare Patient Information 2015 [employer]. This information is not intended to replace advice given to you by your health care provider. Make sure you discuss any questions you have with your health care provider.  

## 2014-11-20 ENCOUNTER — Encounter: Payer: Self-pay | Admitting: Internal Medicine

## 2014-11-20 NOTE — Assessment & Plan Note (Signed)
She has achieved her LDL goal and is doing well on the statin CMP and TSh are WNL

## 2014-11-20 NOTE — Assessment & Plan Note (Signed)
Her exam is WNL I think she has DJD - will cont OTC pain management Will check a plain film today to see how severe this is

## 2014-11-20 NOTE — Assessment & Plan Note (Signed)
She is due for a DEXA scan 

## 2014-11-27 DIAGNOSIS — H2513 Age-related nuclear cataract, bilateral: Secondary | ICD-10-CM | POA: Diagnosis not present

## 2014-12-16 DIAGNOSIS — L309 Dermatitis, unspecified: Secondary | ICD-10-CM | POA: Diagnosis not present

## 2014-12-31 ENCOUNTER — Ambulatory Visit: Payer: Medicare Other | Admitting: Internal Medicine

## 2015-01-14 ENCOUNTER — Encounter: Payer: Self-pay | Admitting: Internal Medicine

## 2015-01-25 ENCOUNTER — Encounter: Payer: Self-pay | Admitting: Internal Medicine

## 2015-01-27 ENCOUNTER — Encounter: Payer: Self-pay | Admitting: Internal Medicine

## 2015-03-30 ENCOUNTER — Other Ambulatory Visit: Payer: Self-pay | Admitting: Internal Medicine

## 2015-03-31 ENCOUNTER — Encounter: Payer: Self-pay | Admitting: Internal Medicine

## 2015-05-28 ENCOUNTER — Other Ambulatory Visit: Payer: Self-pay | Admitting: Internal Medicine

## 2015-10-06 ENCOUNTER — Other Ambulatory Visit: Payer: Self-pay | Admitting: Internal Medicine

## 2015-10-18 ENCOUNTER — Encounter: Payer: Self-pay | Admitting: Internal Medicine

## 2015-10-20 ENCOUNTER — Other Ambulatory Visit: Payer: Self-pay | Admitting: Internal Medicine

## 2015-10-20 DIAGNOSIS — L989 Disorder of the skin and subcutaneous tissue, unspecified: Secondary | ICD-10-CM | POA: Insufficient documentation

## 2015-10-20 IMAGING — CR DG HIP (WITH OR WITHOUT PELVIS) 2-3V*R*
2 series · 2 of 2 positions shown · non-contrast
Comparison: None.

CLINICAL DATA: Pain.  No known injury.  Initial evaluation.

EXAM:
RIGHT HIP (WITH PELVIS) 2-3 VIEWS

[view not recorded (1 of 2)]
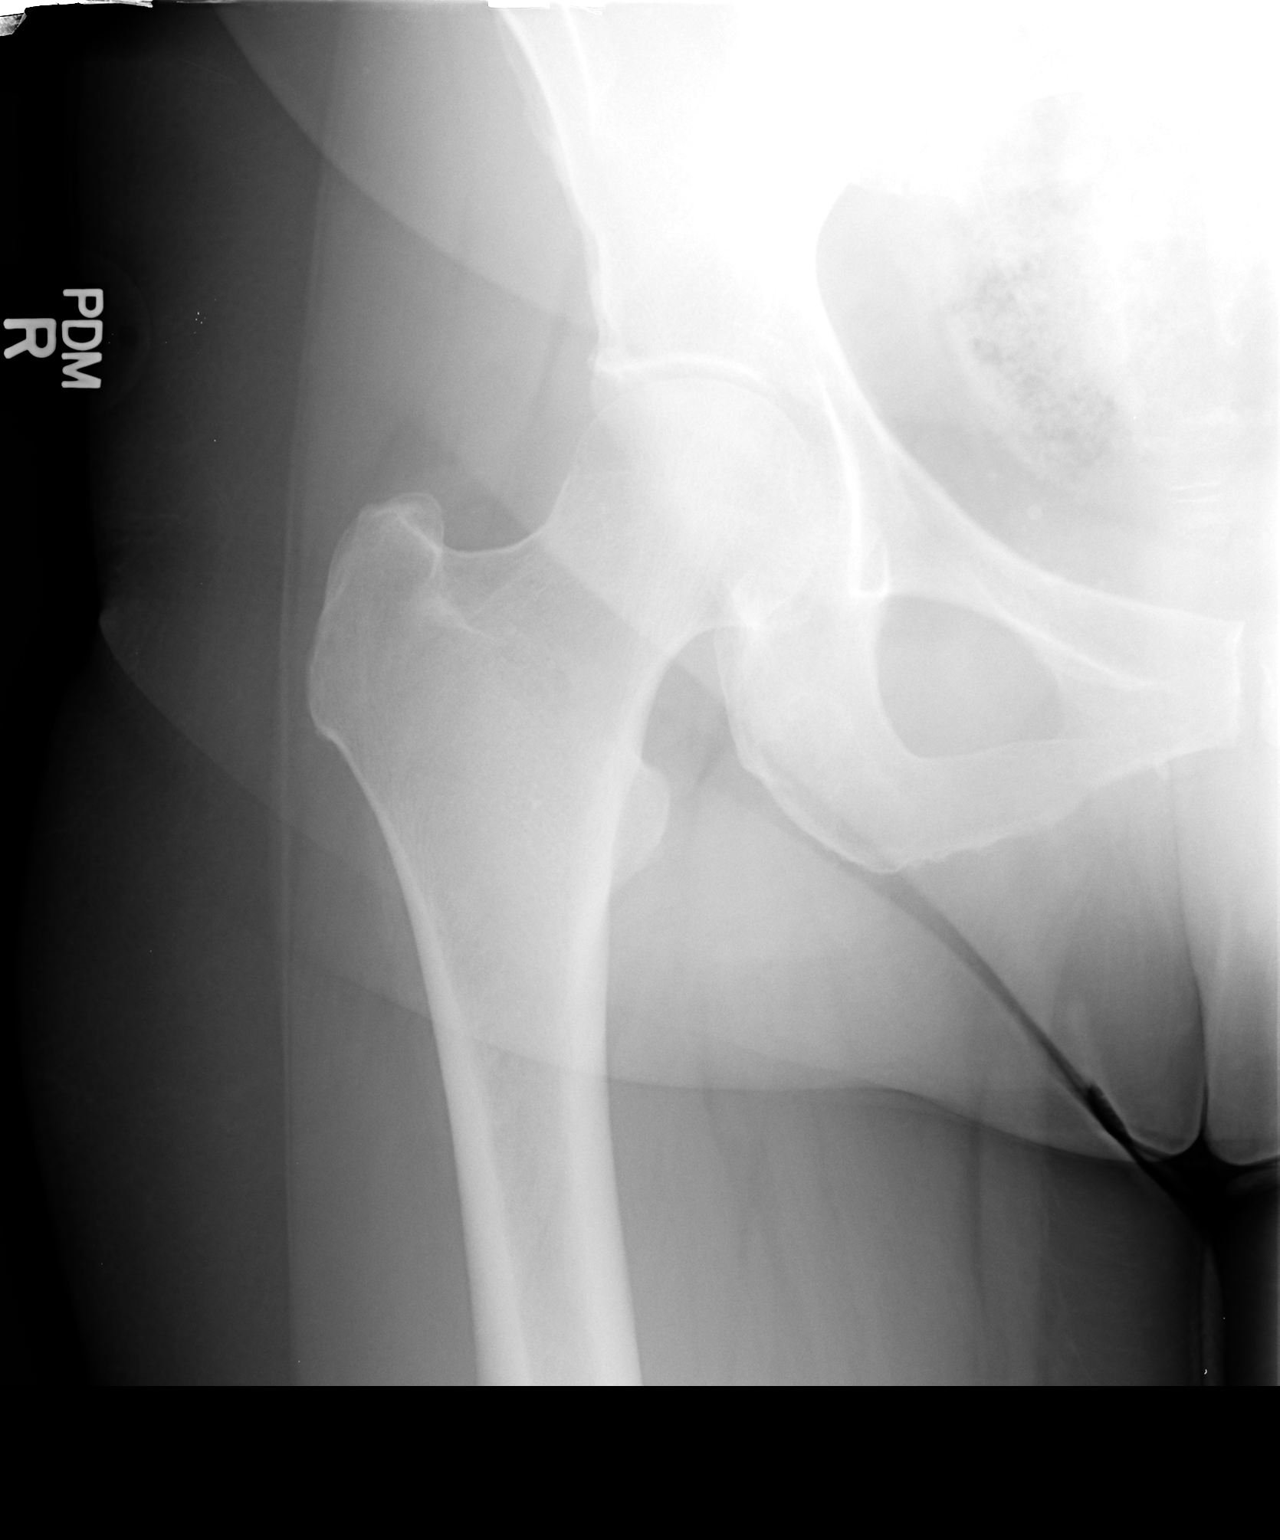

[view not recorded (2 of 2)]
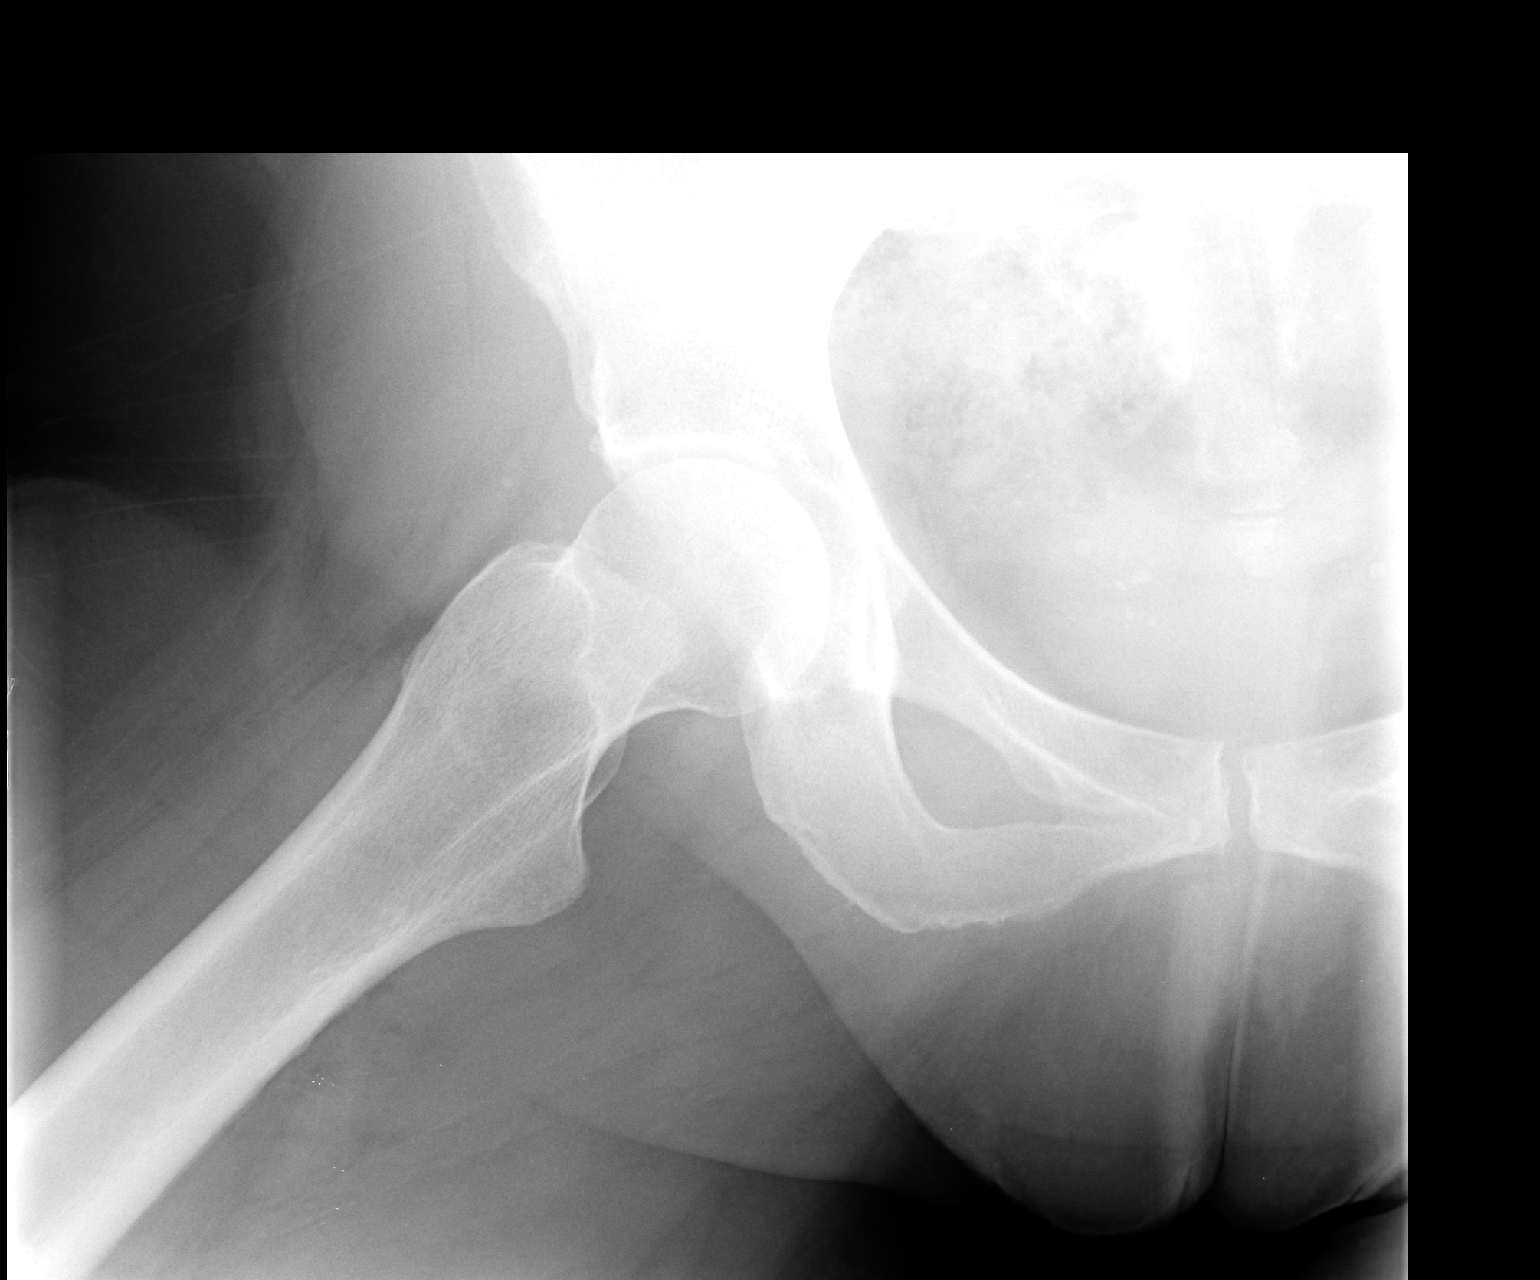

[2 of 2 positions shown; findings below may reference images not displayed]

FINDINGS: Mild degenerative changes right hip. No acute or focal bony
abnormality.
IMPRESSION: Mild degenerative changes right hip.  No acute abnormality.

## 2015-11-05 ENCOUNTER — Encounter: Payer: Self-pay | Admitting: Internal Medicine

## 2015-11-10 DIAGNOSIS — L814 Other melanin hyperpigmentation: Secondary | ICD-10-CM | POA: Diagnosis not present

## 2015-11-10 DIAGNOSIS — L821 Other seborrheic keratosis: Secondary | ICD-10-CM | POA: Diagnosis not present

## 2015-11-10 DIAGNOSIS — I781 Nevus, non-neoplastic: Secondary | ICD-10-CM | POA: Diagnosis not present

## 2016-02-27 ENCOUNTER — Other Ambulatory Visit: Payer: Self-pay | Admitting: Internal Medicine

## 2016-02-27 DIAGNOSIS — E785 Hyperlipidemia, unspecified: Secondary | ICD-10-CM

## 2016-03-08 ENCOUNTER — Encounter: Payer: Self-pay | Admitting: Internal Medicine

## 2016-03-08 ENCOUNTER — Other Ambulatory Visit: Payer: Self-pay | Admitting: Internal Medicine

## 2016-03-08 DIAGNOSIS — E785 Hyperlipidemia, unspecified: Secondary | ICD-10-CM

## 2016-03-11 ENCOUNTER — Encounter: Payer: Self-pay | Admitting: Internal Medicine

## 2016-03-12 ENCOUNTER — Encounter: Payer: Self-pay | Admitting: Internal Medicine

## 2016-04-12 ENCOUNTER — Encounter: Payer: Self-pay | Admitting: Internal Medicine

## 2016-04-13 MED ORDER — CITALOPRAM HYDROBROMIDE 10 MG PO TABS: 10.0000 mg | ORAL_TABLET | Freq: Every day | ORAL | 0 refills | Status: DC

## 2016-04-14 ENCOUNTER — Ambulatory Visit (INDEPENDENT_AMBULATORY_CARE_PROVIDER_SITE_OTHER): Payer: Medicare Other | Admitting: Internal Medicine

## 2016-04-14 ENCOUNTER — Other Ambulatory Visit (INDEPENDENT_AMBULATORY_CARE_PROVIDER_SITE_OTHER): Payer: Medicare Other

## 2016-04-14 ENCOUNTER — Encounter: Payer: Self-pay | Admitting: Internal Medicine

## 2016-04-14 ENCOUNTER — Telehealth: Payer: Self-pay | Admitting: Internal Medicine

## 2016-04-14 VITALS — BP 132/80 | HR 71 | Temp 98.1°F | Ht 64.0 in | Wt 167.0 lb

## 2016-04-14 DIAGNOSIS — Z23 Encounter for immunization: Secondary | ICD-10-CM

## 2016-04-14 DIAGNOSIS — Z1159 Encounter for screening for other viral diseases: Secondary | ICD-10-CM

## 2016-04-14 DIAGNOSIS — E785 Hyperlipidemia, unspecified: Secondary | ICD-10-CM

## 2016-04-14 DIAGNOSIS — M810 Age-related osteoporosis without current pathological fracture: Secondary | ICD-10-CM

## 2016-04-14 DIAGNOSIS — L989 Disorder of the skin and subcutaneous tissue, unspecified: Secondary | ICD-10-CM | POA: Diagnosis not present

## 2016-04-14 DIAGNOSIS — E2839 Other primary ovarian failure: Secondary | ICD-10-CM | POA: Insufficient documentation

## 2016-04-14 LAB — CBC WITH DIFFERENTIAL/PLATELET
BASOS ABS: 0.1 10*3/uL (ref 0.0–0.1)
Basophils Relative: 0.6 % (ref 0.0–3.0)
Eosinophils Absolute: 0.2 10*3/uL (ref 0.0–0.7)
Eosinophils Relative: 2.8 % (ref 0.0–5.0)
HCT: 44.1 % (ref 36.0–46.0)
HEMOGLOBIN: 14.9 g/dL (ref 12.0–15.0)
LYMPHS PCT: 37.2 % (ref 12.0–46.0)
Lymphs Abs: 3 10*3/uL (ref 0.7–4.0)
MCHC: 33.8 g/dL (ref 30.0–36.0)
MCV: 91.6 fl (ref 78.0–100.0)
MONOS PCT: 10.8 % (ref 3.0–12.0)
Monocytes Absolute: 0.9 10*3/uL (ref 0.1–1.0)
NEUTROS PCT: 48.6 % (ref 43.0–77.0)
Neutro Abs: 4 10*3/uL (ref 1.4–7.7)
Platelets: 287 10*3/uL (ref 150.0–400.0)
RBC: 4.82 Mil/uL (ref 3.87–5.11)
RDW: 14.4 % (ref 11.5–15.5)
WBC: 8.2 10*3/uL (ref 4.0–10.5)

## 2016-04-14 LAB — COMPREHENSIVE METABOLIC PANEL
ALT: 22 U/L (ref 0–35)
AST: 17 U/L (ref 0–37)
Albumin: 4.3 g/dL (ref 3.5–5.2)
Alkaline Phosphatase: 59 U/L (ref 39–117)
BUN: 27 mg/dL — ABNORMAL HIGH (ref 6–23)
CALCIUM: 9.7 mg/dL (ref 8.4–10.5)
CHLORIDE: 104 meq/L (ref 96–112)
CO2: 27 meq/L (ref 19–32)
CREATININE: 1.03 mg/dL (ref 0.40–1.20)
GFR: 56.46 mL/min — ABNORMAL LOW (ref >60.00)
Glucose, Bld: 92 mg/dL (ref 70–99)
POTASSIUM: 4.2 meq/L (ref 3.5–5.1)
SODIUM: 140 meq/L (ref 135–145)
Total Bilirubin: 0.4 mg/dL (ref 0.2–1.2)
Total Protein: 7.5 g/dL (ref 6.0–8.3)

## 2016-04-14 LAB — LIPID PANEL
CHOL/HDL RATIO: 5
Cholesterol: 329 mg/dL — ABNORMAL HIGH (ref 0–200)
HDL: 63.8 mg/dL (ref >39.00)
NONHDL: 265.52
TRIGLYCERIDES: 211 mg/dL — AB (ref 0.0–149.0)
VLDL: 42.2 mg/dL — ABNORMAL HIGH (ref 0.0–40.0)

## 2016-04-14 LAB — TSH: TSH: 2.79 u[IU]/mL (ref 0.35–4.50)

## 2016-04-14 LAB — HEPATITIS C ANTIBODY: HCV Ab: NEGATIVE

## 2016-04-14 LAB — LDL CHOLESTEROL, DIRECT: LDL DIRECT: 232 mg/dL

## 2016-04-14 MED ORDER — SUMATRIPTAN SUCCINATE 50 MG PO TABS: 50.0000 mg | ORAL_TABLET | ORAL | 4 refills | Status: DC | PRN

## 2016-04-14 MED ORDER — CITALOPRAM HYDROBROMIDE 10 MG PO TABS: 10.0000 mg | ORAL_TABLET | Freq: Every day | ORAL | 1 refills | Status: DC

## 2016-04-14 MED ORDER — ATORVASTATIN CALCIUM 40 MG PO TABS: 40.0000 mg | ORAL_TABLET | Freq: Every day | ORAL | 1 refills | Status: DC

## 2016-04-14 NOTE — Telephone Encounter (Signed)
Patient would like to know if HEP C screening can be added to labs she just gave?

## 2016-04-14 NOTE — Progress Notes (Signed)
Subjective:  Patient ID: Ashley Cruz, female    DOB: 1945-12-08  Age: 70 y.o. MRN: [id number]  CC: Hyperlipidemia   HPI [place] presents for follow-up on familial hypercholesterolemia. She ran out of atorvastatin about a month ago and comes in for follow-up. She says when she was taking atorvastatin it did not cause any symptoms such as muscle aches, joint aches, abdominal pain, nausea, or vomiting.  She complains about spots on her face and wants to be referred to a dermatologist.  Outpatient Medications Prior to Visit  Medication Sig Dispense Refill  . atorvastatin (LIPITOR) 40 MG tablet Take 1 tablet (40 mg total) by mouth daily at 6 PM. 90 tablet 1  . citalopram (CELEXA) 10 MG tablet Take 1 tablet (10 mg total) by mouth daily. 30 tablet 0  . SUMAtriptan (IMITREX) 50 MG tablet TAKE 1 TABLET BY MOUTH ONCE DAILY AS NEEDED FOR HEADACHES 12 tablet 4  . SUMAtriptan (IMITREX) 50 MG tablet TAKE 1 TABLET BY MOUTH EVERY DAY AS NEEDED FOR HEADACHES 30 tablet 2   No facility-administered medications prior to visit.     ROS Review of Systems  Constitutional: Negative.  Negative for appetite change, chills, diaphoresis, fatigue and fever.  HENT: Negative.  Negative for trouble swallowing.   Eyes: Negative.  Negative for visual disturbance.  Respiratory: Negative for cough, choking, chest tightness, shortness of breath and stridor.   Cardiovascular: Negative.  Negative for chest pain, palpitations and leg swelling.  Gastrointestinal: Negative.  Negative for abdominal pain, constipation, diarrhea, nausea and vomiting.  Endocrine: Negative.   Genitourinary: Negative.  Negative for decreased urine volume, difficulty urinating, dysuria, frequency and urgency.  Musculoskeletal: Negative.  Negative for arthralgias, back pain, myalgias and neck pain.  Skin: Negative.  Negative for color change and rash.  Allergic/Immunologic: Negative.   Neurological: Negative.  Negative for dizziness,  weakness and headaches.  Hematological: Negative.  Negative for adenopathy. Does not bruise/bleed easily.    Objective:  BP 132/80 (BP Location: Left Arm, Patient Position: Sitting, Cuff Size: Normal)   Pulse 71   Temp 98.1 F (36.7 C) (Oral)   Ht 5\' 4"  (1.626 m)   Wt 167 lb (75.8 kg)   SpO2 97%   BMI 28.67 kg/m   BP Readings from Last 3 Encounters:  04/14/16 132/80  11/19/14 128/82  03/16/14 118/58    Wt Readings from Last 3 Encounters:  04/14/16 167 lb (75.8 kg)  11/19/14 166 lb (75.3 kg)  03/16/14 160 lb (72.6 kg)    Physical Exam  Constitutional: She is oriented to person, place, and time. She appears well-developed and well-nourished. No distress.  HENT:  Mouth/Throat: Oropharynx is clear and moist. No oropharyngeal exudate.  Eyes: Conjunctivae are normal. Right eye exhibits no discharge. Left eye exhibits no discharge. No scleral icterus.  Neck: Normal range of motion. Neck supple. No JVD present. No tracheal deviation present. No thyromegaly present.  Cardiovascular: Normal rate, regular rhythm, normal heart sounds and intact distal pulses.  Exam reveals no gallop and no friction rub.   No murmur heard. Pulmonary/Chest: Effort normal and breath sounds normal. No stridor. No respiratory distress. She has no wheezes. She has no rales. She exhibits no tenderness.  Abdominal: Soft. Bowel sounds are normal. She exhibits no distension and no mass. There is no tenderness. There is no rebound and no guarding.  Musculoskeletal: Normal range of motion. She exhibits no edema, tenderness or deformity.  Lymphadenopathy:    She has no cervical adenopathy.  Neurological: She is oriented to person, place, and time.  Skin: Skin is warm, dry and intact. Lesion noted. No rash noted. She is not diaphoretic. No pallor.  There are a few seborrheic keratosis on both malar surfaces, the one on the right is larger than the one on the left.  Vitals reviewed.   Lab Results  Component Value  Date   WBC 6.8 11/19/2014   HGB 14.2 11/19/2014   HCT 41.9 11/19/2014   PLT 287.0 11/19/2014   GLUCOSE 75 11/19/2014   CHOL 179 11/19/2014   TRIG 147.0 11/19/2014   HDL 61.70 11/19/2014   LDLDIRECT 234.6 06/04/2013   LDLCALC 88 11/19/2014   ALT 26 11/19/2014   AST 20 11/19/2014   NA 141 11/19/2014   K 4.2 11/19/2014   CL 106 11/19/2014   CREATININE 0.88 11/19/2014   BUN 19 11/19/2014   CO2 30 11/19/2014   TSH 2.20 11/19/2014   INR 1.0 RATIO 04/04/2008   HGBA1C  11/26/2008    5.7 (NOTE) The ADA recommends the following therapeutic goal for glycemic control related to Hgb A1c measurement: Goal of therapy: <6.5 Hgb A1c  Reference: American Diabetes Association: Clinical Practice Recommendations 2010, Diabetes Care, 2010, 33: (Suppl  1).    Dg Hip Unilat With Pelvis 2-3 Views Right  Result Date: 11/20/2014 CLINICAL DATA:  Pain.  No known injury.  Initial evaluation. EXAM: RIGHT HIP (WITH PELVIS) 2-3 VIEWS COMPARISON:  None. FINDINGS: Mild degenerative changes right hip. No acute or focal bony abnormality. IMPRESSION: Mild degenerative changes right hip.  No acute abnormality. Electronically Signed   By: Marcello Moores  Register   On: 11/20/2014 07:23    Assessment & Plan:   [name] was seen today for hyperlipidemia.  Diagnoses and all orders for this visit:  Osteoporosis, senile- she is due for a follow-up DEXA scan -     DG Bone Density; Future  Hyperlipidemia with target LDL less than 130- her LDL is not well controlled, will restart statin therapy, her labs are negative for any secondary metabolic causes, I've asked her return in a few months to recheck her LDL to see if she has achieved her goal. -     Lipid panel; Future -     CBC with Differential/Platelet; Future -     TSH; Future -     Comprehensive metabolic panel; Future -     atorvastatin (LIPITOR) 40 MG tablet; Take 1 tablet (40 mg total) by mouth daily at 6 PM.  Need for prophylactic vaccination and inoculation  against influenza -     Flu vaccine HIGH DOSE PF (Fluzone High dose)  Skin lesion-  -     Ambulatory referral to Dermatology  Estrogen deficiency -     DG Bone Density; Future  Need for hepatitis C screening test -     Hepatitis C antibody; Future  Other orders -     SUMAtriptan (IMITREX) 50 MG tablet; Take 1 tablet (50 mg total) by mouth every 2 (two) hours as needed for migraine. -     citalopram (CELEXA) 10 MG tablet; Take 1 tablet (10 mg total) by mouth daily.   I have discontinued Ms. Outman's SUMAtriptan. I have also changed her SUMAtriptan. Additionally, I am having her maintain her citalopram and atorvastatin.  Meds ordered this encounter  Medications  . SUMAtriptan (IMITREX) 50 MG tablet    Sig: Take 1 tablet (50 mg total) by mouth every 2 (two) hours as needed for migraine.  Dispense:  12 tablet    Refill:  4  . citalopram (CELEXA) 10 MG tablet    Sig: Take 1 tablet (10 mg total) by mouth daily.    Dispense:  90 tablet    Refill:  1  . atorvastatin (LIPITOR) 40 MG tablet    Sig: Take 1 tablet (40 mg total) by mouth daily at 6 PM.    Dispense:  90 tablet    Refill:  1     Follow-up: Return in about 6 months (around 10/12/2016).  Scarlette Calico, MD

## 2016-04-14 NOTE — Patient Instructions (Signed)

## 2016-04-14 NOTE — Progress Notes (Signed)
Pre visit review using our clinic review tool, if applicable. No additional management support is needed unless otherwise documented below in the visit note. 

## 2016-04-15 ENCOUNTER — Encounter: Payer: Self-pay | Admitting: Internal Medicine

## 2016-04-15 NOTE — Telephone Encounter (Signed)
LVM for pt to call back as soon as possible.   RE: that lab was done yesterday. Results are in and normal.

## 2016-05-12 ENCOUNTER — Encounter: Payer: Self-pay | Admitting: Internal Medicine

## 2016-05-12 DIAGNOSIS — Z1239 Encounter for other screening for malignant neoplasm of breast: Secondary | ICD-10-CM

## 2016-05-13 ENCOUNTER — Other Ambulatory Visit: Payer: Self-pay | Admitting: Internal Medicine

## 2016-05-13 MED ORDER — CITALOPRAM HYDROBROMIDE 10 MG PO TABS: 10.0000 mg | ORAL_TABLET | Freq: Every day | ORAL | 3 refills | Status: DC

## 2016-05-17 ENCOUNTER — Ambulatory Visit (INDEPENDENT_AMBULATORY_CARE_PROVIDER_SITE_OTHER)
Admission: RE | Admit: 2016-05-17 | Discharge: 2016-05-17 | Disposition: A | Discharge status: Home / Self Care | Payer: Medicare Other | Source: Ambulatory Visit | Attending: Internal Medicine | Admitting: Internal Medicine

## 2016-05-17 DIAGNOSIS — M810 Age-related osteoporosis without current pathological fracture: Secondary | ICD-10-CM

## 2016-05-17 DIAGNOSIS — L918 Other hypertrophic disorders of the skin: Secondary | ICD-10-CM | POA: Diagnosis not present

## 2016-05-17 DIAGNOSIS — E2839 Other primary ovarian failure: Secondary | ICD-10-CM | POA: Diagnosis not present

## 2016-05-17 DIAGNOSIS — L570 Actinic keratosis: Secondary | ICD-10-CM | POA: Diagnosis not present

## 2016-05-17 DIAGNOSIS — L821 Other seborrheic keratosis: Secondary | ICD-10-CM | POA: Diagnosis not present

## 2016-05-17 DIAGNOSIS — D1801 Hemangioma of skin and subcutaneous tissue: Secondary | ICD-10-CM | POA: Diagnosis not present

## 2016-05-24 ENCOUNTER — Encounter: Payer: Self-pay | Admitting: Internal Medicine

## 2016-05-24 ENCOUNTER — Other Ambulatory Visit: Payer: Self-pay | Admitting: Internal Medicine

## 2016-05-24 DIAGNOSIS — M810 Age-related osteoporosis without current pathological fracture: Secondary | ICD-10-CM

## 2016-05-24 LAB — HM DEXA SCAN: HM Dexa Scan: -1.7

## 2016-05-24 MED ORDER — FOSTEUM PLUS PO CAPS: 1.0000 | ORAL_CAPSULE | Freq: Two times a day (BID) | ORAL | 11 refills | Status: DC

## 2016-05-27 ENCOUNTER — Encounter: Payer: Self-pay | Admitting: Internal Medicine

## 2016-05-27 DIAGNOSIS — D224 Melanocytic nevi of scalp and neck: Secondary | ICD-10-CM | POA: Diagnosis not present

## 2016-05-27 DIAGNOSIS — L989 Disorder of the skin and subcutaneous tissue, unspecified: Secondary | ICD-10-CM | POA: Diagnosis not present

## 2016-05-27 DIAGNOSIS — Z411 Encounter for cosmetic surgery: Secondary | ICD-10-CM | POA: Diagnosis not present

## 2016-05-28 DIAGNOSIS — D224 Melanocytic nevi of scalp and neck: Secondary | ICD-10-CM | POA: Diagnosis not present

## 2016-06-22 DIAGNOSIS — L821 Other seborrheic keratosis: Secondary | ICD-10-CM | POA: Diagnosis not present

## 2016-06-22 DIAGNOSIS — Z23 Encounter for immunization: Secondary | ICD-10-CM | POA: Diagnosis not present

## 2016-06-22 DIAGNOSIS — L570 Actinic keratosis: Secondary | ICD-10-CM | POA: Diagnosis not present

## 2016-11-03 ENCOUNTER — Ambulatory Visit (INDEPENDENT_AMBULATORY_CARE_PROVIDER_SITE_OTHER)
Admission: RE | Admit: 2016-11-03 | Discharge: 2016-11-03 | Disposition: A | Discharge status: Home / Self Care | Payer: Medicare Other | Source: Ambulatory Visit | Attending: Internal Medicine | Admitting: Internal Medicine

## 2016-11-03 ENCOUNTER — Telehealth: Payer: Self-pay

## 2016-11-03 ENCOUNTER — Ambulatory Visit (INDEPENDENT_AMBULATORY_CARE_PROVIDER_SITE_OTHER): Payer: Medicare Other | Admitting: *Deleted

## 2016-11-03 VITALS — BP 144/82 | HR 67 | Resp 20 | Ht 64.0 in | Wt 169.0 lb

## 2016-11-03 DIAGNOSIS — M25551 Pain in right hip: Secondary | ICD-10-CM

## 2016-11-03 DIAGNOSIS — Z0000 Encounter for general adult medical examination without abnormal findings: Secondary | ICD-10-CM | POA: Diagnosis not present

## 2016-11-03 NOTE — Progress Notes (Signed)
Pre visit review using our clinic review tool, if applicable. No additional management support is needed unless otherwise documented below in the visit note. 

## 2016-11-03 NOTE — Progress Notes (Addendum)
Subjective:   Ashley Cruz is a 71 y.o. female who presents for Medicare Annual (Subsequent) preventive examination. Patient reports right hip pain that started approximately 3 months ago. The pain is intermittent, pain level 2 currently and radiates from front to back of hip.  Review of Systems:  No ROS.  Medicare Wellness Visit.  Cardiac Risk Factors include: advanced age (>3mn, >>69women);dyslipidemia;family history of premature cardiovascular disease Sleep patterns:  gets up 1 times nightly to void and sleeps 4-6 hours nightly.   Patient reports long-term insomnia issues, reviewed recommended sleep tips and stress reduction tips of which patient currently implements at home. Home Safety/Smoke Alarms:  Feels safe in home. Smoke alarms in place.   Living environment; residence and Firearm Safety: 2-story house, no firearms. Lives with husband Seat Belt Safety/Bike Helmet: Wears seat belt.   Counseling:   Eye Exam- appointment 2 years ago, patient states she will make an appointment  Dental- Last within 2 years, affordable resources provided  Female:   Pap- N/A      Mammo- Last 10/26/14, upcoming appointment 11/19/16        Dexa scan- Last 05/24/16, T-score -1.7        CCS- Last 07/13/13, colorgaurd kit ordered today    Objective:     Vitals: BP (!) 144/82   Pulse 67   Resp 20   Ht _0  (1.626 m)   Wt 169 lb (76.7 kg)   SpO2 98%   BMI 29.01 kg/m   Body mass index is 29.01 kg/m.   Tobacco History  Smoking Status  . Never Smoker  Smokeless Tobacco  . Never Used     Counseling given: Not Answered   Past Medical History:  Diagnosis Date  . Allergy   . Anxiety   . Depression   . DJD (degenerative joint disease)   . DJD (degenerative joint disease)   . Early menopause   . 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 GERD (gastroesophageal reflux disease)   . History of pyelonephritis 2009  . Hyperlipidemia   .  Migraine   . Osteoporosis    Past Surgical History:  Procedure Laterality Date  . ABDOMINAL HYSTERECTOMY     due to fibroids  . hx of breast biopsy  1990   neg  . OOPHORECTOMY  1977   due to endometriosis  . s/p Right ectopic kidney   71yrs old  . TONSILLECTOMY     Family History  Problem Relation Age of Onset  . Cancer Mother     Cancer  . Hyperlipidemia Mother   . Alcohol abuse Mother   . Hyperlipidemia Sister   . Cancer Cousin     breast cancer  . Diabetes Other     uncle  . Alcohol abuse Other     uncle  . Heart disease Neg Hx   . Stroke Neg Hx   . Hypertension Neg Hx   . Kidney disease Neg Hx    History  Sexual Activity  . Sexual activity: Yes  . Birth control/ protection: Surgical    Outpatient Encounter Prescriptions as of 11/03/2016  Medication Sig  . atorvastatin (LIPITOR) 40 MG tablet Take 1 tablet (40 mg total) by mouth daily at 6 PM.  . citalopram (CELEXA) 10 MG tablet Take 1 tablet (10 mg total) by mouth daily.  .Marland Kitchenibuprofen (ADVIL,MOTRIN) 200 MG tablet Take 200 mg by mouth every 6 (six)  hours as needed.  . Ibuprofen-Diphenhydramine Cit (ADVIL PM PO) Take 1 tablet by mouth.  . SUMAtriptan (IMITREX) 50 MG tablet Take 1 tablet (50 mg total) by mouth every 2 (two) hours as needed for migraine.  . [DISCONTINUED] Dietary Management Product (FOSTEUM PLUS) CAPS Take 1 capsule by mouth 2 (two) times daily. (Patient not taking: Reported on 11/03/2016)   No facility-administered encounter medications on file as of 11/03/2016.     Activities of Daily Living In your present state of health, do you have any difficulty performing the following activities: 11/03/2016  Hearing? Y  Vision? N  Difficulty concentrating or making decisions? N  Walking or climbing stairs? N  Dressing or bathing? N  Doing errands, shopping? N  Preparing Food and eating ? N  Using the Toilet? N  In the past six months, have you accidently leaked urine? N  Do you have problems with loss  of bowel control? N  Managing your Medications? N  Managing your Finances? N  Housekeeping or managing your Housekeeping? N  Some recent data might be hidden    Patient Care Team: Janith Lima, MD as PCP - General    Assessment:    Physical assessment deferred to PCP.  Exercise Activities and Dietary recommendations Current Exercise Habits: The patient does not participate in regular exercise at present, Exercise limited by: None identified  Diet (meal preparation, eat out, water intake, caffeinated beverages, dairy products, fruits and vegetables): in general, a "healthy" diet  , well balanced, low fat/ cholesterol, low salt Reports recent 5 pound weight loss, drinks 4-5 bottles of water daily, limits caffeine and sugar drinks.  Encouraged patient to read food labels and discussed portion control   Goals    . Lose 10 pounds          Monitor diet and walk daily with my dog      Fall Risk Fall Risk  11/03/2016  Falls in the past year? No   Depression Screen PHQ 2/9 Scores 11/03/2016 11/03/2016  PHQ - 2 Score 1 1  PHQ- 9 Score 4 -     Cognitive Function       Ad8 score reviewed for issues:  Issues making decisions: no  Less interest in hobbies / activities: no  Repeats questions, stories (family complaining): no  Trouble using ordinary gadgets (microwave, computer, phone): no  Forgets the month or year: no  Mismanaging finances: no  Remembering appts: no  Daily problems with thinking and/or memory: no Ad8 score is= 0  Immunization History  Administered Date(s) Administered  . Influenza, High Dose Seasonal PF 06/04/2013, 04/14/2016  . Pneumococcal Conjugate-13 06/04/2013  . Tdap 06/04/2013   Screening Tests Health Maintenance  Topic Date Due  . MAMMOGRAM  10/25/2016  . PNA vac Low Risk Adult (2 of 2 - PPSV23) 03/19/2017 (Originally 06/04/2014)  . INFLUENZA VACCINE  02/16/2017  . TETANUS/TDAP  06/05/2023  . COLONOSCOPY  07/14/2023  . DEXA SCAN   Completed  . Hepatitis C Screening  Completed      Plan:     PCP informed of right hip pain and hip x-ray was ordered to evaluate.  Continue to eat heart healthy diet (full of fruits, vegetables, whole grains, lean protein, water--limit salt, fat, and sugar intake) and increase physical activity as tolerated.  Continue doing brain stimulating activities (puzzles, reading, adult coloring books, staying active) to keep memory sharp.   During the course of the visit the patient was educated and counseled about  the following appropriate screening and preventive services:   Vaccines to include Pneumoccal, Influenza, Hepatitis B, Td, Zostavax, HCV  Cardiovascular Disease  Colorectal cancer screening  Bone density screening  Diabetes screening  Glaucoma screening  Mammography/PAP  Nutrition counseling   Patient Instructions (the written plan) was given to the patient.   Michiel Cowboy, RN  11/03/2016  Medical screening examination/treatment/procedure(s) were performed by non-physician practitioner and as supervising physician I was immediately available for consultation/collaboration. I agree with above. Scarlette Calico, MD

## 2016-11-03 NOTE — Telephone Encounter (Signed)
Order [id number]

## 2016-11-03 NOTE — Patient Instructions (Addendum)
Continue to eat heart healthy diet (full of fruits, vegetables, whole grains, lean protein, water--limit salt, fat, and sugar intake) and increase physical activity as tolerated.  Continue doing brain stimulating activities (puzzles, reading, adult coloring books, staying active) to keep memory sharp.   Ashley Cruz , Thank you for taking time to come for your Medicare Wellness Visit. I appreciate your ongoing commitment to your health goals. Please review the following plan we discussed and let me know if I can assist you in the future.   These are the goals we discussed: Goals    . Lose 10 pounds          Monitor diet and walk daily with my dog       This is a list of the screening recommended for you and due dates:  Health Maintenance  Topic Date Due  . Pneumonia vaccines (2 of 2 - PPSV23) 06/04/2014  . Mammogram  10/25/2016  . Flu Shot  02/16/2017  . Tetanus Vaccine  06/05/2023  . Colon Cancer Screening  07/14/2023  . DEXA scan (bone density measurement)  Completed  .  Hepatitis C: One time screening is recommended by Center for Disease Control  (CDC) for  adults born from [birth year] through 1964.   Completed

## 2016-11-04 ENCOUNTER — Encounter: Payer: Self-pay | Admitting: Internal Medicine

## 2016-11-08 DIAGNOSIS — Z1212 Encounter for screening for malignant neoplasm of rectum: Secondary | ICD-10-CM | POA: Diagnosis not present

## 2016-11-08 DIAGNOSIS — Z1211 Encounter for screening for malignant neoplasm of colon: Secondary | ICD-10-CM | POA: Diagnosis not present

## 2016-11-16 LAB — COLOGUARD: Cologuard: NEGATIVE

## 2016-11-19 ENCOUNTER — Ambulatory Visit: Payer: Medicare Other

## 2016-11-24 ENCOUNTER — Other Ambulatory Visit: Payer: Self-pay | Admitting: Internal Medicine

## 2016-11-24 DIAGNOSIS — E785 Hyperlipidemia, unspecified: Secondary | ICD-10-CM

## 2016-11-25 ENCOUNTER — Encounter: Payer: Self-pay | Admitting: Internal Medicine

## 2016-11-25 NOTE — Telephone Encounter (Signed)
Negative result abstracted.

## 2016-12-07 ENCOUNTER — Ambulatory Visit
Admission: RE | Admit: 2016-12-07 | Discharge: 2016-12-07 | Disposition: A | Discharge status: Home / Self Care | Payer: Medicare Other | Source: Ambulatory Visit | Attending: Internal Medicine | Admitting: Internal Medicine

## 2016-12-07 DIAGNOSIS — Z1239 Encounter for other screening for malignant neoplasm of breast: Secondary | ICD-10-CM

## 2016-12-07 DIAGNOSIS — Z1231 Encounter for screening mammogram for malignant neoplasm of breast: Secondary | ICD-10-CM | POA: Diagnosis not present

## 2016-12-08 LAB — HM MAMMOGRAPHY

## 2016-12-26 ENCOUNTER — Encounter: Payer: Self-pay | Admitting: Internal Medicine

## 2016-12-28 DIAGNOSIS — H35341 Macular cyst, hole, or pseudohole, right eye: Secondary | ICD-10-CM | POA: Diagnosis not present

## 2016-12-29 DIAGNOSIS — H43823 Vitreomacular adhesion, bilateral: Secondary | ICD-10-CM | POA: Diagnosis not present

## 2016-12-29 DIAGNOSIS — H2513 Age-related nuclear cataract, bilateral: Secondary | ICD-10-CM | POA: Diagnosis not present

## 2016-12-29 DIAGNOSIS — H35341 Macular cyst, hole, or pseudohole, right eye: Secondary | ICD-10-CM | POA: Diagnosis not present

## 2017-01-13 DIAGNOSIS — H35341 Macular cyst, hole, or pseudohole, right eye: Secondary | ICD-10-CM | POA: Diagnosis not present

## 2017-01-14 DIAGNOSIS — H35341 Macular cyst, hole, or pseudohole, right eye: Secondary | ICD-10-CM | POA: Diagnosis not present

## 2017-01-25 DIAGNOSIS — H35341 Macular cyst, hole, or pseudohole, right eye: Secondary | ICD-10-CM | POA: Diagnosis not present

## 2017-02-09 DIAGNOSIS — H35341 Macular cyst, hole, or pseudohole, right eye: Secondary | ICD-10-CM | POA: Diagnosis not present

## 2017-02-15 DIAGNOSIS — H35341 Macular cyst, hole, or pseudohole, right eye: Secondary | ICD-10-CM | POA: Diagnosis not present

## 2017-04-25 ENCOUNTER — Other Ambulatory Visit: Payer: Self-pay | Admitting: Internal Medicine

## 2017-04-25 DIAGNOSIS — E785 Hyperlipidemia, unspecified: Secondary | ICD-10-CM

## 2017-04-26 ENCOUNTER — Other Ambulatory Visit: Payer: Self-pay | Admitting: Internal Medicine

## 2017-04-26 DIAGNOSIS — H2511 Age-related nuclear cataract, right eye: Secondary | ICD-10-CM | POA: Diagnosis not present

## 2017-04-26 DIAGNOSIS — E785 Hyperlipidemia, unspecified: Secondary | ICD-10-CM

## 2017-04-26 DIAGNOSIS — H25013 Cortical age-related cataract, bilateral: Secondary | ICD-10-CM | POA: Diagnosis not present

## 2017-04-26 DIAGNOSIS — H25043 Posterior subcapsular polar age-related cataract, bilateral: Secondary | ICD-10-CM | POA: Diagnosis not present

## 2017-04-26 DIAGNOSIS — H25011 Cortical age-related cataract, right eye: Secondary | ICD-10-CM | POA: Diagnosis not present

## 2017-04-26 DIAGNOSIS — H2513 Age-related nuclear cataract, bilateral: Secondary | ICD-10-CM | POA: Diagnosis not present

## 2017-04-27 ENCOUNTER — Telehealth: Payer: Self-pay | Admitting: Internal Medicine

## 2017-04-27 DIAGNOSIS — E785 Hyperlipidemia, unspecified: Secondary | ICD-10-CM

## 2017-04-27 MED ORDER — ATORVASTATIN CALCIUM 40 MG PO TABS: 40.0000 mg | ORAL_TABLET | Freq: Every day | ORAL | 0 refills | Status: DC

## 2017-04-27 NOTE — Telephone Encounter (Signed)
Pt appt scheduled for 10/30, would like refill sent to   [facility] [place]

## 2017-04-30 ENCOUNTER — Ambulatory Visit: Payer: Medicare Other

## 2017-05-04 ENCOUNTER — Ambulatory Visit: Payer: Medicare Other

## 2017-05-13 ENCOUNTER — Other Ambulatory Visit: Payer: Self-pay | Admitting: Internal Medicine

## 2017-05-16 ENCOUNTER — Encounter: Payer: Self-pay | Admitting: Internal Medicine

## 2017-05-16 MED ORDER — CITALOPRAM HYDROBROMIDE 10 MG PO TABS: 10.0000 mg | ORAL_TABLET | Freq: Every day | ORAL | 0 refills | Status: DC

## 2017-05-17 ENCOUNTER — Ambulatory Visit (INDEPENDENT_AMBULATORY_CARE_PROVIDER_SITE_OTHER): Payer: Medicare Other | Admitting: Internal Medicine

## 2017-05-17 ENCOUNTER — Other Ambulatory Visit (INDEPENDENT_AMBULATORY_CARE_PROVIDER_SITE_OTHER): Payer: Medicare Other

## 2017-05-17 ENCOUNTER — Encounter: Payer: Self-pay | Admitting: Internal Medicine

## 2017-05-17 VITALS — BP 120/80 | HR 77 | Temp 98.4°F | Resp 16 | Ht 64.0 in | Wt 166.0 lb

## 2017-05-17 DIAGNOSIS — I6350 Cerebral infarction due to unspecified occlusion or stenosis of unspecified cerebral artery: Secondary | ICD-10-CM | POA: Diagnosis not present

## 2017-05-17 DIAGNOSIS — E785 Hyperlipidemia, unspecified: Secondary | ICD-10-CM

## 2017-05-17 DIAGNOSIS — F418 Other specified anxiety disorders: Secondary | ICD-10-CM | POA: Diagnosis not present

## 2017-05-17 DIAGNOSIS — Z0000 Encounter for general adult medical examination without abnormal findings: Secondary | ICD-10-CM

## 2017-05-17 DIAGNOSIS — Z23 Encounter for immunization: Secondary | ICD-10-CM | POA: Diagnosis not present

## 2017-05-17 LAB — THYROID PANEL WITH TSH
FREE THYROXINE INDEX: 1.9 (ref 1.4–3.8)
T3 UPTAKE: 28 % (ref 22–35)
T4 TOTAL: 6.7 ug/dL (ref 5.1–11.9)
TSH: 2.18 mIU/L (ref 0.40–4.50)

## 2017-05-17 LAB — COMPREHENSIVE METABOLIC PANEL
ALBUMIN: 4.5 g/dL (ref 3.5–5.2)
ALT: 28 U/L (ref 0–35)
AST: 22 U/L (ref 0–37)
Alkaline Phosphatase: 81 U/L (ref 39–117)
BILIRUBIN TOTAL: 0.5 mg/dL (ref 0.2–1.2)
BUN: 20 mg/dL (ref 6–23)
CALCIUM: 10.2 mg/dL (ref 8.4–10.5)
CO2: 26 mEq/L (ref 19–32)
Chloride: 105 mEq/L (ref 96–112)
Creatinine, Ser: 0.85 mg/dL (ref 0.40–1.20)
GFR: 70.25 mL/min (ref >60.00)
Glucose, Bld: 83 mg/dL (ref 70–99)
Potassium: 4 mEq/L (ref 3.5–5.1)
Sodium: 140 mEq/L (ref 135–145)
TOTAL PROTEIN: 7.4 g/dL (ref 6.0–8.3)

## 2017-05-17 LAB — LIPID PANEL
Cholesterol: 181 mg/dL (ref 0–200)
HDL: 58.3 mg/dL (ref >39.00)
LDL Cholesterol: 101 mg/dL — ABNORMAL HIGH (ref 0–99)
NonHDL: 122.81
TRIGLYCERIDES: 110 mg/dL (ref 0.0–149.0)
Total CHOL/HDL Ratio: 3
VLDL: 22 mg/dL (ref 0.0–40.0)

## 2017-05-17 MED ORDER — ESCITALOPRAM OXALATE 10 MG PO TABS: 10.0000 mg | ORAL_TABLET | Freq: Every day | ORAL | 1 refills | Status: DC

## 2017-05-17 MED ORDER — ASPIRIN EC 81 MG PO TBEC: 81.0000 mg | DELAYED_RELEASE_TABLET | Freq: Every day | ORAL | 3 refills | Status: DC

## 2017-05-17 NOTE — Progress Notes (Signed)
Subjective:  Patient ID: Ashley Cruz, female    DOB: 02/16/1946  Age: 71 y.o. MRN: [id number]  CC: Depression and Hyperlipidemia   HPI Ashley Cruz presents for f/up - she complains of persistent symptoms of excessive worry, anxiety, and anhedonia.  She does not think 10 mg of Celexa is helping very much.  She otherwise feels well and offers no other complaints.  Outpatient Medications Prior to Visit  Medication Sig Dispense Refill  . atorvastatin (LIPITOR) 40 MG tablet Take 1 tablet (40 mg total) by mouth daily at 6 PM. 90 tablet 0  . SUMAtriptan (IMITREX) 50 MG tablet Take 1 tablet (50 mg total) by mouth every 2 (two) hours as needed for migraine. 12 tablet 4  . citalopram (CELEXA) 10 MG tablet Take 1 tablet (10 mg total) by mouth daily. 30 tablet 0  . ibuprofen (ADVIL,MOTRIN) 200 MG tablet Take 200 mg by mouth every 6 (six) hours as needed.    . Ibuprofen-Diphenhydramine Cit (ADVIL PM PO) Take 1 tablet by mouth.     No facility-administered medications prior to visit.     ROS Review of Systems  Constitutional: Negative.  Negative for appetite change, diaphoresis, fatigue and unexpected weight change.  HENT: Negative.  Negative for trouble swallowing.   Eyes: Negative for visual disturbance.  Respiratory: Negative.  Negative for cough, chest tightness, shortness of breath and wheezing.   Cardiovascular: Negative.  Negative for chest pain, palpitations and leg swelling.  Gastrointestinal: Negative for abdominal pain, constipation, diarrhea, nausea and vomiting.  Endocrine: Negative.   Genitourinary: Negative.  Negative for difficulty urinating.  Musculoskeletal: Negative.  Negative for back pain and myalgias.  Skin: Negative.   Allergic/Immunologic: Negative.   Neurological: Negative.  Negative for dizziness and headaches.  Hematological: Negative.  Negative for adenopathy. Does not bruise/bleed easily.  Psychiatric/Behavioral: Positive for dysphoric mood. Negative for  behavioral problems, decreased concentration, hallucinations, self-injury, sleep disturbance and suicidal ideas. The patient is nervous/anxious. The patient is not hyperactive.     Objective:  BP 120/80 (BP Location: Left Arm, Patient Position: Sitting, Cuff Size: Normal)   Pulse 77   Temp 98.4 F (36.9 C) (Oral)   Resp 16   Ht 5\' 4"  (1.626 m)   Wt 166 lb (75.3 kg)   SpO2 97%   BMI 28.49 kg/m   BP Readings from Last 3 Encounters:  05/17/17 120/80  11/03/16 (!) 144/82  04/14/16 132/80    Wt Readings from Last 3 Encounters:  05/17/17 166 lb (75.3 kg)  11/03/16 169 lb (76.7 kg)  04/14/16 167 lb (75.8 kg)    Physical Exam  Constitutional: She is oriented to person, place, and time. No distress.  HENT:  Mouth/Throat: Oropharynx is clear and moist. No oropharyngeal exudate.  Eyes: Conjunctivae are normal. Right eye exhibits no discharge. Left eye exhibits no discharge. No scleral icterus.  Neck: Normal range of motion. Neck supple. No JVD present. No thyromegaly present.  Cardiovascular: Normal rate, regular rhythm and intact distal pulses.  Exam reveals no gallop and no friction rub.   No murmur heard. Pulmonary/Chest: Effort normal and breath sounds normal. No respiratory distress. She has no wheezes. She has no rales. She exhibits no tenderness.  Abdominal: Soft. Bowel sounds are normal. She exhibits no distension and no mass. There is no tenderness. There is no rebound and no guarding.  Musculoskeletal: Normal range of motion. She exhibits no edema, tenderness or deformity.  Lymphadenopathy:    She has no cervical adenopathy.  Neurological:  She is alert and oriented to person, place, and time.  Skin: Skin is warm and dry. No rash noted. She is not diaphoretic. No erythema. No pallor.  Psychiatric: Her behavior is normal. Judgment normal. Her mood appears anxious. Her speech is not rapid and/or pressured, not delayed and not tangential. She is not agitated, not slowed and not  withdrawn. Cognition and memory are normal. She does not exhibit a depressed mood. She expresses no homicidal and no suicidal ideation. She expresses no suicidal plans and no homicidal plans. She is attentive.  Vitals reviewed.   Lab Results  Component Value Date   WBC 8.2 04/14/2016   HGB 14.9 04/14/2016   HCT 44.1 04/14/2016   PLT 287.0 04/14/2016   GLUCOSE 83 05/17/2017   CHOL 181 05/17/2017   TRIG 110.0 05/17/2017   HDL 58.30 05/17/2017   LDLDIRECT 232.0 04/14/2016   LDLCALC 101 (H) 05/17/2017   ALT 28 05/17/2017   AST 22 05/17/2017   NA 140 05/17/2017   K 4.0 05/17/2017   CL 105 05/17/2017   CREATININE 0.85 05/17/2017   BUN 20 05/17/2017   CO2 26 05/17/2017   TSH 2.18 05/17/2017   INR 1.0 RATIO 04/04/2008   HGBA1C  11/26/2008    5.7 (NOTE) The ADA recommends the following therapeutic goal for glycemic control related to Hgb A1c measurement: Goal of therapy: <6.5 Hgb A1c  Reference: American Diabetes Association: Clinical Practice Recommendations 2010, Diabetes Care, 2010, 33: (Suppl  1).    Mm Screening Breast Tomo Bilateral  Result Date: 12/08/2016 CLINICAL DATA:  Screening. EXAM: 2D DIGITAL SCREENING BILATERAL MAMMOGRAM WITH CAD AND ADJUNCT TOMO COMPARISON:  Previous exam(s). ACR Breast Density Category a: The breast tissue is almost entirely fatty. FINDINGS: There are no findings suspicious for malignancy. Images were processed with CAD. IMPRESSION: No mammographic evidence of malignancy. A result letter of this screening mammogram will be mailed directly to the patient. RECOMMENDATION: Screening mammogram in one year. (Code:SM-B-01Y) BI-RADS CATEGORY  1: Negative. Electronically Signed   By: Evangeline Dakin M.D.   On: 12/08/2016 13:24    Assessment & Plan:   Ashley Cruz was seen today for depression and hyperlipidemia.  Diagnoses and all orders for this visit:  Need for influenza vaccination -     Flu vaccine HIGH DOSE PF (Fluzone High dose)  Need for pneumococcal  vaccination -     Pneumococcal polysaccharide vaccine 23-valent greater than or equal to 2yo subcutaneous/IM  Hyperlipidemia with target LDL less than 130- he has achieved her LDL goal is doing well on the statin. -     Lipid panel; Future -     Comprehensive metabolic panel; Future -     Thyroid Panel With TSH; Future  Anxiety with depression- I think she would have a better response to Lexapro.  Will start at 10 mg and increase the dose over time. -     escitalopram (LEXAPRO) 10 MG tablet; Take 1 tablet (10 mg total) by mouth daily.  Routine general medical examination at a health care facility  Cerebral artery occlusion with cerebral infarction [facility])- she had a minor CVA many years ago.  She has had no complications since then.  Will continue risk factor modification with statin and a baby aspirin a day. -     aspirin EC 81 MG tablet; Take 1 tablet (81 mg total) by mouth daily.   I have discontinued Ms. Happel's Ibuprofen-Diphenhydramine Cit (ADVIL PM PO), ibuprofen, and citalopram. I am also having her start  on escitalopram and aspirin EC. Additionally, I am having her maintain her SUMAtriptan and atorvastatin.  Meds ordered this encounter  Medications  . escitalopram (LEXAPRO) 10 MG tablet    Sig: Take 1 tablet (10 mg total) by mouth daily.    Dispense:  90 tablet    Refill:  1  . aspirin EC 81 MG tablet    Sig: Take 1 tablet (81 mg total) by mouth daily.    Dispense:  90 tablet    Refill:  3     Follow-up: Return in about 6 months (around 11/15/2017).  Scarlette Calico, MD

## 2017-05-17 NOTE — Patient Instructions (Signed)
 Dyslipidemia Dyslipidemia is an imbalance of waxy, fat-like substances (lipids) in the blood. The body needs lipids in small amounts. Dyslipidemia often involves a high level of cholesterol or triglycerides, which are types of lipids. Common forms of dyslipidemia include:  High levels of bad cholesterol (LDL cholesterol). LDL is the type of cholesterol that causes fatty deposits (plaques) to build up in the blood vessels that carry blood away from your heart (arteries).  Low levels of good cholesterol (HDL cholesterol). HDL cholesterol is the type of cholesterol that protects against heart disease. High levels of HDL remove the LDL buildup from arteries.  High levels of triglycerides. Triglycerides are a fatty substance in the blood that is linked to a buildup of plaques in the arteries.  You can develop dyslipidemia because of the genes you are born with (primary dyslipidemia) or changes that occur during your life (secondary dyslipidemia), or as a side effect of certain medical treatments. What are the causes? Primary dyslipidemia is caused by changes (mutations) in genes that are passed down through families (inherited). These mutations cause several types of dyslipidemia. Mutations can result in disorders that make the body produce too much LDL cholesterol or triglycerides, or not enough HDL cholesterol. These disorders may lead to heart disease, arterial disease, or stroke at an early age. Causes of secondary dyslipidemia include certain lifestyle choices and diseases that lead to dyslipidemia, such as:  Eating a diet that is high in animal fat.  Not getting enough activity or exercise (having a sedentary lifestyle).  Having diabetes, kidney disease, liver disease, or thyroid disease.  Drinking large amounts of alcohol.  Using certain types of drugs.  What increases the risk? You may be at greater risk for dyslipidemia if you are an older man or if you are a woman who has gone  through menopause. Other risk factors include:  Having a family history of dyslipidemia.  Taking certain medicines, including birth control pills, steroids, some diuretics, beta-blockers, and some medicines forHIV.  Smoking cigarettes.  Eating a high-fat diet.  Drinking large amounts of alcohol.  Having certain medical conditions such as diabetes, polycystic ovary syndrome (PCOS), pregnancy, kidney disease, liver disease, or hypothyroidism.  Not exercising regularly.  Being overweight or obese with too much belly fat.  What are the signs or symptoms? Dyslipidemia does not usually cause any symptoms. Very high lipid levels can cause fatty bumps under the skin (xanthomas) or a white or gray ring around the black center (pupil) of the eye. Very high triglyceride levels can cause inflammation of the pancreas (pancreatitis). How is this diagnosed? Your health care provider may diagnose dyslipidemia based on a routine blood test (fasting blood test). Because most people do not have symptoms of the condition, this blood testing (lipid profile) is done on adults age 20 and older and is repeated every 5 years. This test checks:  Total cholesterol. This is a measure of the total amount of cholesterol in your blood, including LDL cholesterol, HDL cholesterol, and triglycerides. A healthy number is below 200.  LDL cholesterol. The target number for LDL cholesterol is different for each person, depending on individual risk factors. For most people, a number below 100 is healthy. Ask your health care provider what your LDL cholesterol number should be.  HDL cholesterol. An HDL level of 60 or higher is best because it helps to protect against heart disease. A number below 40 for men or below 50 for women increases the risk for heart disease.  Triglycerides.   A healthy triglyceride number is below 150.  If your lipid profile is abnormal, your health care provider may do other blood tests to get more  information about your condition. How is this treated? Treatment depends on the type of dyslipidemia that you have and your other risk factors for heart disease and stroke. Your health care provider will have a target range for your lipid levels based on this information. For many people, treatment starts with lifestyle changes, such as diet and exercise. Your health care provider may recommend that you:  Get regular exercise.  Make changes to your diet.  Quit smoking if you smoke.  If diet changes and exercise do not help you reach your goals, your health care provider may also prescribe medicine to lower lipids. The most commonly prescribed type of medicine lowers your LDL cholesterol (statin drug). If you have a high triglyceride level, your provider may prescribe another type of drug (fibrate) or an omega-3 fish oil supplement, or both. Follow these instructions at home:  Take over-the-counter and prescription medicines only as told by your health care provider. This includes supplements.  Get regular exercise. Start an aerobic exercise and strength training program as told by your health care provider. Ask your health care provider what activities are safe for you. Your health care provider may recommend: ? 30 minutes of aerobic activity 4-6 days a week. Brisk walking is an example of aerobic activity. ? Strength training 2 days a week.  Eat a healthy diet as told by your health care provider. This can help you reach and maintain a healthy weight, lower your LDL cholesterol, and raise your HDL cholesterol. It may help to work with a diet and nutrition specialist (dietitian) to make a plan that is right for you. Your dietitian or health care provider may recommend: ? Limiting your calories, if you are overweight. ? Eating more fruits, vegetables, whole grains, fish, and lean meats. ? Limiting saturated fat, trans fat, and cholesterol.  Follow instructions from your health care provider  or dietitian about eating or drinking restrictions.  Limit alcohol intake to no more than one drink per day for nonpregnant women and two drinks per day for men. One drink equals 12 oz of beer, 5 oz of wine, or 1 oz of hard liquor.  Do not use any products that contain nicotine or tobacco, such as cigarettes and e-cigarettes. If you need help quitting, ask your health care provider.  Keep all follow-up visits as told by your health care provider. This is important. Contact a health care provider if:  You are having trouble sticking to your exercise or diet plan.  You are struggling to quit smoking or control your use of alcohol. Summary  Dyslipidemia is an imbalance of waxy, fat-like substances (lipids) in the blood. The body needs lipids in small amounts. Dyslipidemia often involves a high level of cholesterol or triglycerides, which are types of lipids.  Treatment depends on the type of dyslipidemia that you have and your other risk factors for heart disease and stroke.  For many people, treatment starts with lifestyle changes, such as diet and exercise. Your health care provider may also prescribe medicine to lower lipids. This information is not intended to replace advice given to you by your health care provider. Make sure you discuss any questions you have with your health care provider. Document Released: 07/10/2013 Document Revised: 03/01/2016 Document Reviewed: 03/01/2016 Elsevier Interactive Patient Education  2018 [employer] Inc[employer]  

## 2017-05-18 ENCOUNTER — Encounter: Payer: Self-pay | Admitting: Internal Medicine

## 2017-05-25 ENCOUNTER — Encounter: Payer: Self-pay | Admitting: Internal Medicine

## 2017-05-26 ENCOUNTER — Other Ambulatory Visit: Payer: Self-pay | Admitting: Internal Medicine

## 2017-05-26 DIAGNOSIS — H9190 Unspecified hearing loss, unspecified ear: Secondary | ICD-10-CM | POA: Insufficient documentation

## 2017-05-26 DIAGNOSIS — H9191 Unspecified hearing loss, right ear: Secondary | ICD-10-CM

## 2017-06-03 DIAGNOSIS — H2511 Age-related nuclear cataract, right eye: Secondary | ICD-10-CM | POA: Diagnosis not present

## 2017-06-04 ENCOUNTER — Encounter: Payer: Self-pay | Admitting: Internal Medicine

## 2017-06-07 ENCOUNTER — Encounter: Payer: Self-pay | Admitting: Internal Medicine

## 2017-06-21 ENCOUNTER — Encounter: Payer: Self-pay | Admitting: Internal Medicine

## 2017-08-20 ENCOUNTER — Other Ambulatory Visit: Payer: Self-pay | Admitting: Internal Medicine

## 2017-08-20 DIAGNOSIS — E785 Hyperlipidemia, unspecified: Secondary | ICD-10-CM

## 2017-11-03 NOTE — Progress Notes (Addendum)
Subjective:   Ashley Cruz is a 72 y.o. female who presents for Medicare Annual (Subsequent) preventive examination.  Review of Systems:  No ROS.  Medicare Wellness Visit. Additional risk factors are reflected in the social history.  Cardiac Risk Factors include: advanced age (>73men, >75 women);dyslipidemia;hypertension Sleep patterns: gets up 1-2 times nightly to void and sleeps 6 hours nightly. Patient reports insomnia issues, discussed recommended sleep tips and stress reduction tips.   Home Safety/Smoke Alarms: Feels safe in home. Smoke alarms in place.  Living environment; residence and Firearm Safety: 1-story house/ trailer, no firearms ,Lives with husband, no needs for DME, good support system. Seat Belt Safety/Bike Helmet: Wears seat belt.    Objective:     Vitals: BP (!) 142/64   Pulse 66   Resp 18   Ht 5\' 4"  (1.626 m)   Wt 170 lb (77.1 kg)   SpO2 99%   BMI 29.18 kg/m   Body mass index is 29.18 kg/m.  Advanced Directives 11/07/2017 11/03/2016  Does Patient Have a Medical Advance Directive? No Yes  Does patient want to make changes to medical advance directive? Yes (ED - Information included in AVS) Yes (ED - Information included in AVS)    Tobacco Social History   Tobacco Use  Smoking Status Never Smoker  Smokeless Tobacco Never Used     Counseling given: Not Answered  Past Medical History:  Diagnosis Date  . Allergy   . Anxiety   . Depression   . DJD (degenerative joint disease)   . DJD (degenerative joint disease)   . Early menopause   . 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 GERD (gastroesophageal reflux disease)   . History of pyelonephritis 2009  . Hyperlipidemia   . Migraine   . Osteoporosis    Past Surgical History:  Procedure Laterality Date  . ABDOMINAL HYSTERECTOMY     due to fibroids  . hx of breast biopsy  1990   neg  . OOPHORECTOMY  1977   due to endometriosis  . s/p Right  ectopic kidney   72 yrs old  . TONSILLECTOMY     Family History  Problem Relation Age of Onset  . Cancer Mother        Cancer  . Hyperlipidemia Mother   . Alcohol abuse Mother   . Breast cancer Mother   . Hyperlipidemia Sister   . Cancer Cousin        breast cancer  . Diabetes Other        uncle  . Alcohol abuse Other        uncle  . Breast cancer Maternal Aunt   . Heart disease Neg Hx   . Stroke Neg Hx   . Hypertension Neg Hx   . Kidney disease Neg Hx    Social History   Socioeconomic History  . Marital status: Married    Spouse name: Not on file  . Number of children: 3  . Years of education: Not on file  . Highest education level: Not on file  Occupational History  . Not on file  Social Needs  . Financial resource strain: Not hard at all  . Food insecurity:    Worry: Never true    Inability: Never true  . Transportation needs:    Medical: No    Non-medical: No  Tobacco Use  . Smoking status: Never Smoker  . Smokeless tobacco: Never Used  Substance  and Sexual Activity  . Alcohol use: Yes    Alcohol/week: 0.6 oz    Types: 1 Glasses of Ashley Cruz per week    Comment: Ashley Cruz once a month at most  . Drug use: No  . Sexual activity: Yes    Birth control/protection: Surgical  Lifestyle  . Physical activity:    Days per week: 0 days    Minutes per session: 0 min  . Stress: To some extent  Relationships  . Social connections:    Talks on phone: More than three times a week    Gets together: More than three times a week    Attends religious service: More than 4 times per year    Active member of club or organization: Yes    Attends meetings of clubs or organizations: More than 4 times per year    Relationship status: Married  Other Topics Concern  . Not on file  Social History Narrative  . Not on file    Outpatient Encounter Medications as of 11/07/2017  Medication Sig  . aspirin EC 81 MG tablet Take 1 tablet (81 mg total) by mouth daily.  Marland Kitchen atorvastatin  (LIPITOR) 40 MG tablet TAKE 1 TABLET(40 MG) BY MOUTH DAILY AT 6 PM  . escitalopram (LEXAPRO) 10 MG tablet Take 1 tablet (10 mg total) by mouth daily.  . SUMAtriptan (IMITREX) 50 MG tablet Take 1 tablet (50 mg total) by mouth every 2 (two) hours as needed for migraine.   No facility-administered encounter medications on file as of 11/07/2017.     Activities of Daily Living In your present state of health, do you have any difficulty performing the following activities: 11/07/2017  Hearing? N  Vision? N  Difficulty concentrating or making decisions? N  Walking or climbing stairs? N  Dressing or bathing? N  Doing errands, shopping? N  Preparing Food and eating ? N  Using the Toilet? N  In the past six months, have you accidently leaked urine? N  Do you have problems with loss of bowel control? N  Managing your Medications? N  Managing your Finances? N  Housekeeping or managing your Housekeeping? N  Some recent data might be hidden    Patient Care Team: Ashley Lima, MD as PCP - General    Assessment:   This is a routine wellness examination for Ashley Cruz. Physical assessment deferred to PCP.   Exercise Activities and Dietary recommendations Current Exercise Habits: The patient does not participate in regular exercise at present Diet (meal preparation, eat out, water intake, caffeinated beverages, dairy products, fruits and vegetables): in general, a "healthy" diet  , well balanced   Reviewed heart healthy diet, discussed weight loss strategies, encouraged patient to increase daily water intake. Diet education was provided via handout.  Goals    . Patient Stated     Lose weight by watching what I eat and increasing my physical activity.        Fall Risk Fall Risk  11/07/2017 11/03/2016  Falls in the past year? No No   Depression Screen PHQ 2/9 Scores 11/07/2017 11/03/2016 11/03/2016  PHQ - 2 Score 1 1 1   PHQ- 9 Score 3 4 -     Cognitive Function MMSE - Mini Mental State  Exam 11/07/2017  Orientation to time 5  Orientation to Place 5  Registration 3  Attention/ Calculation 5  Recall 2  Language- name 2 objects 2  Language- repeat 1  Language- follow 3 step command 3  Language- read &  follow direction 1  Write a sentence 1  Copy design 1  Total score 29        Immunization History  Administered Date(s) Administered  . Influenza, High Dose Seasonal PF 06/04/2013, 04/14/2016, 05/17/2017  . Pneumococcal Conjugate-13 06/04/2013  . Pneumococcal Polysaccharide-23 05/17/2017  . Tdap 06/04/2013   Screening Tests Health Maintenance  Topic Date Due  . INFLUENZA VACCINE  02/16/2018  . MAMMOGRAM  12/09/2018  . Fecal DNA (Cologuard)  11/17/2019  . TETANUS/TDAP  06/05/2023  . DEXA SCAN  Completed  . Hepatitis C Screening  Completed  . PNA vac Low Risk Adult  Completed      Plan:   Continue doing brain stimulating activities (puzzles, reading, adult coloring books, staying active) to keep memory sharp.   Continue to eat heart healthy diet (full of fruits, vegetables, whole grains, lean protein, water--limit salt, fat, and sugar intake) and increase physical activity as tolerated.    I have personally reviewed and noted the following in the patient's chart:   . Medical and social history . Use of alcohol, tobacco or illicit drugs  . Current medications and supplements . Functional ability and status . Nutritional status . Physical activity . Advanced directives . List of other physicians . Vitals . Screenings to include cognitive, depression, and falls . Referrals and appointments  In addition, I have reviewed and discussed with patient certain preventive protocols, quality metrics, and best practice recommendations. A written personalized care plan for preventive services as well as general preventive health recommendations were provided to patient.     Michiel Cowboy, RN  11/07/2017   Medical screening examination/treatment/procedure(s)  were performed by non-physician practitioner and as supervising physician I was immediately available for consultation/collaboration. I agree with above. Scarlette Calico, MD

## 2017-11-07 ENCOUNTER — Ambulatory Visit (INDEPENDENT_AMBULATORY_CARE_PROVIDER_SITE_OTHER): Payer: Medicare Other | Admitting: *Deleted

## 2017-11-07 VITALS — BP 142/64 | HR 66 | Resp 18 | Ht 64.0 in | Wt 170.0 lb

## 2017-11-07 DIAGNOSIS — Z0000 Encounter for general adult medical examination without abnormal findings: Secondary | ICD-10-CM

## 2017-11-07 NOTE — Patient Instructions (Addendum)
Continue doing brain stimulating activities (puzzles, reading, adult coloring books, staying active) to keep memory sharp.   Continue to eat heart healthy diet (full of fruits, vegetables, whole grains, lean protein, water--limit salt, fat, and sugar intake) and increase physical activity as tolerated.   Ashley Cruz , Thank you for taking time to come for your Medicare Wellness Visit. I appreciate your ongoing commitment to your health goals. Please review the following plan we discussed and let me know if I can assist you in the future.   These are the goals we discussed: Goals    . Patient Stated     Lose weight by watching what I eat and increasing my physical activity.        This is a list of the screening recommended for you and due dates:  Health Maintenance  Topic Date Due  . Flu Shot  02/16/2018  . Mammogram  12/09/2018  . Cologuard (Stool DNA test)  11/17/2019  . Tetanus Vaccine  06/05/2023  . DEXA scan (bone density measurement)  Completed  .  Hepatitis C: One time screening is recommended by Center for Disease Control  (CDC) for  adults born from [birth year] through 1964.   Completed  . Pneumonia vaccines  Completed   Health Maintenance, Female Adopting a healthy lifestyle and getting preventive care can go a long way to promote health and wellness. Talk with your health care provider about what schedule of regular examinations is right for you. This is a good chance for you to check in with your provider about disease prevention and staying healthy. In between checkups, there are plenty of things you can do on your own. Experts have done a lot of research about which lifestyle changes and preventive measures are most likely to keep you healthy. Ask your health care provider for more information. Weight and diet Eat a healthy diet  Be sure to include plenty of vegetables, fruits, low-fat dairy products, and lean protein.  Do not eat a lot of foods high in solid fats, added  sugars, or salt.  Get regular exercise. This is one of the most important things you can do for your health. ? Most adults should exercise for at least 150 minutes each week. The exercise should increase your heart rate and make you sweat (moderate-intensity exercise). ? Most adults should also do strengthening exercises at least twice a week. This is in addition to the moderate-intensity exercise.  Maintain a healthy weight  Body mass index (BMI) is a measurement that can be used to identify possible weight problems. It estimates body fat based on height and weight. Your health care provider can help determine your BMI and help you achieve or maintain a healthy weight.  For females 46 years of age and older: ? A BMI below 18.5 is considered underweight. ? A BMI of 18.5 to 24.9 is normal. ? A BMI of 25 to 29.9 is considered overweight. ? A BMI of 30 and above is considered obese.  Watch levels of cholesterol and blood lipids  You should start having your blood tested for lipids and cholesterol at 71 years of age, then have this test every 5 years.  You may need to have your cholesterol levels checked more often if: ? Your lipid or cholesterol levels are high. ? You are older than 71 years of age. ? You are at high risk for heart disease.  Cancer screening Lung Cancer  Lung cancer screening is recommended for adults 55-80 years  old who are at high risk for lung cancer because of a history of smoking.  A yearly low-dose CT scan of the lungs is recommended for people who: ? Currently smoke. ? Have quit within the past 15 years. ? Have at least a 30-pack-year history of smoking. A pack year is smoking an average of one pack of cigarettes a day for 1 year.  Yearly screening should continue until it has been 15 years since you quit.  Yearly screening should stop if you develop a health problem that would prevent you from having lung cancer treatment.  Breast Cancer  Practice breast  self-awareness. This means understanding how your breasts normally appear and feel.  It also means doing regular breast self-exams. Let your health care provider know about any changes, no matter how small.  If you are in your 20s or 30s, you should have a clinical breast exam (CBE) by a health care provider every 1-3 years as part of a regular health exam.  If you are 76 or older, have a CBE every year. Also consider having a breast X-ray (mammogram) every year.  If you have a family history of breast cancer, talk to your health care provider about genetic screening.  If you are at high risk for breast cancer, talk to your health care provider about having an MRI and a mammogram every year.  Breast cancer gene (BRCA) assessment is recommended for women who have family members with BRCA-related cancers. BRCA-related cancers include: ? Breast. ? Ovarian. ? Tubal. ? Peritoneal cancers.  Results of the assessment will determine the need for genetic counseling and BRCA1 and BRCA2 testing.  Cervical Cancer Your health care provider may recommend that you be screened regularly for cancer of the pelvic organs (ovaries, uterus, and vagina). This screening involves a pelvic examination, including checking for microscopic changes to the surface of your cervix (Pap test). You may be encouraged to have this screening done every 3 years, beginning at age 66.  For women ages 65-65, health care providers may recommend pelvic exams and Pap testing every 3 years, or they may recommend the Pap and pelvic exam, combined with testing for human papilloma virus (HPV), every 5 years. Some types of HPV increase your risk of cervical cancer. Testing for HPV may also be done on women of any age with unclear Pap test results.  Other health care providers may not recommend any screening for nonpregnant women who are considered low risk for pelvic cancer and who do not have symptoms. Ask your health care provider if a  screening pelvic exam is right for you.  If you have had past treatment for cervical cancer or a condition that could lead to cancer, you need Pap tests and screening for cancer for at least 20 years after your treatment. If Pap tests have been discontinued, your risk factors (such as having a new sexual partner) need to be reassessed to determine if screening should resume. Some women have medical problems that increase the chance of getting cervical cancer. In these cases, your health care provider may recommend more frequent screening and Pap tests.  Colorectal Cancer  This type of cancer can be detected and often prevented.  Routine colorectal cancer screening usually begins at 71 years of age and continues through 71 years of age.  Your health care provider may recommend screening at an earlier age if you have risk factors for colon cancer.  Your health care provider may also recommend using home test  kits to check for hidden blood in the stool.  A small camera at the end of a tube can be used to examine your colon directly (sigmoidoscopy or colonoscopy). This is done to check for the earliest forms of colorectal cancer.  Routine screening usually begins at age 6.  Direct examination of the colon should be repeated every 5-10 years through 71 years of age. However, you may need to be screened more often if early forms of precancerous polyps or small growths are found.  Skin Cancer  Check your skin from head to toe regularly.  Tell your health care provider about any new moles or changes in moles, especially if there is a change in a mole's shape or color.  Also tell your health care provider if you have a mole that is larger than the size of a pencil eraser.  Always use sunscreen. Apply sunscreen liberally and repeatedly throughout the day.  Protect yourself by wearing long sleeves, pants, a wide-brimmed hat, and sunglasses whenever you are outside.  Heart disease, diabetes, and  high blood pressure  High blood pressure causes heart disease and increases the risk of stroke. High blood pressure is more likely to develop in: ? People who have blood pressure in the high end of the normal range (130-139/85-89 mm Hg). ? People who are overweight or obese. ? People who are African American.  If you are 86-17 years of age, have your blood pressure checked every 3-5 years. If you are 6 years of age or older, have your blood pressure checked every year. You should have your blood pressure measured twice-once when you are at a hospital or clinic, and once when you are not at a hospital or clinic. Record the average of the two measurements. To check your blood pressure when you are not at a hospital or clinic, you can use: ? An automated blood pressure machine at a pharmacy. ? A home blood pressure monitor.  If you are between 37 years and 9 years old, ask your health care provider if you should take aspirin to prevent strokes.  Have regular diabetes screenings. This involves taking a blood sample to check your fasting blood sugar level. ? If you are at a normal weight and have a low risk for diabetes, have this test once every three years after 71 years of age. ? If you are overweight and have a high risk for diabetes, consider being tested at a younger age or more often. Preventing infection Hepatitis B  If you have a higher risk for hepatitis B, you should be screened for this virus. You are considered at high risk for hepatitis B if: ? You were born in a country where hepatitis B is common. Ask your health care provider which countries are considered high risk. ? Your parents were born in a high-risk country, and you have not been immunized against hepatitis B (hepatitis B vaccine). ? You have HIV or AIDS. ? You use needles to inject street drugs. ? You live with someone who has hepatitis B. ? You have had sex with someone who has hepatitis B. ? You get hemodialysis  treatment. ? You take certain medicines for conditions, including cancer, organ transplantation, and autoimmune conditions.  Hepatitis C  Blood testing is recommended for: ? Everyone born from [birth year] through 1964. ? Anyone with known risk factors for hepatitis C.  Sexually transmitted infections (STIs)  You should be screened for sexually transmitted infections (STIs) including gonorrhea and chlamydia if: ?  You are sexually active and are younger than 71 years of age. ? You are older than 71 years of age and your health care provider tells you that you are at risk for this type of infection. ? Your sexual activity has changed since you were last screened and you are at an increased risk for chlamydia or gonorrhea. Ask your health care provider if you are at risk.  If you do not have HIV, but are at risk, it may be recommended that you take a prescription medicine daily to prevent HIV infection. This is called pre-exposure prophylaxis (PrEP). You are considered at risk if: ? You are sexually active and do not regularly use condoms or know the HIV status of your partner(s). ? You take drugs by injection. ? You are sexually active with a partner who has HIV.  Talk with your health care provider about whether you are at high risk of being infected with HIV. If you choose to begin PrEP, you should first be tested for HIV. You should then be tested every 3 months for as long as you are taking PrEP. Pregnancy  If you are premenopausal and you may become pregnant, ask your health care provider about preconception counseling.  If you may become pregnant, take 400 to 800 micrograms (mcg) of folic acid every day.  If you want to prevent pregnancy, talk to your health care provider about birth control (contraception). Osteoporosis and menopause  Osteoporosis is a disease in which the bones lose minerals and strength with aging. This can result in serious bone fractures. Your risk for osteoporosis  can be identified using a bone density scan.  If you are 68 years of age or older, or if you are at risk for osteoporosis and fractures, ask your health care provider if you should be screened.  Ask your health care provider whether you should take a calcium or vitamin D supplement to lower your risk for osteoporosis.  Menopause may have certain physical symptoms and risks.  Hormone replacement therapy may reduce some of these symptoms and risks. Talk to your health care provider about whether hormone replacement therapy is right for you. Follow these instructions at home:  Schedule regular health, dental, and eye exams.  Stay current with your immunizations.  Do not use any tobacco products including cigarettes, chewing tobacco, or electronic cigarettes.  If you are pregnant, do not drink alcohol.  If you are breastfeeding, limit how much and how often you drink alcohol.  Limit alcohol intake to no more than 1 drink per day for nonpregnant women. One drink equals 12 ounces of beer, 5 ounces of wine, or 1 ounces of hard liquor.  Do not use street drugs.  Do not share needles.  Ask your health care provider for help if you need support or information about quitting drugs.  Tell your health care provider if you often feel depressed.  Tell your health care provider if you have ever been abused or do not feel safe at home. This information is not intended to replace advice given to you by your health care provider. Make sure you discuss any questions you have with your health care provider. Document Released: 01/18/2011 Document Revised: 12/11/2015 Document Reviewed: 04/08/2015 Elsevier Interactive Patient Education  [employer].

## 2017-11-23 ENCOUNTER — Other Ambulatory Visit: Payer: Self-pay | Admitting: Internal Medicine

## 2017-11-23 DIAGNOSIS — F418 Other specified anxiety disorders: Secondary | ICD-10-CM

## 2017-12-05 ENCOUNTER — Encounter: Payer: Self-pay | Admitting: Internal Medicine

## 2017-12-05 ENCOUNTER — Ambulatory Visit (INDEPENDENT_AMBULATORY_CARE_PROVIDER_SITE_OTHER): Payer: Medicare Other | Admitting: Internal Medicine

## 2017-12-05 VITALS — BP 130/80 | HR 67 | Temp 98.5°F | Resp 16 | Ht 64.0 in | Wt 169.0 lb

## 2017-12-05 DIAGNOSIS — E6609 Other obesity due to excess calories: Secondary | ICD-10-CM

## 2017-12-05 DIAGNOSIS — E66811 Obesity, class 1: Secondary | ICD-10-CM | POA: Insufficient documentation

## 2017-12-05 DIAGNOSIS — I10 Essential (primary) hypertension: Secondary | ICD-10-CM | POA: Diagnosis not present

## 2017-12-05 MED ORDER — LIRAGLUTIDE -WEIGHT MANAGEMENT 18 MG/3ML ~~LOC~~ SOPN: 3.0000 mg | PEN_INJECTOR | Freq: Every day | SUBCUTANEOUS | 5 refills | Status: DC

## 2017-12-05 NOTE — Patient Instructions (Signed)
Obesity, Adult  Obesity is the condition of having too much total body fat. Being overweight or obese means that your weight is greater than what is considered healthy for your body size. Obesity is determined by a measurement called BMI. BMI is an estimate of body fat and is calculated from height and weight. For adults, a BMI of 30 or higher is considered obese.  Obesity can eventually lead to other health concerns and major illnesses, including:  · Stroke.  · Coronary artery disease (CAD).  · Type 2 diabetes.  · Some types of cancer, including cancers of the colon, breast, uterus, and gallbladder.  · Osteoarthritis.  · High blood pressure (hypertension).  · High cholesterol.  · Sleep apnea.  · Gallbladder stones.  · Infertility problems.    What are the causes?  The main cause of obesity is taking in (consuming) more calories than your body uses for energy. Other factors that contribute to this condition may include:  · Being born with genes that make you more likely to become obese.  · Having a medical condition that causes obesity. These conditions include:  ? Hypothyroidism.  ? Polycystic ovarian syndrome (PCOS).  ? Binge-eating disorder.  ? Cushing syndrome.  · Taking certain medicines, such as steroids, antidepressants, and seizure medicines.  · Not being physically active (sedentary lifestyle).  · Living where there are limited places to exercise safely or buy healthy foods.  · Not getting enough sleep.    What increases the risk?  The following factors may increase your risk of this condition:  · Having a family history of obesity.  · Being a woman of African-American descent.  · Being a man of Hispanic descent.    What are the signs or symptoms?  Having excessive body fat is the main symptom of this condition.  How is this diagnosed?  This condition may be diagnosed based on:  · Your symptoms.  · Your medical history.  · A physical exam. Your health care provider may measure:  ? Your BMI. If you are an  adult with a BMI between 25 and less than 30, you are considered overweight. If you are an adult with a BMI of 30 or higher, you are considered obese.  ? The distances around your hips and your waist (circumferences). These may be compared to each other to help diagnose your condition.  ? Your skinfold thickness. Your health care provider may gently pinch a fold of your skin and measure it.    How is this treated?  Treatment for this condition often includes changing your lifestyle. Treatment may include some or all of the following:  · Dietary changes. Work with your health care provider and a dietitian to set a weight-loss goal that is healthy and reasonable for you. Dietary changes may include eating:  ? Smaller portions. A portion size is the amount of a particular food that is healthy for you to eat at one time. This varies from person to person.  ? Low-calorie or low-fat options.  ? More whole grains, fruits, and vegetables.  · Regular physical activity. This may include aerobic activity (cardio) and strength training.  · Medicine to help you lose weight. Your health care provider may prescribe medicine if you are unable to lose 1 pound a week after 6 weeks of eating more healthily and doing more physical activity.  · Surgery. Surgical options may include gastric banding and gastric bypass. Surgery may be done if:  ? Other   treatments have not helped to improve your condition.  ? You have a BMI of 40 or higher.  ? You have life-threatening health problems related to obesity.    Follow these instructions at home:    Eating and drinking    · Follow recommendations from your health care provider about what you eat and drink. Your health care provider may advise you to:  ? Limit fast foods, sweets, and processed snack foods.  ? Choose low-fat options, such as low-fat milk instead of whole milk.  ? Eat 5 or more servings of fruits or vegetables every day.  ? Eat at home more often. This gives you more control over  what you eat.  ? Choose healthy foods when you eat out.  ? Learn what a healthy portion size is.  ? Keep low-fat snacks on hand.  ? Avoid sugary drinks, such as soda, fruit juice, iced tea sweetened with sugar, and flavored milk.  ? Eat a healthy breakfast.  · Drink enough water to keep your urine clear or pale yellow.  · Do not go without eating for long periods of time (do not fast) or follow a fad diet. Fasting and fad diets can be unhealthy and even dangerous.  Physical Activity  · Exercise regularly, as told by your health care provider. Ask your health care provider what types of exercise are safe for you and how often you should exercise.  · Warm up and stretch before being active.  · Cool down and stretch after being active.  · Rest between periods of activity.  Lifestyle  · Limit the time that you spend in front of your TV, computer, or video game system.  · Find ways to reward yourself that do not involve food.  · Limit alcohol intake to no more than 1 drink a day for nonpregnant women and 2 drinks a day for men. One drink equals 12 oz of beer, 5 oz of wine, or 1½ oz of hard liquor.  General instructions  · Keep a weight loss journal to keep track of the food you eat and how much you exercise you get.  · Take over-the-counter and prescription medicines only as told by your health care provider.  · Take vitamins and supplements only as told by your health care provider.  · Consider joining a support group. Your health care provider may be able to recommend a support group.  · Keep all follow-up visits as told by your health care provider. This is important.  Contact a health care provider if:  · You are unable to meet your weight loss goal after 6 weeks of dietary and lifestyle changes.  This information is not intended to replace advice given to you by your health care provider. Make sure you discuss any questions you have with your health care provider.  Document Released: 08/12/2004 Document Revised:  12/08/2015 Document Reviewed: 04/23/2015  Elsevier Interactive Patient Education © 2018 [employer]

## 2017-12-05 NOTE — Progress Notes (Signed)
Subjective:  Patient ID: Ashley Cruz, female    DOB: Mar 09, 1946  Age: 72 y.o. MRN: [id number]  CC: Hypertension   HPI Ashley Cruz presents for f/up - she complains of weight gain and wants to try a supplement to help her lose weight.  She complains of chronic chest pain, shortness of breath, and DOE.  This has been thoroughly evaluated by cardiology and, in fact, she has undergone a cardiac catheterization years ago which was normal.  Other than gaining weight her symptoms have not recently worsened or change.  She complains that she is a "stress eater."  Outpatient Medications Prior to Visit  Medication Sig Dispense Refill  . atorvastatin (LIPITOR) 40 MG tablet TAKE 1 TABLET(40 MG) BY MOUTH DAILY AT 6 PM 90 tablet 1  . escitalopram (LEXAPRO) 10 MG tablet TAKE 1 TABLET(10 MG) BY MOUTH DAILY 90 tablet 0  . SUMAtriptan (IMITREX) 50 MG tablet Take 1 tablet (50 mg total) by mouth every 2 (two) hours as needed for migraine. 12 tablet 4  . aspirin EC 81 MG tablet Take 1 tablet (81 mg total) by mouth daily. 90 tablet 3   No facility-administered medications prior to visit.     ROS Review of Systems  Constitutional: Positive for unexpected weight change. Negative for chills, diaphoresis and fatigue.  HENT: Negative.   Eyes: Negative.   Respiratory: Positive for shortness of breath. Negative for cough, chest tightness and wheezing.   Cardiovascular: Positive for chest pain. Negative for palpitations and leg swelling.  Gastrointestinal: Negative.  Negative for abdominal pain, diarrhea, nausea and vomiting.  Endocrine: Negative.   Genitourinary: Negative.  Negative for difficulty urinating.  Musculoskeletal: Negative.  Negative for arthralgias, myalgias and neck pain.  Skin: Negative.  Negative for color change and pallor.  Allergic/Immunologic: Negative.   Neurological: Negative.  Negative for dizziness and light-headedness.  Hematological: Negative for adenopathy. Does not  bruise/bleed easily.  Psychiatric/Behavioral: Negative for confusion, decreased concentration, dysphoric mood, sleep disturbance and suicidal ideas. The patient is nervous/anxious.     Objective:  BP 130/80 (BP Location: Left Arm, Patient Position: Sitting, Cuff Size: Normal)   Pulse 67   Temp 98.5 F (36.9 C) (Oral)   Resp 16   Ht 5\' 4"  (1.626 m)   Wt 169 lb (76.7 kg)   SpO2 97%   BMI 29.01 kg/m   BP Readings from Last 3 Encounters:  12/05/17 130/80  11/07/17 (!) 142/64  05/17/17 120/80    Wt Readings from Last 3 Encounters:  12/05/17 169 lb (76.7 kg)  11/07/17 170 lb (77.1 kg)  05/17/17 166 lb (75.3 kg)    Physical Exam  Constitutional: She is oriented to person, place, and time. No distress.  HENT:  Mouth/Throat: Oropharynx is clear and moist. No oropharyngeal exudate.  Eyes: Conjunctivae are normal. No scleral icterus.  Neck: Normal range of motion. Neck supple. No JVD present. No thyromegaly present.  Cardiovascular: Normal rate, regular rhythm and normal heart sounds. Exam reveals no gallop and no friction rub.  No murmur heard. Pulmonary/Chest: Effort normal and breath sounds normal. No stridor. No respiratory distress. She has no wheezes. She has no rales.  Abdominal: Soft. Bowel sounds are normal. She exhibits no mass. There is no tenderness.  Musculoskeletal: Normal range of motion. She exhibits no edema, tenderness or deformity.  Neurological: She is alert and oriented to person, place, and time.  Skin: Skin is warm and dry. She is not diaphoretic.  Psychiatric: She has a normal  mood and affect. Her behavior is normal. Judgment and thought content normal.  Vitals reviewed.   Lab Results  Component Value Date   WBC 8.2 04/14/2016   HGB 14.9 04/14/2016   HCT 44.1 04/14/2016   PLT 287.0 04/14/2016   GLUCOSE 83 05/17/2017   CHOL 181 05/17/2017   TRIG 110.0 05/17/2017   HDL 58.30 05/17/2017   LDLDIRECT 232.0 04/14/2016   LDLCALC 101 (H) 05/17/2017   ALT  28 05/17/2017   AST 22 05/17/2017   NA 140 05/17/2017   K 4.0 05/17/2017   CL 105 05/17/2017   CREATININE 0.85 05/17/2017   BUN 20 05/17/2017   CO2 26 05/17/2017   TSH 2.18 05/17/2017   INR 1.0 RATIO 04/04/2008   HGBA1C  11/26/2008    5.7 (NOTE) The ADA recommends the following therapeutic goal for glycemic control related to Hgb A1c measurement: Goal of therapy: <6.5 Hgb A1c  Reference: American Diabetes Association: Clinical Practice Recommendations 2010, Diabetes Care, 2010, 33: (Suppl  1).    Mm Screening Breast Tomo Bilateral  Result Date: 12/08/2016 CLINICAL DATA:  Screening. EXAM: 2D DIGITAL SCREENING BILATERAL MAMMOGRAM WITH CAD AND ADJUNCT TOMO COMPARISON:  Previous exam(s). ACR Breast Density Category a: The breast tissue is almost entirely fatty. FINDINGS: There are no findings suspicious for malignancy. Images were processed with CAD. IMPRESSION: No mammographic evidence of malignancy. A result letter of this screening mammogram will be mailed directly to the patient. RECOMMENDATION: Screening mammogram in one year. (Code:SM-B-01Y) BI-RADS CATEGORY  1: Negative. Electronically Signed   By: Evangeline Dakin M.D.   On: 12/08/2016 13:24    Assessment & Plan:   Ashley Cruz was seen today for hypertension.  Diagnoses and all orders for this visit:  Class 1 obesity due to excess calories with serious comorbidity in adult, unspecified BMI- in addition to lifestyle modifications I have asked her to start using a GLP-1 agonist to decrease her caloric intake and to help her lose weight. -     Liraglutide -Weight Management (SAXENDA) 18 MG/3ML SOPN; Inject 3 mg into the skin daily.  Essential hypertension- Her blood pressure is adequately well controlled.  She will continue to work on her lifestyle modifications.   I have discontinued Ashley Cruz's aspirin EC. I am also having her start on Liraglutide -Weight Management. Additionally, I am having her maintain her SUMAtriptan,  atorvastatin, and escitalopram.  Meds ordered this encounter  Medications  . Liraglutide -Weight Management (SAXENDA) 18 MG/3ML SOPN    Sig: Inject 3 mg into the skin daily.    Dispense:  5 pen    Refill:  5     Follow-up: Return in about 3 months (around 03/07/2018).  Scarlette Calico, MD

## 2017-12-13 ENCOUNTER — Telehealth: Payer: Self-pay

## 2017-12-13 NOTE — Telephone Encounter (Signed)
Key: [id number]

## 2017-12-15 NOTE — Telephone Encounter (Signed)
PA has been denied.  

## 2017-12-20 ENCOUNTER — Encounter: Payer: Self-pay | Admitting: Internal Medicine

## 2017-12-22 DIAGNOSIS — H25012 Cortical age-related cataract, left eye: Secondary | ICD-10-CM | POA: Diagnosis not present

## 2017-12-22 DIAGNOSIS — H2512 Age-related nuclear cataract, left eye: Secondary | ICD-10-CM | POA: Diagnosis not present

## 2017-12-22 DIAGNOSIS — Z961 Presence of intraocular lens: Secondary | ICD-10-CM | POA: Diagnosis not present

## 2018-02-24 ENCOUNTER — Other Ambulatory Visit: Payer: Self-pay | Admitting: Internal Medicine

## 2018-02-24 DIAGNOSIS — F418 Other specified anxiety disorders: Secondary | ICD-10-CM

## 2018-04-17 ENCOUNTER — Other Ambulatory Visit: Payer: Self-pay

## 2018-04-17 NOTE — Patient Outreach (Signed)
[facility]) Care Management  04/17/2018  Ashley Cruz [birth year] [id number]   Medication Adherence call to Mrs. [name] left a message for patient to call back patient is due on Atorvastatin 40 mg. Mrs. [name] is showing past due under [place].  [facility] Direct Dial [phone]  Fax [phone] [name]

## 2018-05-10 ENCOUNTER — Other Ambulatory Visit: Payer: Self-pay | Admitting: Internal Medicine

## 2018-05-10 DIAGNOSIS — E785 Hyperlipidemia, unspecified: Secondary | ICD-10-CM

## 2018-08-28 ENCOUNTER — Other Ambulatory Visit: Payer: Self-pay | Admitting: Internal Medicine

## 2018-08-28 DIAGNOSIS — F418 Other specified anxiety disorders: Secondary | ICD-10-CM

## 2018-09-11 ENCOUNTER — Other Ambulatory Visit: Payer: Self-pay | Admitting: Internal Medicine

## 2018-09-11 DIAGNOSIS — E785 Hyperlipidemia, unspecified: Secondary | ICD-10-CM

## 2018-10-17 ENCOUNTER — Telehealth: Payer: Self-pay | Admitting: *Deleted

## 2018-10-17 NOTE — Telephone Encounter (Signed)
Called patient and LVM to inform them the nurse needs to either convert their upcoming AWV to a virtual visit or reschedule the visit out into the future due to covid-19 safety measures. Nurse requested that the patient call-back and stated she would call them back at a later date.    

## 2018-11-09 ENCOUNTER — Other Ambulatory Visit: Payer: Self-pay | Admitting: Internal Medicine

## 2018-11-09 ENCOUNTER — Telehealth: Payer: Self-pay | Admitting: *Deleted

## 2018-11-09 ENCOUNTER — Ambulatory Visit (INDEPENDENT_AMBULATORY_CARE_PROVIDER_SITE_OTHER): Payer: Medicare Other | Admitting: *Deleted

## 2018-11-09 DIAGNOSIS — Z0000 Encounter for general adult medical examination without abnormal findings: Secondary | ICD-10-CM | POA: Diagnosis not present

## 2018-11-09 DIAGNOSIS — G43409 Hemiplegic migraine, not intractable, without status migrainosus: Secondary | ICD-10-CM

## 2018-11-09 MED ORDER — SUMATRIPTAN SUCCINATE 50 MG PO TABS: 50.0000 mg | ORAL_TABLET | ORAL | 4 refills | Status: DC | PRN

## 2018-11-09 NOTE — Progress Notes (Addendum)
Subjective:   Ashley Cruz is a 73 y.o. female who presents for Medicare Annual (Subsequent) preventive examination.  I connected with patient 11/09/18 at  1:00 PM EDT by a video enabled telemedicine application and verified that I am speaking with the correct person using two identifiers. Patient stated full name and DOB. Patient gave permission to continue with virtual visit. Patient's location was at home and Nurse's location was at [place] office.   Review of Systems:  No ROS.  Medicare Wellness Visit. Additional risk factors are reflected in the social history.  Cardiac Risk Factors include: advanced age (>53men, >80 women);dyslipidemia Sleep patterns: feels rested on waking, gets up 0-1 times nightly to void and sleeps 7 hours nightly.    Home Safety/Smoke Alarms: Feels safe in home. Smoke alarms in place.  Living environment; residence and Firearm Safety: 1-story house/ trailer. Lives with husband, no needs for DME, good support syste Seat Belt Safety/Bike Helmet: Wears seat belt.      Objective:     Vitals: There were no vitals taken for this visit.  There is no height or weight on file to calculate BMI.  Advanced Directives 11/09/2018 11/07/2017 11/03/2016  Does Patient Have a Medical Advance Directive? No No Yes  Does patient want to make changes to medical advance directive? - Yes (ED - Information included in AVS) Yes (ED - Information included in AVS)  Would patient like information on creating a medical advance directive? Yes (ED - Information included in AVS) - -    Tobacco Social History   Tobacco Use  Smoking Status Never Smoker  Smokeless Tobacco Never Used     Counseling given: Not Answered  Past Medical History:  Diagnosis Date  . Allergy   . Anxiety   . Depression   . DJD (degenerative joint disease)   . DJD (degenerative joint disease)   . Early menopause   . 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 GERD (gastroesophageal reflux disease)   . History of pyelonephritis 2009  . Hyperlipidemia   . Migraine   . Osteoporosis    Past Surgical History:  Procedure Laterality Date  . ABDOMINAL HYSTERECTOMY     due to fibroids  . hx of breast biopsy  1990   neg  . OOPHORECTOMY  1977   due to endometriosis  . s/p Right ectopic kidney   73 yrs old  . TONSILLECTOMY     Family History  Problem Relation Age of Onset  . Cancer Mother        Cancer  . Hyperlipidemia Mother   . Alcohol abuse Mother   . Breast cancer Mother   . Hyperlipidemia Sister   . Cancer Cousin        breast cancer  . Diabetes Other        uncle  . Alcohol abuse Other        uncle  . Breast cancer Maternal Aunt   . Heart disease Neg Hx   . Stroke Neg Hx   . Hypertension Neg Hx   . Kidney disease Neg Hx    Social History   Socioeconomic History  . Marital status: Married    Spouse name: Not on file  . Number of children: 3  . Years of education: Not on file  . Highest education level: Not on file  Occupational History  . Not on file  Social Needs  . Emergency planning/management officer  strain: Not hard at all  . Food insecurity:    Worry: Never true    Inability: Never true  . Transportation needs:    Medical: No    Non-medical: No  Tobacco Use  . Smoking status: Never Smoker  . Smokeless tobacco: Never Used  Substance and Sexual Activity  . Alcohol use: Yes    Alcohol/week: 1.0 standard drinks    Types: 1 Glasses of  per week    Comment:  once a month at most  . Drug use: No  . Sexual activity: Yes    Birth control/protection: Surgical  Lifestyle  . Physical activity:    Days per week: 0 days    Minutes per session: 0 min  . Stress: Not at all  Relationships  . Social connections:    Talks on phone: More than three times a week    Gets together: More than three times a week    Attends religious service: More than 4 times per year    Active member of club or organization: Yes    Attends  meetings of clubs or organizations: More than 4 times per year    Relationship status: Married  Other Topics Concern  . Not on file  Social History Narrative  . Not on file    Outpatient Encounter Medications as of 11/09/2018  Medication Sig  . atorvastatin (LIPITOR) 40 MG tablet TAKE 1 TABLET(40 MG) BY MOUTH DAILY AT 6 PM  . escitalopram (LEXAPRO) 10 MG tablet TAKE 1 TABLET(10 MG) BY MOUTH DAILY  . Liraglutide -Weight Management (SAXENDA) 18 MG/3ML SOPN Inject 3 mg into the skin daily.  . SUMAtriptan (IMITREX) 50 MG tablet Take 1 tablet (50 mg total) by mouth every 2 (two) hours as needed for migraine.   No facility-administered encounter medications on file as of 11/09/2018.     Activities of Daily Living In your present state of health, do you have any difficulty performing the following activities: 11/09/2018  Hearing? N  Vision? N  Difficulty concentrating or making decisions? N  Walking or climbing stairs? N  Dressing or bathing? N  Doing errands, shopping? N  Preparing Food and eating ? N  Using the Toilet? N  In the past six months, have you accidently leaked urine? Y  Comment occassionally if I sneeze, run or jump.  Do you have problems with loss of bowel control? N  Managing your Medications? N  Managing your Finances? N  Housekeeping or managing your Housekeeping? N  Some recent data might be hidden    Patient Care Team: Janith Lima, MD as PCP - General    Assessment:   This is a routine wellness examination for Ashley Cruz. Physical assessment deferred to PCP.  Exercise Activities and Dietary recommendations Current Exercise Habits: The patient does not participate in regular exercise at present(reports plan to start walking routinely with her husband), Exercise limited by: orthopedic condition(s)  Diet (meal preparation, eat out, water intake, caffeinated beverages, dairy products, fruits and vegetables): in general, a "healthy" diet       Reviewed heart  healthy diet. Discussed diet for weight loss. Encouraged patient to maintain daily water and healthy fluid intake.  Goals    . Lose 10 pounds     Monitor diet and walk daily with my dog    . Patient Stated     Lose weight by watching what I eat and increasing my physical activity.        Fall Risk Fall  Risk  11/09/2018 11/07/2017 11/03/2016  Falls in the past year? 0 No No   Depression Screen PHQ 2/9 Scores 11/09/2018 11/07/2017 11/03/2016 11/03/2016  PHQ - 2 Score 0 1 1 1   PHQ- 9 Score - 3 4 -     Cognitive Function MMSE - Mini Mental State Exam 11/07/2017  Orientation to time 5  Orientation to Place 5  Registration 3  Attention/ Calculation 5  Recall 2  Language- name 2 objects 2  Language- repeat 1  Language- follow 3 step command 3  Language- read & follow direction 1  Write a sentence 1  Copy design 1  Total score 29       Ad8 score reviewed for issues:  Issues making decisions: no  Less interest in hobbies / activities: no  Repeats questions, stories (family complaining): no  Trouble using ordinary gadgets (microwave, computer, phone):no  Forgets the month or year: no  Mismanaging finances: no  Remembering appts: no  Daily problems with thinking and/or memory: no Ad8 score is= 0  Immunization History  Administered Date(s) Administered  . Influenza, High Dose Seasonal PF 06/04/2013, 04/14/2016, 05/17/2017  . Pneumococcal Conjugate-13 06/04/2013  . Pneumococcal Polysaccharide-23 05/17/2017  . Tdap 06/04/2013   Screening Tests Health Maintenance  Topic Date Due  . MAMMOGRAM  12/09/2018  . INFLUENZA VACCINE  02/17/2019  . Fecal DNA (Cologuard)  11/17/2019  . TETANUS/TDAP  06/05/2023  . DEXA SCAN  Completed  . Hepatitis C Screening  Completed  . PNA vac Low Risk Adult  Completed      Plan:    Reviewed health maintenance screenings with patient today and relevant education, vaccines, and/or referrals were provided.   Continue to eat heart  healthy diet (full of fruits, vegetables, whole grains, lean protein, water--limit salt, fat, and sugar intake) and increase physical activity as tolerated.  Continue doing brain stimulating activities (puzzles, reading, adult coloring books, staying active) to keep memory sharp.   I have personally reviewed and noted the following in the patient's chart:   . Medical and social history . Use of alcohol, tobacco or illicit drugs  . Current medications and supplements . Functional ability and status . Nutritional status . Physical activity . Advanced directives . List of other physicians . Screenings to include cognitive, depression, and falls . Referrals and appointments  In addition, I have reviewed and discussed with patient certain preventive protocols, quality metrics, and best practice recommendations. A written personalized care plan for preventive services as well as general preventive health recommendations were provided to patient.   Medical screening examination/treatment/procedure(s) were performed by non-physician practitioner and as supervising physician I was immediately available for consultation/collaboration. I agree with above. Scarlette Calico, MD   Michiel Cowboy, RN  11/09/2018

## 2018-11-09 NOTE — Patient Instructions (Signed)
Continue doing brain stimulating activities (puzzles, reading, adult coloring books, staying active) to keep memory sharp.   Continue to eat heart healthy diet (full of fruits, vegetables, whole grains, lean protein, water--limit salt, fat, and sugar intake) and increase physical activity as tolerated.   Ashley Cruz , Thank you for taking time to come for your Medicare Wellness Visit. I appreciate your ongoing commitment to your health goals. Please review the following plan we discussed and let me know if I can assist you in the future.   These are the goals we discussed: Goals    . Lose 10 pounds     Monitor diet and walk daily with my dog    . Patient Stated     Lose weight by watching what I eat and increasing my physical activity.     . Patient Stated     I want to lose weight by starting to walk on my property with my husband routinely.       This is a list of the screening recommended for you and due dates:  Health Maintenance  Topic Date Due  . Mammogram  12/09/2018  . Flu Shot  02/17/2019  . Cologuard (Stool DNA test)  11/17/2019  . Tetanus Vaccine  06/05/2023  . DEXA scan (bone density measurement)  Completed  .  Hepatitis C: One time screening is recommended by Center for Disease Control  (CDC) for  adults born from [birth year] through 1964.   Completed  . Pneumonia vaccines  Completed    Preventive Care 72 Years and Older, Female Preventive care refers to lifestyle choices and visits with your health care provider that can promote health and wellness. What does preventive care include?  A yearly physical exam. This is also called an annual well check.  Dental exams once or twice a year.  Routine eye exams. Ask your health care provider how often you should have your eyes checked.  Personal lifestyle choices, including: ? Daily care of your teeth and gums. ? Regular physical activity. ? Eating a healthy diet. ? Avoiding tobacco and drug use. ? Limiting alcohol use.  ? Practicing safe sex. ? Taking low-dose aspirin every day. ? Taking vitamin and mineral supplements as recommended by your health care provider. What happens during an annual well check? The services and screenings done by your health care provider during your annual well check will depend on your age, overall health, lifestyle risk factors, and family history of disease. Counseling Your health care provider may ask you questions about your:  Alcohol use.  Tobacco use.  Drug use.  Emotional well-being.  Home and relationship well-being.  Sexual activity.  Eating habits.  History of falls.  Memory and ability to understand (cognition).  Work and work Statistician.  Reproductive health.  Screening You may have the following tests or measurements:  Height, weight, and BMI.  Blood pressure.  Lipid and cholesterol levels. These may be checked every 5 years, or more frequently if you are over 42 years old.  Skin check.  Lung cancer screening. You may have this screening every year starting at age 82 if you have a 30-pack-year history of smoking and currently smoke or have quit within the past 15 years.  Colorectal cancer screening. All adults should have this screening starting at age 50 and continuing until age 2. You will have tests every 1-10 years, depending on your results and the type of screening test. People at increased risk should start screening at an  earlier age. Screening tests may include: ? Guaiac-based fecal occult blood testing. ? Fecal immunochemical test (FIT). ? Stool DNA test. ? Virtual colonoscopy. ? Sigmoidoscopy. During this test, a flexible tube with a tiny camera (sigmoidoscope) is used to examine your rectum and lower colon. The sigmoidoscope is inserted through your anus into your rectum and lower colon. ? Colonoscopy. During this test, a long, thin, flexible tube with a tiny camera (colonoscope) is used to examine your entire colon and rectum.   Hepatitis C blood test.  Hepatitis B blood test.  Sexually transmitted disease (STD) testing.  Diabetes screening. This is done by checking your blood sugar (glucose) after you have not eaten for a while (fasting). You may have this done every 1-3 years.  Bone density scan. This is done to screen for osteoporosis. You may have this done starting at age 15.  Mammogram. This may be done every 1-2 years. Talk to your health care provider about how often you should have regular mammograms. Talk with your health care provider about your test results, treatment options, and if necessary, the need for more tests. Vaccines Your health care provider may recommend certain vaccines, such as:  Influenza vaccine. This is recommended every year.  Tetanus, diphtheria, and acellular pertussis (Tdap, Td) vaccine. You may need a Td booster every 10 years.  Varicella vaccine. You may need this if you have not been vaccinated.  Zoster vaccine. You may need this after age 56.  Measles, mumps, and rubella (MMR) vaccine. You may need at least one dose of MMR if you were born in 1957 or later. You may also need a second dose.  Pneumococcal 13-valent conjugate (PCV13) vaccine. One dose is recommended after age 65.  Pneumococcal polysaccharide (PPSV23) vaccine. One dose is recommended after age 33.  Meningococcal vaccine. You may need this if you have certain conditions.  Hepatitis A vaccine. You may need this if you have certain conditions or if you travel or work in places where you may be exposed to hepatitis A.  Hepatitis B vaccine. You may need this if you have certain conditions or if you travel or work in places where you may be exposed to hepatitis B.  Haemophilus influenzae type b (Hib) vaccine. You may need this if you have certain conditions. Talk to your health care provider about which screenings and vaccines you need and how often you need them. This information is not intended to replace  advice given to you by your health care provider. Make sure you discuss any questions you have with your health care provider. Document Released: 08/01/2015 Document Revised: 08/25/2017 Document Reviewed: 05/06/2015 Elsevier Interactive Patient Education  2019 [employer].

## 2018-11-09 NOTE — Telephone Encounter (Signed)
During AWV, patient stated that she needed a prescription refill for Imitrex. She also states that the [place] worked really well for her weight loss but she cannot afford that medication. Patient wants to know if PCP could prescribe something else more economical to help her lose weight.

## 2018-11-13 ENCOUNTER — Ambulatory Visit: Payer: Medicare Other

## 2018-12-29 ENCOUNTER — Telehealth: Payer: Self-pay | Admitting: Internal Medicine

## 2018-12-29 NOTE — Telephone Encounter (Signed)
Routing to dr jones, please advise, thanks 

## 2018-12-29 NOTE — Telephone Encounter (Signed)
Copied from [place] [phone]. Topic: Quick Communication - Rx Refill/Question >> Dec 29, 2018 12:12 PM Colvin Caroli N wrote: Medication: atorvastatin (LIPITOR) 40 MG tablet   Patient is requesting refill.   Preferred Pharmacy (with phone number or street name):[facility] [place], [place] AT [place] [phone] (Phone) [phone] (Fax)

## 2019-01-01 NOTE — Telephone Encounter (Signed)
She needs to be seen  TJ 

## 2019-01-02 NOTE — Telephone Encounter (Signed)
Left message for patient to call back to schedule. Okay to work in this week per [employer].

## 2019-01-02 NOTE — Telephone Encounter (Signed)
Routing to scheduling., can you please schedule patient, thanks

## 2019-01-02 NOTE — Telephone Encounter (Signed)
Patient returning call to [facility]. States that she will be driving tomorrow and unable to write anything down. States if she does not answer, please leave a detailed message.

## 2019-01-03 NOTE — Telephone Encounter (Signed)
Left message for patient to call back to schedule an appointment with Dr Ronnald Ramp for medication refills.

## 2019-01-05 ENCOUNTER — Other Ambulatory Visit: Payer: Self-pay | Admitting: Internal Medicine

## 2019-01-05 ENCOUNTER — Encounter: Payer: Self-pay | Admitting: Internal Medicine

## 2019-01-05 DIAGNOSIS — E785 Hyperlipidemia, unspecified: Secondary | ICD-10-CM

## 2019-01-05 MED ORDER — ATORVASTATIN CALCIUM 40 MG PO TABS: 40.0000 mg | ORAL_TABLET | Freq: Every day | ORAL | 0 refills | Status: DC

## 2019-01-05 NOTE — Telephone Encounter (Signed)
Patient is scheduled for 01/16/19 at 3:30pm for CPE.

## 2019-01-16 ENCOUNTER — Encounter: Payer: Self-pay | Admitting: Internal Medicine

## 2019-01-16 ENCOUNTER — Other Ambulatory Visit (INDEPENDENT_AMBULATORY_CARE_PROVIDER_SITE_OTHER): Payer: Medicare Other

## 2019-01-16 ENCOUNTER — Ambulatory Visit (INDEPENDENT_AMBULATORY_CARE_PROVIDER_SITE_OTHER): Payer: Medicare Other | Admitting: Internal Medicine

## 2019-01-16 ENCOUNTER — Other Ambulatory Visit: Payer: Self-pay

## 2019-01-16 VITALS — BP 162/88 | HR 68 | Temp 97.8°F | Ht 64.0 in | Wt 171.2 lb

## 2019-01-16 DIAGNOSIS — E785 Hyperlipidemia, unspecified: Secondary | ICD-10-CM | POA: Diagnosis not present

## 2019-01-16 DIAGNOSIS — Z1231 Encounter for screening mammogram for malignant neoplasm of breast: Secondary | ICD-10-CM | POA: Insufficient documentation

## 2019-01-16 DIAGNOSIS — I6350 Cerebral infarction due to unspecified occlusion or stenosis of unspecified cerebral artery: Secondary | ICD-10-CM | POA: Diagnosis not present

## 2019-01-16 DIAGNOSIS — I10 Essential (primary) hypertension: Secondary | ICD-10-CM

## 2019-01-16 DIAGNOSIS — Z0000 Encounter for general adult medical examination without abnormal findings: Secondary | ICD-10-CM | POA: Diagnosis not present

## 2019-01-16 DIAGNOSIS — R0600 Dyspnea, unspecified: Secondary | ICD-10-CM | POA: Insufficient documentation

## 2019-01-16 DIAGNOSIS — R0609 Other forms of dyspnea: Secondary | ICD-10-CM | POA: Insufficient documentation

## 2019-01-16 DIAGNOSIS — N183 Chronic kidney disease, stage 3 unspecified: Secondary | ICD-10-CM

## 2019-01-16 LAB — CBC WITH DIFFERENTIAL/PLATELET
Basophils Absolute: 0.1 10*3/uL (ref 0.0–0.1)
Basophils Relative: 0.9 % (ref 0.0–3.0)
Eosinophils Absolute: 0.3 10*3/uL (ref 0.0–0.7)
Eosinophils Relative: 3.1 % (ref 0.0–5.0)
HCT: 42.2 % (ref 36.0–46.0)
Hemoglobin: 13.7 g/dL (ref 12.0–15.0)
Lymphocytes Relative: 42.1 % (ref 12.0–46.0)
Lymphs Abs: 3.4 10*3/uL (ref 0.7–4.0)
MCHC: 32.6 g/dL (ref 30.0–36.0)
MCV: 95 fl (ref 78.0–100.0)
Monocytes Absolute: 0.8 10*3/uL (ref 0.1–1.0)
Monocytes Relative: 9.7 % (ref 3.0–12.0)
Neutro Abs: 3.6 10*3/uL (ref 1.4–7.7)
Neutrophils Relative %: 44.2 % (ref 43.0–77.0)
Platelets: 282 10*3/uL (ref 150.0–400.0)
RBC: 4.44 Mil/uL (ref 3.87–5.11)
RDW: 14.6 % (ref 11.5–15.5)
WBC: 8.2 10*3/uL (ref 4.0–10.5)

## 2019-01-16 LAB — TSH: TSH: 2.69 u[IU]/mL (ref 0.35–4.50)

## 2019-01-16 LAB — URINALYSIS, ROUTINE W REFLEX MICROSCOPIC
Bilirubin Urine: NEGATIVE
Ketones, ur: NEGATIVE
Nitrite: NEGATIVE
Specific Gravity, Urine: >=1.03 — AB (ref 1.000–1.030)
Total Protein, Urine: NEGATIVE
Urine Glucose: NEGATIVE
Urobilinogen, UA: 0.2 (ref 0.0–1.0)
pH: 5.5 (ref 5.0–8.0)

## 2019-01-16 LAB — VITAMIN D 25 HYDROXY (VIT D DEFICIENCY, FRACTURES): VITD: 34.87 ng/mL (ref 30.00–100.00)

## 2019-01-16 MED ORDER — EDARBI 40 MG PO TABS: 1.0000 | ORAL_TABLET | Freq: Every day | ORAL | 1 refills | Status: DC

## 2019-01-16 NOTE — Patient Instructions (Signed)

## 2019-01-16 NOTE — Progress Notes (Signed)
Subjective:  Patient ID: Ashley Cruz, female    DOB: 1945-11-05  Age: 73 y.o. MRN: [id number]  CC: Annual Exam, Hypertension, and Hyperlipidemia   HPI Ashley Cruz presents for a CPX.  She complains that over the last year she has developed DOE and weight gain.  She denies chest pain, near syncope, dizziness, lightheadedness, headache, fatigue, or edema.  Outpatient Medications Prior to Visit  Medication Sig Dispense Refill  . atorvastatin (LIPITOR) 40 MG tablet Take 1 tablet (40 mg total) by mouth daily at 6 PM. 30 tablet 0  . escitalopram (LEXAPRO) 10 MG tablet TAKE 1 TABLET(10 MG) BY MOUTH DAILY 90 tablet 1  . SUMAtriptan (IMITREX) 50 MG tablet Take 1 tablet (50 mg total) by mouth every 2 (two) hours as needed for migraine. 12 tablet 4   No facility-administered medications prior to visit.     ROS Review of Systems  Constitutional: Positive for unexpected weight change. Negative for appetite change, diaphoresis and fatigue.  HENT: Negative.   Eyes: Negative for visual disturbance.  Respiratory: Positive for shortness of breath. Negative for cough, chest tightness and wheezing.   Cardiovascular: Negative for chest pain, palpitations and leg swelling.  Gastrointestinal: Negative for abdominal pain, constipation, diarrhea, nausea and vomiting.  Endocrine: Negative.   Genitourinary: Negative.  Negative for difficulty urinating, dysuria and frequency.  Musculoskeletal: Negative.  Negative for arthralgias and myalgias.  Skin: Negative.  Negative for color change and rash.  Neurological: Negative for dizziness, weakness, numbness and headaches.  Hematological: Negative for adenopathy.  Psychiatric/Behavioral: Negative.     Objective:  BP (!) 162/88 (BP Location: Left Arm, Patient Position: Sitting, Cuff Size: Normal)   Pulse 68   Temp 97.8 F (36.6 C) (Oral)   Ht 5\' 4"  (1.626 m)   Wt 171 lb 4 oz (77.7 kg)   SpO2 96%   BMI 29.39 kg/m   BP Readings from Last 3  Encounters:  01/16/19 (!) 162/88  12/05/17 130/80  11/07/17 (!) 142/64    Wt Readings from Last 3 Encounters:  01/16/19 171 lb 4 oz (77.7 kg)  12/05/17 169 lb (76.7 kg)  11/07/17 170 lb (77.1 kg)    Physical Exam Vitals signs reviewed.  Constitutional:      Appearance: She is not ill-appearing or diaphoretic.  HENT:     Nose: Nose normal.     Mouth/Throat:     Mouth: Mucous membranes are moist.  Eyes:     General: No scleral icterus.    Conjunctiva/sclera: Conjunctivae normal.  Neck:     Musculoskeletal: Normal range of motion. No neck rigidity or muscular tenderness.  Cardiovascular:     Rate and Rhythm: Normal rate and regular rhythm.     Heart sounds: No murmur. No gallop.   Pulmonary:     Breath sounds: No stridor. No wheezing, rhonchi or rales.  Chest:     Comments: EKG ---  Sinus  Rhythm  WITHIN NORMAL LIMITS Abdominal:     General: Abdomen is protuberant. Bowel sounds are normal. There is no distension.     Palpations: There is no hepatomegaly or splenomegaly.     Tenderness: There is no abdominal tenderness.     Hernia: No hernia is present.  Genitourinary:    Comments: She deferred on her breast, GU, and rectal exams.  She tells me these are done by her GYN. Musculoskeletal: Normal range of motion.     Right lower leg: No edema.     Left lower  leg: No edema.  Lymphadenopathy:     Cervical: No cervical adenopathy.  Skin:    General: Skin is warm and dry.     Coloration: Skin is not pale.     Findings: No erythema or rash.  Neurological:     General: No focal deficit present.     Mental Status: She is oriented to person, place, and time.  Psychiatric:        Mood and Affect: Mood normal.        Behavior: Behavior normal.     Lab Results  Component Value Date   WBC 8.2 01/16/2019   HGB 13.7 01/16/2019   HCT 42.2 01/16/2019   PLT 282.0 01/16/2019   GLUCOSE 77 01/16/2019   CHOL 182 01/16/2019   TRIG 154.0 (H) 01/16/2019   HDL 62.70 01/16/2019    LDLDIRECT 232.0 04/14/2016   LDLCALC 89 01/16/2019   ALT 25 01/16/2019   AST 20 01/16/2019   NA 141 01/16/2019   K 4.7 01/16/2019   CL 105 01/16/2019   CREATININE 0.95 01/16/2019   BUN 19 01/16/2019   CO2 25 01/16/2019   TSH 2.69 01/16/2019   INR 1.0 RATIO 04/04/2008   HGBA1C  11/26/2008    5.7 (NOTE) The ADA recommends the following therapeutic goal for glycemic control related to Hgb A1c measurement: Goal of therapy: <6.5 Hgb A1c  Reference: American Diabetes Association: Clinical Practice Recommendations 2010, Diabetes Care, 2010, 33: (Suppl  1).    Mm Screening Breast Tomo Bilateral  Result Date: 12/08/2016 CLINICAL DATA:  Screening. EXAM: 2D DIGITAL SCREENING BILATERAL MAMMOGRAM WITH CAD AND ADJUNCT TOMO COMPARISON:  Previous exam(s). ACR Breast Density Category a: The breast tissue is almost entirely fatty. FINDINGS: There are no findings suspicious for malignancy. Images were processed with CAD. IMPRESSION: No mammographic evidence of malignancy. A result letter of this screening mammogram will be mailed directly to the patient. RECOMMENDATION: Screening mammogram in one year. (Code:SM-B-01Y) BI-RADS CATEGORY  1: Negative. Electronically Signed   By: Evangeline Dakin M.D.   On: 12/08/2016 13:24    Assessment & Plan:   Ashley Cruz was seen today for annual exam, hypertension and hyperlipidemia.  Diagnoses and all orders for this visit:  Cerebral artery occlusion with cerebral infarction [facility])- She has had no recurrent cerebrovascular events.  I will continue to work on risk factor modifications. -     Lipid panel; Future -     Azilsartan Medoxomil (EDARBI) 40 MG TABS; Take 1 tablet by mouth daily.  Essential hypertension- Her blood pressure is not adequately well controlled and she has developed mild renal insufficiency.  Her labs are otherwise negative for secondary causes or endorgan damage.  I have asked her to start taking an ARB to treat this. -     CBC with  Differential/Platelet; Future -     Comprehensive metabolic panel; Future -     TSH; Future -     Urinalysis, Routine w reflex microscopic; Future -     VITAMIN D 25 Hydroxy (Vit-D Deficiency, Fractures); Future -     EKG 12-Lead -     Azilsartan Medoxomil (EDARBI) 40 MG TABS; Take 1 tablet by mouth daily.  Hyperlipidemia with target LDL less than 130- She has achieved her LDL goal is doing well on the statin. -     Lipid panel; Future -     TSH; Future  Visit for screening mammogram -     MM DIGITAL SCREENING BILATERAL; Future  DOE (dyspnea on  exertion)- Her EKG is negative for LVH or ischemia.  I have asked her to undergo an exercise treadmill test to screen for coronary artery disease. -     EXERCISE TOLERANCE TEST (ETT); Future  Chronic renal disease, stage 3, moderately decreased glomerular filtration rate (GFR) between 30-59 mL/min/1.73 square meter ([place])- She agrees to avoid nephrotoxic agents.  I will try to gain better control of her blood pressure.   I am having Ashley Cruz start on Edarbi. I am also having her maintain her escitalopram, SUMAtriptan, and atorvastatin.  Meds ordered this encounter  Medications  . Azilsartan Medoxomil (EDARBI) 40 MG TABS    Sig: Take 1 tablet by mouth daily.    Dispense:  90 tablet    Refill:  1     Follow-up: Return in about 3 months (around 04/18/2019).  Scarlette Calico, MD

## 2019-01-17 ENCOUNTER — Encounter: Payer: Self-pay | Admitting: Internal Medicine

## 2019-01-17 DIAGNOSIS — N1831 Chronic kidney disease, stage 3a: Secondary | ICD-10-CM | POA: Insufficient documentation

## 2019-01-17 DIAGNOSIS — N183 Chronic kidney disease, stage 3 unspecified: Secondary | ICD-10-CM | POA: Insufficient documentation

## 2019-01-17 LAB — LIPID PANEL
Cholesterol: 182 mg/dL (ref 0–200)
HDL: 62.7 mg/dL (ref >39.00)
LDL Cholesterol: 89 mg/dL (ref 0–99)
NonHDL: 119.34
Total CHOL/HDL Ratio: 3
Triglycerides: 154 mg/dL — ABNORMAL HIGH (ref 0.0–149.0)
VLDL: 30.8 mg/dL (ref 0.0–40.0)

## 2019-01-17 LAB — COMPREHENSIVE METABOLIC PANEL
ALT: 25 U/L (ref 0–35)
AST: 20 U/L (ref 0–37)
Albumin: 4.5 g/dL (ref 3.5–5.2)
Alkaline Phosphatase: 75 U/L (ref 39–117)
BUN: 19 mg/dL (ref 6–23)
CO2: 25 mEq/L (ref 19–32)
Calcium: 10.1 mg/dL (ref 8.4–10.5)
Chloride: 105 mEq/L (ref 96–112)
Creatinine, Ser: 0.95 mg/dL (ref 0.40–1.20)
GFR: 57.86 mL/min — ABNORMAL LOW (ref >60.00)
Glucose, Bld: 77 mg/dL (ref 70–99)
Potassium: 4.7 mEq/L (ref 3.5–5.1)
Sodium: 141 mEq/L (ref 135–145)
Total Bilirubin: 0.3 mg/dL (ref 0.2–1.2)
Total Protein: 7.4 g/dL (ref 6.0–8.3)

## 2019-01-18 NOTE — Assessment & Plan Note (Signed)
Exam completed. Labs reviewed. She is referred for screening mammogram. Pap and colon cancer screening are up-to-date. Vaccines were reviewed.

## 2019-01-25 ENCOUNTER — Encounter: Payer: Self-pay | Admitting: Internal Medicine

## 2019-02-02 ENCOUNTER — Telehealth (HOSPITAL_COMMUNITY): Payer: Self-pay

## 2019-02-02 NOTE — Telephone Encounter (Signed)
Encounter complete. 

## 2019-02-05 ENCOUNTER — Encounter: Payer: Self-pay | Admitting: Internal Medicine

## 2019-02-05 ENCOUNTER — Other Ambulatory Visit (HOSPITAL_COMMUNITY)
Admission: RE | Admit: 2019-02-05 | Discharge: 2019-02-05 | Disposition: A | Discharge status: Home / Self Care | Payer: Medicare Other | Source: Ambulatory Visit | Attending: Family Medicine | Admitting: Family Medicine

## 2019-02-05 DIAGNOSIS — Z1159 Encounter for screening for other viral diseases: Secondary | ICD-10-CM | POA: Insufficient documentation

## 2019-02-05 LAB — SARS CORONAVIRUS 2 (TAT 6-24 HRS): SARS Coronavirus 2: NEGATIVE

## 2019-02-06 ENCOUNTER — Encounter: Payer: Self-pay | Admitting: Internal Medicine

## 2019-02-06 NOTE — Telephone Encounter (Signed)
Patient has made an appointment for tomorrow 

## 2019-02-07 ENCOUNTER — Other Ambulatory Visit: Payer: Self-pay

## 2019-02-07 ENCOUNTER — Encounter: Payer: Self-pay | Admitting: Internal Medicine

## 2019-02-07 ENCOUNTER — Ambulatory Visit (HOSPITAL_COMMUNITY)
Admission: RE | Admit: 2019-02-07 | Discharge: 2019-02-07 | Disposition: A | Discharge status: Home / Self Care | Payer: Medicare Other | Source: Ambulatory Visit | Attending: Cardiovascular Disease | Admitting: Cardiovascular Disease

## 2019-02-07 ENCOUNTER — Ambulatory Visit (INDEPENDENT_AMBULATORY_CARE_PROVIDER_SITE_OTHER): Payer: Medicare Other | Admitting: Internal Medicine

## 2019-02-07 VITALS — BP 156/88 | HR 68 | Temp 98.1°F | Resp 16 | Ht 64.0 in | Wt 172.0 lb

## 2019-02-07 DIAGNOSIS — R0600 Dyspnea, unspecified: Secondary | ICD-10-CM

## 2019-02-07 DIAGNOSIS — E785 Hyperlipidemia, unspecified: Secondary | ICD-10-CM | POA: Diagnosis not present

## 2019-02-07 DIAGNOSIS — L237 Allergic contact dermatitis due to plants, except food: Secondary | ICD-10-CM | POA: Diagnosis not present

## 2019-02-07 DIAGNOSIS — N183 Chronic kidney disease, stage 3 unspecified: Secondary | ICD-10-CM

## 2019-02-07 DIAGNOSIS — R009 Unspecified abnormalities of heart beat: Secondary | ICD-10-CM | POA: Diagnosis not present

## 2019-02-07 DIAGNOSIS — R0609 Other forms of dyspnea: Secondary | ICD-10-CM | POA: Diagnosis not present

## 2019-02-07 DIAGNOSIS — I10 Essential (primary) hypertension: Secondary | ICD-10-CM | POA: Diagnosis not present

## 2019-02-07 DIAGNOSIS — L2084 Intrinsic (allergic) eczema: Secondary | ICD-10-CM | POA: Insufficient documentation

## 2019-02-07 LAB — EXERCISE TOLERANCE TEST
Estimated workload: 7 METS
Exercise duration (min): 5 min
Exercise duration (sec): 0 s
MPHR: 149 {beats}/min
Peak HR: 133 {beats}/min
Percent HR: 89 %
RPE: 18
Rest HR: 66 {beats}/min

## 2019-02-07 MED ORDER — ATORVASTATIN CALCIUM 40 MG PO TABS: 40.0000 mg | ORAL_TABLET | Freq: Every day | ORAL | 1 refills | Status: DC

## 2019-02-07 MED ORDER — INDAPAMIDE 1.25 MG PO TABS: 1.2500 mg | ORAL_TABLET | Freq: Every day | ORAL | 1 refills | Status: DC

## 2019-02-07 MED ORDER — CLOBETASOL PROPIONATE 0.05 % EX OINT: 1.0000 "application " | TOPICAL_OINTMENT | Freq: Two times a day (BID) | CUTANEOUS | 1 refills | Status: DC

## 2019-02-07 MED ORDER — IRBESARTAN 150 MG PO TABS: 150.0000 mg | ORAL_TABLET | Freq: Every day | ORAL | 1 refills | Status: DC

## 2019-02-07 MED ORDER — LEVOCETIRIZINE DIHYDROCHLORIDE 5 MG PO TABS: 5.0000 mg | ORAL_TABLET | Freq: Every evening | ORAL | 0 refills | Status: DC

## 2019-02-07 NOTE — Patient Instructions (Signed)

## 2019-02-07 NOTE — Progress Notes (Signed)
Subjective:  Patient ID: Ashley Cruz, female    DOB: 20-Jul-1945  Age: 73 y.o. MRN: [id number]  CC: Hypertension and Rash   HPI Ashley Cruz presents for f/up - She did an exercise tolerance test earlier today and was found to have a hypertensive response to exercise.  There was no evidence of ischemia.  She is not taking the ARB because she says the pharmacy never sent it to her.  She complains of a 2-week history of rash on her right foot and her left calf.  She has been treating it with Neosporin without imrpovement.  The rash is pruritic.  Outpatient Medications Prior to Visit  Medication Sig Dispense Refill  . escitalopram (LEXAPRO) 10 MG tablet TAKE 1 TABLET(10 MG) BY MOUTH DAILY 90 tablet 1  . SUMAtriptan (IMITREX) 50 MG tablet Take 1 tablet (50 mg total) by mouth every 2 (two) hours as needed for migraine. 12 tablet 4  . atorvastatin (LIPITOR) 40 MG tablet Take 1 tablet (40 mg total) by mouth daily at 6 PM. 30 tablet 0  . Azilsartan Medoxomil (EDARBI) 40 MG TABS Take 1 tablet by mouth daily. 90 tablet 1   No facility-administered medications prior to visit.     ROS Review of Systems  Constitutional: Negative.  Negative for diaphoresis and fatigue.  HENT: Negative.   Eyes: Negative for visual disturbance.  Respiratory: Negative for cough, chest tightness, shortness of breath and wheezing.   Cardiovascular: Negative for chest pain, palpitations and leg swelling.  Gastrointestinal: Negative.  Negative for abdominal pain, constipation, diarrhea, nausea and vomiting.  Endocrine: Negative.   Genitourinary: Negative.   Musculoskeletal: Negative for arthralgias, myalgias and neck pain.  Skin: Positive for rash. Negative for color change.  Neurological: Negative for dizziness, weakness and headaches.  Hematological: Negative for adenopathy. Does not bruise/bleed easily.  Psychiatric/Behavioral: Negative.     Objective:  BP (!) 156/88   Pulse 68   Temp 98.1 F  (36.7 C) (Oral)   Resp 16   Ht 5\' 4"  (1.626 m)   Wt 172 lb (78 kg)   SpO2 97%   BMI 29.52 kg/m   BP Readings from Last 3 Encounters:  02/07/19 (!) 156/88  01/16/19 (!) 162/88  12/05/17 130/80    Wt Readings from Last 3 Encounters:  02/07/19 172 lb (78 kg)  01/16/19 171 lb 4 oz (77.7 kg)  12/05/17 169 lb (76.7 kg)    Physical Exam Vitals signs reviewed.  Constitutional:      General: She is not in acute distress.    Appearance: She is not ill-appearing, toxic-appearing or diaphoretic.  HENT:     Nose: Nose normal.     Mouth/Throat:     Mouth: Mucous membranes are moist.     Pharynx: No oropharyngeal exudate.  Eyes:     General: No scleral icterus.    Conjunctiva/sclera: Conjunctivae normal.  Neck:     Musculoskeletal: Normal range of motion. No neck rigidity or muscular tenderness.  Cardiovascular:     Rate and Rhythm: Normal rate and regular rhythm.     Heart sounds: No murmur.  Pulmonary:     Effort: Pulmonary effort is normal.     Breath sounds: No stridor. No wheezing, rhonchi or rales.  Abdominal:     General: Abdomen is protuberant. Bowel sounds are normal.     Palpations: There is no hepatomegaly or splenomegaly.     Tenderness: There is no abdominal tenderness.  Musculoskeletal:  Normal range of motion.     Right lower leg: No edema.     Left lower leg: No edema.  Lymphadenopathy:     Cervical: No cervical adenopathy.  Skin:    Findings: Rash present.     Comments: Erythematous macule on the dorsum of the right great toe.  Some lichenification but no scale, vesicles, pustules, central clearing, or raised edges.  See photo.  Erythematous papules scattered around the left calf.  Some in a geographic formation.  See photo.  Neurological:     General: No focal deficit present.     Mental Status: She is alert. Mental status is at baseline.  Psychiatric:        Mood and Affect: Mood normal.        Behavior: Behavior normal.     Lab Results  Component  Value Date   WBC 8.2 01/16/2019   HGB 13.7 01/16/2019   HCT 42.2 01/16/2019   PLT 282.0 01/16/2019   GLUCOSE 77 01/16/2019   CHOL 182 01/16/2019   TRIG 154.0 (H) 01/16/2019   HDL 62.70 01/16/2019   LDLDIRECT 232.0 04/14/2016   LDLCALC 89 01/16/2019   ALT 25 01/16/2019   AST 20 01/16/2019   NA 141 01/16/2019   K 4.7 01/16/2019   CL 105 01/16/2019   CREATININE 0.95 01/16/2019   BUN 19 01/16/2019   CO2 25 01/16/2019   TSH 2.69 01/16/2019   INR 1.0 RATIO 04/04/2008   HGBA1C  11/26/2008    5.7 (NOTE) The ADA recommends the following therapeutic goal for glycemic control related to Hgb A1c measurement: Goal of therapy: <6.5 Hgb A1c  Reference: American Diabetes Association: Clinical Practice Recommendations 2010, Diabetes Care, 2010, 33: (Suppl  1).    No results found.  Assessment & Plan:   Ashley Cruz was seen today for hypertension and rash.  Diagnoses and all orders for this visit:  Essential hypertension- Her blood pressure is not adequately well controlled.  I have asked her to start taking an ARB and thiazide diuretic. -     irbesartan (AVAPRO) 150 MG tablet; Take 1 tablet (150 mg total) by mouth daily. -     indapamide (LOZOL) 1.25 MG tablet; Take 1 tablet (1.25 mg total) by mouth daily.  Hyperlipidemia with target LDL less than 130- She has achieved her LDL goal and is doing well on the statin. -     atorvastatin (LIPITOR) 40 MG tablet; Take 1 tablet (40 mg total) by mouth daily at 6 PM.  Chronic renal disease, stage 3, moderately decreased glomerular filtration rate (GFR) between 30-59 mL/min/1.73 square meter ([place])- Will work towards better control of her blood pressure.  She will avoid nephrotoxic agents. -     irbesartan (AVAPRO) 150 MG tablet; Take 1 tablet (150 mg total) by mouth daily.  Allergic contact dermatitis due to plants, except food -     levocetirizine (XYZAL) 5 MG tablet; Take 1 tablet (5 mg total) by mouth every evening. -     clobetasol ointment  (TEMOVATE) 0.05 %; Apply 1 application topically 2 (two) times daily.   I have discontinued Ashley Cruz's Edarbi. I am also having her start on irbesartan, indapamide, levocetirizine, and clobetasol ointment. Additionally, I am having her maintain her escitalopram, SUMAtriptan, and atorvastatin.  Meds ordered this encounter  Medications  . atorvastatin (LIPITOR) 40 MG tablet    Sig: Take 1 tablet (40 mg total) by mouth daily at 6 PM.    Dispense:  90 tablet  Refill:  1  . irbesartan (AVAPRO) 150 MG tablet    Sig: Take 1 tablet (150 mg total) by mouth daily.    Dispense:  90 tablet    Refill:  1  . indapamide (LOZOL) 1.25 MG tablet    Sig: Take 1 tablet (1.25 mg total) by mouth daily.    Dispense:  90 tablet    Refill:  1  . levocetirizine (XYZAL) 5 MG tablet    Sig: Take 1 tablet (5 mg total) by mouth every evening.    Dispense:  90 tablet    Refill:  0  . clobetasol ointment (TEMOVATE) 0.05 %    Sig: Apply 1 application topically 2 (two) times daily.    Dispense:  60 g    Refill:  1     Follow-up: Return in about 3 months (around 05/10/2019).  Scarlette Calico, MD

## 2019-02-09 ENCOUNTER — Other Ambulatory Visit: Payer: Self-pay | Admitting: Internal Medicine

## 2019-02-23 ENCOUNTER — Other Ambulatory Visit: Payer: Self-pay | Admitting: Internal Medicine

## 2019-02-23 DIAGNOSIS — F418 Other specified anxiety disorders: Secondary | ICD-10-CM

## 2019-02-23 MED ORDER — ESCITALOPRAM OXALATE 10 MG PO TABS: ORAL_TABLET | ORAL | 1 refills | Status: DC

## 2019-03-07 ENCOUNTER — Ambulatory Visit
Admission: RE | Admit: 2019-03-07 | Discharge: 2019-03-07 | Disposition: A | Discharge status: Home / Self Care | Payer: Medicare Other | Source: Ambulatory Visit | Attending: Internal Medicine | Admitting: Internal Medicine

## 2019-03-07 ENCOUNTER — Other Ambulatory Visit: Payer: Self-pay

## 2019-03-07 DIAGNOSIS — Z1231 Encounter for screening mammogram for malignant neoplasm of breast: Secondary | ICD-10-CM

## 2019-03-08 LAB — HM MAMMOGRAPHY

## 2019-05-01 ENCOUNTER — Encounter: Payer: Self-pay | Admitting: Internal Medicine

## 2019-05-02 ENCOUNTER — Other Ambulatory Visit: Payer: Self-pay

## 2019-05-02 ENCOUNTER — Ambulatory Visit (INDEPENDENT_AMBULATORY_CARE_PROVIDER_SITE_OTHER): Payer: Medicare Other | Admitting: Internal Medicine

## 2019-05-02 ENCOUNTER — Encounter: Payer: Self-pay | Admitting: Internal Medicine

## 2019-05-02 ENCOUNTER — Telehealth: Payer: Self-pay | Admitting: Internal Medicine

## 2019-05-02 ENCOUNTER — Other Ambulatory Visit: Payer: Self-pay | Admitting: Internal Medicine

## 2019-05-02 DIAGNOSIS — I10 Essential (primary) hypertension: Secondary | ICD-10-CM | POA: Diagnosis not present

## 2019-05-02 DIAGNOSIS — E785 Hyperlipidemia, unspecified: Secondary | ICD-10-CM

## 2019-05-02 MED ORDER — ATORVASTATIN CALCIUM 40 MG PO TABS: 40.0000 mg | ORAL_TABLET | Freq: Every day | ORAL | 1 refills | Status: DC

## 2019-05-02 MED ORDER — INDAPAMIDE 1.25 MG PO TABS: 1.2500 mg | ORAL_TABLET | Freq: Every day | ORAL | 1 refills | Status: DC

## 2019-05-02 NOTE — Telephone Encounter (Signed)
rx refill atorvastatin (LIPITOR) 40 MG tablet  PHARMACY  [id number] - [place], [place] AT [address] [phone] (Phone) [phone] (Fax)   Patient also needs indapamide sent over again pharmacy is stating they do not have medication refill

## 2019-05-02 NOTE — Patient Instructions (Signed)

## 2019-05-02 NOTE — Progress Notes (Signed)
Subjective:  Patient ID: Ashley Cruz, female    DOB: [birth year]  Age: 73 y.o. MRN: [id number]  CC: Hypertension   HPI Ashley Cruz presents for f/up - She complains that her blood pressure is not adequately well controlled.  She has had intermittent headaches and dizziness.  Her husband had a heart attack 2 months ago so she has not been taking care of her self.  She has not been exercising.  I saw her 3 months ago and prescribed indapamide and irbesartan.  She tells me she is taking irbesartan but was not aware that she was supposed to be taking indapamide so she has not been taking it.  Outpatient Medications Prior to Visit  Medication Sig Dispense Refill  . atorvastatin (LIPITOR) 40 MG tablet Take 1 tablet (40 mg total) by mouth daily at 6 PM. 90 tablet 1  . clobetasol ointment (TEMOVATE) [id number] % Apply 1 application topically 2 (two) times daily. 60 g 1  . escitalopram (LEXAPRO) 10 MG tablet TAKE 1 TABLET(10 MG) BY MOUTH DAILY 90 tablet 1  . irbesartan (AVAPRO) 150 MG tablet Take 1 tablet (150 mg total) by mouth daily. 90 tablet 1  . levocetirizine (XYZAL) 5 MG tablet Take 1 tablet (5 mg total) by mouth every evening. 90 tablet 0  . SUMAtriptan (IMITREX) 50 MG tablet Take 1 tablet (50 mg total) by mouth every 2 (two) hours as needed for migraine. 12 tablet 4  . indapamide (LOZOL) 1.25 MG tablet Take 1 tablet (1.25 mg total) by mouth daily. 90 tablet 1   No facility-administered medications prior to visit.     ROS Review of Systems  Constitutional: Negative for diaphoresis, fatigue and unexpected weight change.  HENT: Negative.   Eyes: Negative for visual disturbance.  Respiratory: Negative for cough, chest tightness, shortness of breath and wheezing.   Cardiovascular: Negative for chest pain, palpitations and leg swelling.  Gastrointestinal: Negative for abdominal pain, constipation, diarrhea, nausea and vomiting.  Endocrine: Negative.   Genitourinary: Negative for  difficulty urinating.  Musculoskeletal: Negative for arthralgias and myalgias.  Skin: Negative for color change, pallor and rash.  Neurological: Positive for dizziness and headaches. Negative for speech difficulty, weakness, light-headedness and numbness.  Hematological: Negative for adenopathy. Does not bruise/bleed easily.  Psychiatric/Behavioral: Negative.     Objective:  BP (!) 156/90 (BP Location: Left Arm, Patient Position: Sitting, Cuff Size: Large)   Pulse 70   Temp 98.8 F (37.1 C) (Oral)   Resp 16   Ht 5\' 4"  (1.626 m)   Wt 172 lb (78 kg)   SpO2 99%   BMI 29.52 kg/m   BP Readings from Last 3 Encounters:  05/02/19 (!) 156/90  02/07/19 (!) 156/88  01/16/19 (!) 162/88    Wt Readings from Last 3 Encounters:  05/02/19 172 lb (78 kg)  02/07/19 172 lb (78 kg)  01/16/19 171 lb 4 oz (77.7 kg)    Physical Exam Vitals signs reviewed.  Constitutional:      Appearance: Normal appearance. She is not ill-appearing or diaphoretic.  HENT:     Nose: Nose normal.     Mouth/Throat:     Mouth: Mucous membranes are moist.  Eyes:     General: No scleral icterus.    Conjunctiva/sclera: Conjunctivae normal.  Neck:     Musculoskeletal: Neck supple.  Cardiovascular:     Rate and Rhythm: Normal rate and regular rhythm.     Heart sounds: No murmur.  Pulmonary:  Effort: Pulmonary effort is normal.     Breath sounds: No stridor. No wheezing, rhonchi or rales.  Abdominal:     General: Abdomen is flat. Bowel sounds are normal. There is no distension.     Palpations: There is no hepatomegaly or splenomegaly.  Musculoskeletal: Normal range of motion.     Right lower leg: No edema.     Left lower leg: No edema.  Lymphadenopathy:     Cervical: No cervical adenopathy.  Skin:    General: Skin is warm and dry.     Coloration: Skin is not pale.  Neurological:     General: No focal deficit present.     Mental Status: She is alert.  Psychiatric:        Mood and Affect: Mood normal.         Behavior: Behavior normal.     Lab Results  Component Value Date   WBC 8.2 01/16/2019   HGB 13.7 01/16/2019   HCT 42.2 01/16/2019   PLT 282.0 01/16/2019   GLUCOSE 77 01/16/2019   CHOL 182 01/16/2019   TRIG 154.0 (H) 01/16/2019   HDL 62.70 01/16/2019   LDLDIRECT 232.0 04/14/2016   LDLCALC 89 01/16/2019   ALT 25 01/16/2019   AST 20 01/16/2019   NA 141 01/16/2019   K 4.7 01/16/2019   CL 105 01/16/2019   CREATININE 0.95 01/16/2019   BUN 19 01/16/2019   CO2 25 01/16/2019   TSH 2.69 01/16/2019   INR 1.0 RATIO 04/04/2008   HGBA1C  11/26/2008    5.7 (NOTE) The ADA recommends the following therapeutic goal for glycemic control related to Hgb A1c measurement: Goal of therapy: <6.5 Hgb A1c  Reference: American Diabetes Association: Clinical Practice Recommendations 2010, Diabetes Care, 2010, 33: (Suppl  1).    Mm 3d Screen Breast Bilateral  Result Date: 03/08/2019 CLINICAL DATA:  Screening. EXAM: DIGITAL SCREENING BILATERAL MAMMOGRAM WITH TOMO AND CAD COMPARISON:  Previous exam(s). ACR Breast Density Category b: There are scattered areas of fibroglandular density. FINDINGS: There are no findings suspicious for malignancy. Images were processed with CAD. IMPRESSION: No mammographic evidence of malignancy. A result letter of this screening mammogram will be mailed directly to the patient. RECOMMENDATION: Screening mammogram in one year. (Code:SM-B-01Y) BI-RADS CATEGORY  1: Negative. Electronically Signed   By: Lovey Newcomer M.D.   On: 03/08/2019 12:10    Assessment & Plan:   Ashley Cruz was seen today for hypertension.  Diagnoses and all orders for this visit:  Essential hypertension- Her blood pressure is not adequately well controlled due to noncompliance.  I sent in the prescription for indapamide again.  I encouraged her to take both indapamide and irbesartan.  I have also encouraged her to improve her lifestyle modifications.  She agrees to return in about 2 months for me to  recheck her blood pressure. -     indapamide (LOZOL) 1.25 MG tablet; Take 1 tablet (1.25 mg total) by mouth daily.   I am having Ashley Cruz maintain her SUMAtriptan, atorvastatin, irbesartan, levocetirizine, clobetasol ointment, escitalopram, and indapamide.  Meds ordered this encounter  Medications  . indapamide (LOZOL) 1.25 MG tablet    Sig: Take 1 tablet (1.25 mg total) by mouth daily.    Dispense:  90 tablet    Refill:  1     Follow-up: Return in about 3 months (around 08/02/2019).  Scarlette Calico, MD

## 2019-05-09 ENCOUNTER — Encounter: Payer: Self-pay | Admitting: Internal Medicine

## 2019-05-11 ENCOUNTER — Other Ambulatory Visit: Payer: Self-pay | Admitting: Internal Medicine

## 2019-05-11 ENCOUNTER — Encounter: Payer: Self-pay | Admitting: Internal Medicine

## 2019-05-11 ENCOUNTER — Ambulatory Visit (INDEPENDENT_AMBULATORY_CARE_PROVIDER_SITE_OTHER): Payer: Medicare Other | Admitting: Internal Medicine

## 2019-05-11 ENCOUNTER — Other Ambulatory Visit (INDEPENDENT_AMBULATORY_CARE_PROVIDER_SITE_OTHER): Payer: Medicare Other

## 2019-05-11 ENCOUNTER — Other Ambulatory Visit: Payer: Self-pay

## 2019-05-11 VITALS — BP 110/84 | HR 76 | Temp 98.3°F | Ht 64.0 in | Wt 170.0 lb

## 2019-05-11 DIAGNOSIS — I10 Essential (primary) hypertension: Secondary | ICD-10-CM

## 2019-05-11 LAB — COMPREHENSIVE METABOLIC PANEL
ALT: 22 U/L (ref 0–35)
AST: 16 U/L (ref 0–37)
Albumin: 4.5 g/dL (ref 3.5–5.2)
Alkaline Phosphatase: 80 U/L (ref 39–117)
BUN: 23 mg/dL (ref 6–23)
CO2: 27 mEq/L (ref 19–32)
Calcium: 9.5 mg/dL (ref 8.4–10.5)
Chloride: 103 mEq/L (ref 96–112)
Creatinine, Ser: 1 mg/dL (ref 0.40–1.20)
GFR: 54.48 mL/min — ABNORMAL LOW (ref >60.00)
Glucose, Bld: 96 mg/dL (ref 70–99)
Potassium: 3.7 mEq/L (ref 3.5–5.1)
Sodium: 140 mEq/L (ref 135–145)
Total Bilirubin: 0.7 mg/dL (ref 0.2–1.2)
Total Protein: 7.6 g/dL (ref 6.0–8.3)

## 2019-05-11 LAB — CBC
HCT: 44.3 % (ref 36.0–46.0)
Hemoglobin: 14.5 g/dL (ref 12.0–15.0)
MCHC: 32.7 g/dL (ref 30.0–36.0)
MCV: 93.9 fl (ref 78.0–100.0)
Platelets: 297 10*3/uL (ref 150.0–400.0)
RBC: 4.72 Mil/uL (ref 3.87–5.11)
RDW: 14 % (ref 11.5–15.5)
WBC: 6.1 10*3/uL (ref 4.0–10.5)

## 2019-05-11 LAB — URINALYSIS, ROUTINE W REFLEX MICROSCOPIC
Bilirubin Urine: NEGATIVE
Hgb urine dipstick: NEGATIVE
Ketones, ur: NEGATIVE
Nitrite: POSITIVE — AB
RBC / HPF: NONE SEEN (ref 0–?)
Specific Gravity, Urine: 1.025 (ref 1.000–1.030)
Urine Glucose: NEGATIVE
Urobilinogen, UA: 0.2 (ref 0.0–1.0)
pH: 5.5 (ref 5.0–8.0)

## 2019-05-11 MED ORDER — NITROFURANTOIN MONOHYD MACRO 100 MG PO CAPS: 100.0000 mg | ORAL_CAPSULE | Freq: Two times a day (BID) | ORAL | 0 refills | Status: DC

## 2019-05-11 NOTE — Progress Notes (Signed)
   Subjective:   Patient ID: Ashley Cruz, female    DOB: [birth year], 73 y.o.   MRN: [id number]  HPI The patient is a 73 YO female coming in for weakness and dizziness. She saw PCP on 05/02/19 with elevated BP and he asked her to start taking indapamide. She was already taking irbesartan for about 6 months at that time. Within 2-3 days she started feeling dizzy and weak all the time. She feels as though she may pass out with standing. She is now using cane for stability. She denies change in diet or exercise. Cutting back slightly on food but not significantly. She denies other new medications or changes. Denies symptoms of covid-19 or known exposure. She is still taking indapamide. BP running about 110/70-80 at home with previously running 150s before the indapamide.   Review of Systems  Constitutional: Positive for activity change.  HENT: Negative.   Eyes: Negative.   Respiratory: Negative for cough, chest tightness and shortness of breath.   Cardiovascular: Negative for chest pain, palpitations and leg swelling.  Gastrointestinal: Negative for abdominal distention, abdominal pain, constipation, diarrhea, nausea and vomiting.  Musculoskeletal: Negative.   Skin: Negative.   Neurological: Positive for dizziness and weakness.  Psychiatric/Behavioral: Negative.     Objective:  Physical Exam Constitutional:      Appearance: She is well-developed.  HENT:     Head: Normocephalic and atraumatic.  Neck:     Musculoskeletal: Normal range of motion.  Cardiovascular:     Rate and Rhythm: Normal rate and regular rhythm.  Pulmonary:     Effort: Pulmonary effort is normal. No respiratory distress.     Breath sounds: Normal breath sounds. No wheezing or rales.  Abdominal:     General: Bowel sounds are normal. There is no distension.     Palpations: Abdomen is soft.     Tenderness: There is no abdominal tenderness. There is no rebound.  Skin:    General: Skin is warm and dry.  Neurological:      Mental Status: She is alert and oriented to person, place, and time.     Coordination: Coordination normal.     Comments: Lightheaded with standing     Vitals:   05/11/19 1056  BP: 110/84  Pulse: 76  Temp: 98.3 F (36.8 C)  TempSrc: Oral  SpO2: 96%  Weight: 170 lb (77.1 kg)  Height: 5\' 4"  (1.626 m)    Assessment & Plan:

## 2019-05-11 NOTE — Patient Instructions (Signed)
Stop the indapamide. In 1 week let us know how you are doing or sooner if needed.   If blood pressure goes back up we will replace it with a weaker medicine called amlodipine.

## 2019-05-11 NOTE — Assessment & Plan Note (Signed)
She is asked to stop indapamide as this is likely too strong for her. Continue irbesartan and wait 1-2 weeks. If symptoms clear will monitor BP. If BP elevates will start amlodipine 5 mg daily and have her follow up with PCP in 1 month for titration if needed.

## 2019-08-05 ENCOUNTER — Other Ambulatory Visit: Payer: Self-pay | Admitting: Internal Medicine

## 2019-08-05 ENCOUNTER — Encounter: Payer: Self-pay | Admitting: Internal Medicine

## 2019-08-05 DIAGNOSIS — I10 Essential (primary) hypertension: Secondary | ICD-10-CM

## 2019-08-05 DIAGNOSIS — N183 Chronic kidney disease, stage 3 unspecified: Secondary | ICD-10-CM

## 2019-08-05 DIAGNOSIS — F418 Other specified anxiety disorders: Secondary | ICD-10-CM

## 2019-08-07 ENCOUNTER — Encounter: Payer: Self-pay | Admitting: Internal Medicine

## 2019-08-11 ENCOUNTER — Ambulatory Visit: Payer: Medicare Other | Attending: Internal Medicine

## 2019-08-11 DIAGNOSIS — Z23 Encounter for immunization: Secondary | ICD-10-CM

## 2019-08-11 NOTE — Progress Notes (Signed)
   Covid-19 Vaccination Clinic  Name:  AVANTI RITTENBERRY    MRN: [id number] DOB: 09-19-1945  08/11/2019  Ms. Schmelz was observed post Covid-19 immunization for 15 minutes without incidence. She was provided with Vaccine Information Sheet and instruction to access the V-Safe system.   Ms. Cavasos was instructed to call 911 with any severe reactions post vaccine: Marland Kitchen Difficulty breathing  . Swelling of your face and throat  . A fast heartbeat  . A bad rash all over your body  . Dizziness and weakness    Immunizations Administered    Name Date Dose VIS Date Route   Pfizer COVID-19 Vaccine 08/11/2019 11:09 AM 0.3 mL 06/29/2019 Intramuscular   Manufacturer: [place]   Lot: [id number]   [place]: [id number]

## 2019-09-01 ENCOUNTER — Ambulatory Visit: Payer: Medicare Other | Attending: Internal Medicine

## 2019-09-01 DIAGNOSIS — Z23 Encounter for immunization: Secondary | ICD-10-CM

## 2019-09-01 NOTE — Progress Notes (Signed)
   Covid-19 Vaccination Clinic  Name:  Ashley Cruz    MRN: [id number] DOB: 09/28/1945  09/01/2019  Ashley Cruz was observed post Covid-19 immunization for 15 minutes without incidence. She was provided with Vaccine Information Sheet and instruction to access the V-Safe system.   Ashley Cruz was instructed to call 911 with any severe reactions post vaccine: Marland Kitchen Difficulty breathing  . Swelling of your face and throat  . A fast heartbeat  . A bad rash all over your body  . Dizziness and weakness    Immunizations Administered    Name Date Dose VIS Date Route   Pfizer COVID-19 Vaccine 09/01/2019 10:00 AM 0.3 mL 06/29/2019 Intramuscular   Manufacturer: [place]   Lot: [id number]   [place]: [id number]

## 2019-09-23 ENCOUNTER — Encounter: Payer: Self-pay | Admitting: Internal Medicine

## 2019-10-21 ENCOUNTER — Encounter: Payer: Self-pay | Admitting: Internal Medicine

## 2019-10-23 NOTE — Telephone Encounter (Signed)
Can you print rx for pt - for saxenda?

## 2019-10-24 ENCOUNTER — Other Ambulatory Visit: Payer: Self-pay | Admitting: Internal Medicine

## 2019-10-24 ENCOUNTER — Encounter: Payer: Self-pay | Admitting: Internal Medicine

## 2019-10-29 ENCOUNTER — Encounter: Payer: Self-pay | Admitting: Internal Medicine

## 2019-10-31 ENCOUNTER — Other Ambulatory Visit: Payer: Self-pay | Admitting: Internal Medicine

## 2019-10-31 DIAGNOSIS — E6609 Other obesity due to excess calories: Secondary | ICD-10-CM

## 2019-10-31 DIAGNOSIS — Z6830 Body mass index (BMI) 30.0-30.9, adult: Secondary | ICD-10-CM

## 2019-10-31 DIAGNOSIS — E785 Hyperlipidemia, unspecified: Secondary | ICD-10-CM

## 2019-10-31 MED ORDER — SAXENDA 18 MG/3ML ~~LOC~~ SOPN: 3.0000 mg | PEN_INJECTOR | Freq: Every day | SUBCUTANEOUS | 5 refills | Status: DC

## 2019-11-02 ENCOUNTER — Encounter: Payer: Self-pay | Admitting: Internal Medicine

## 2019-11-02 ENCOUNTER — Telehealth: Payer: Self-pay | Admitting: Internal Medicine

## 2019-11-02 MED ORDER — PEN NEEDLES 32G X 4 MM MISC: 1.0000 | Freq: Every day | 0 refills | Status: DC

## 2019-11-02 NOTE — Telephone Encounter (Signed)
New message:   Pt is calling and would like to discuss some new medications she picked up today. She states that no needles came with the prescription Please advise.

## 2019-11-02 NOTE — Telephone Encounter (Signed)
Called pt and she stated that she needed the pen needles. Erx for pen needles with allowed subs, set to pof.

## 2019-11-08 ENCOUNTER — Encounter: Payer: Self-pay | Admitting: Internal Medicine

## 2019-11-12 ENCOUNTER — Ambulatory Visit: Payer: Medicare Other

## 2019-11-12 ENCOUNTER — Other Ambulatory Visit: Payer: Self-pay | Admitting: Internal Medicine

## 2019-11-12 DIAGNOSIS — I10 Essential (primary) hypertension: Secondary | ICD-10-CM

## 2019-12-19 ENCOUNTER — Ambulatory Visit (INDEPENDENT_AMBULATORY_CARE_PROVIDER_SITE_OTHER): Payer: Medicare Other | Admitting: Internal Medicine

## 2019-12-19 ENCOUNTER — Encounter: Payer: Self-pay | Admitting: Internal Medicine

## 2019-12-19 ENCOUNTER — Ambulatory Visit: Payer: Medicare Other

## 2019-12-19 ENCOUNTER — Other Ambulatory Visit: Payer: Self-pay

## 2019-12-19 VITALS — BP 138/82 | HR 70 | Temp 97.9°F | Resp 16 | Ht 64.0 in | Wt 159.0 lb

## 2019-12-19 DIAGNOSIS — E785 Hyperlipidemia, unspecified: Secondary | ICD-10-CM | POA: Diagnosis not present

## 2019-12-19 DIAGNOSIS — N1831 Chronic kidney disease, stage 3a: Secondary | ICD-10-CM

## 2019-12-19 DIAGNOSIS — Z1211 Encounter for screening for malignant neoplasm of colon: Secondary | ICD-10-CM

## 2019-12-19 DIAGNOSIS — I10 Essential (primary) hypertension: Secondary | ICD-10-CM | POA: Diagnosis not present

## 2019-12-19 LAB — HEPATIC FUNCTION PANEL
ALT: 19 U/L (ref 0–35)
AST: 23 U/L (ref 0–37)
Albumin: 4.4 g/dL (ref 3.5–5.2)
Alkaline Phosphatase: 77 U/L (ref 39–117)
Bilirubin, Direct: 0.1 mg/dL (ref 0.0–0.3)
Total Bilirubin: 0.5 mg/dL (ref 0.2–1.2)
Total Protein: 7.2 g/dL (ref 6.0–8.3)

## 2019-12-19 LAB — TSH: TSH: 2.2 u[IU]/mL (ref 0.35–4.50)

## 2019-12-19 LAB — CBC WITH DIFFERENTIAL/PLATELET
Basophils Absolute: 0.1 10*3/uL (ref 0.0–0.1)
Basophils Relative: 0.7 % (ref 0.0–3.0)
Eosinophils Absolute: 0.2 10*3/uL (ref 0.0–0.7)
Eosinophils Relative: 2.7 % (ref 0.0–5.0)
HCT: 40.9 % (ref 36.0–46.0)
Hemoglobin: 13.4 g/dL (ref 12.0–15.0)
Lymphocytes Relative: 43.5 % (ref 12.0–46.0)
Lymphs Abs: 3.3 10*3/uL (ref 0.7–4.0)
MCHC: 32.8 g/dL (ref 30.0–36.0)
MCV: 94.5 fl (ref 78.0–100.0)
Monocytes Absolute: 0.8 10*3/uL (ref 0.1–1.0)
Monocytes Relative: 10.3 % (ref 3.0–12.0)
Neutro Abs: 3.2 10*3/uL (ref 1.4–7.7)
Neutrophils Relative %: 42.8 % — ABNORMAL LOW (ref 43.0–77.0)
Platelets: 269 10*3/uL (ref 150.0–400.0)
RBC: 4.33 Mil/uL (ref 3.87–5.11)
RDW: 14.1 % (ref 11.5–15.5)
WBC: 7.5 10*3/uL (ref 4.0–10.5)

## 2019-12-19 LAB — BASIC METABOLIC PANEL
BUN: 21 mg/dL (ref 6–23)
CO2: 29 mEq/L (ref 19–32)
Calcium: 9.6 mg/dL (ref 8.4–10.5)
Chloride: 105 mEq/L (ref 96–112)
Creatinine, Ser: 0.99 mg/dL (ref 0.40–1.20)
GFR: 55.02 mL/min — ABNORMAL LOW (ref >60.00)
Glucose, Bld: 88 mg/dL (ref 70–99)
Potassium: 4 mEq/L (ref 3.5–5.1)
Sodium: 139 mEq/L (ref 135–145)

## 2019-12-19 LAB — LIPID PANEL
Cholesterol: 167 mg/dL (ref 0–200)
HDL: 57.1 mg/dL (ref >39.00)
LDL Cholesterol: 87 mg/dL (ref 0–99)
NonHDL: 109.82
Total CHOL/HDL Ratio: 3
Triglycerides: 114 mg/dL (ref 0.0–149.0)
VLDL: 22.8 mg/dL (ref 0.0–40.0)

## 2019-12-19 NOTE — Progress Notes (Signed)
Subjective:  Patient ID: Ashley Cruz, female    DOB: 1946/04/01  Age: 74 y.o. MRN: [id number]  CC: Hypertension and Hyperlipidemia  This visit occurred during the SARS-CoV-2 public health emergency.  Safety protocols were in place, including screening questions prior to the visit, additional usage of staff PPE, and extensive cleaning of exam room while observing appropriate contact time as indicated for disinfecting solutions.    HPI Ashley Cruz presents for f/up - She has been able to lose weight recently with lifestyle modifications and the use of a GLP-1 agonist.  She is very active and denies any recent episodes of chest pain, shortness of breath, palpitations, edema, or fatigue.  Outpatient Medications Prior to Visit  Medication Sig Dispense Refill  . atorvastatin (LIPITOR) 40 MG tablet TAKE 1 TABLET(40 MG) BY MOUTH DAILY AT 6 PM 90 tablet 1  . clobetasol ointment (TEMOVATE) [id number] % Apply 1 application topically 2 (two) times daily. 60 g 1  . escitalopram (LEXAPRO) 10 MG tablet TAKE 1 TABLET(10 MG) BY MOUTH DAILY 90 tablet 1  . indapamide (LOZOL) 1.25 MG tablet Take 1.25 mg by mouth daily.    . Insulin Pen Needle (PEN NEEDLES) 32G X 4 MM MISC 1 each by Does not apply route daily. Use to inject insulin once daily. 100 each 0  . irbesartan (AVAPRO) 150 MG tablet TAKE 1 TABLET(150 MG) BY MOUTH DAILY 90 tablet 1  . levocetirizine (XYZAL) 5 MG tablet Take 1 tablet (5 mg total) by mouth every evening. 90 tablet 0  . Liraglutide -Weight Management (SAXENDA) 18 MG/3ML SOPN Inject 0.5 mLs (3 mg total) into the skin daily. 5 pen 5  . SUMAtriptan (IMITREX) 50 MG tablet Take 1 tablet (50 mg total) by mouth every 2 (two) hours as needed for migraine. 12 tablet 4   No facility-administered medications prior to visit.    ROS Review of Systems  Constitutional: Negative.  Negative for diaphoresis, fatigue and unexpected weight change.  HENT: Negative.   Eyes: Negative.   Respiratory:  Negative for cough, chest tightness, shortness of breath and wheezing.   Cardiovascular: Negative for chest pain, palpitations and leg swelling.  Gastrointestinal: Negative for abdominal pain, constipation, diarrhea, nausea and vomiting.  Endocrine: Negative.   Genitourinary: Negative.  Negative for difficulty urinating.  Musculoskeletal: Negative for arthralgias and myalgias.  Skin: Negative.  Negative for color change and pallor.  Neurological: Negative.  Negative for dizziness, weakness and light-headedness.  Hematological: Negative for adenopathy. Does not bruise/bleed easily.  Psychiatric/Behavioral: Negative.     Objective:  BP 138/82 (BP Location: Left Arm, Patient Position: Sitting, Cuff Size: Large)   Pulse 70   Temp 97.9 F (36.6 C) (Oral)   Resp 16   Ht 5\' 4"  (1.626 m)   Wt 159 lb (72.1 kg)   SpO2 96%   BMI 27.29 kg/m   BP Readings from Last 3 Encounters:  12/19/19 138/82  05/11/19 110/84  05/02/19 (!) 156/90    Wt Readings from Last 3 Encounters:  12/19/19 159 lb (72.1 kg)  05/11/19 170 lb (77.1 kg)  05/02/19 172 lb (78 kg)    Physical Exam Vitals reviewed.  Constitutional:      Appearance: Normal appearance.  HENT:     Nose: Nose normal.     Mouth/Throat:     Mouth: Mucous membranes are moist.  Eyes:     General: No scleral icterus.    Conjunctiva/sclera: Conjunctivae normal.  Cardiovascular:     Rate and  Rhythm: Normal rate and regular rhythm.     Heart sounds: No murmur.  Pulmonary:     Effort: Pulmonary effort is normal.     Breath sounds: No stridor. No wheezing, rhonchi or rales.  Abdominal:     General: Abdomen is flat.     Palpations: There is no mass.     Tenderness: There is no abdominal tenderness. There is no guarding.  Musculoskeletal:        General: Normal range of motion.     Cervical back: Neck supple.     Right lower leg: No edema.     Left lower leg: No edema.  Lymphadenopathy:     Cervical: No cervical adenopathy.   Skin:    General: Skin is warm and dry.     Coloration: Skin is not pale.  Neurological:     General: No focal deficit present.     Mental Status: She is alert and oriented to person, place, and time. Mental status is at baseline.  Psychiatric:        Mood and Affect: Mood normal.     Lab Results  Component Value Date   WBC 7.5 12/19/2019   HGB 13.4 12/19/2019   HCT 40.9 12/19/2019   PLT 269.0 12/19/2019   GLUCOSE 88 12/19/2019   CHOL 167 12/19/2019   TRIG 114.0 12/19/2019   HDL 57.10 12/19/2019   LDLDIRECT 232.0 04/14/2016   LDLCALC 87 12/19/2019   ALT 19 12/19/2019   AST 23 12/19/2019   NA 139 12/19/2019   K 4.0 12/19/2019   CL 105 12/19/2019   CREATININE 0.99 12/19/2019   BUN 21 12/19/2019   CO2 29 12/19/2019   TSH 2.20 12/19/2019   INR 1.0 RATIO 04/04/2008   HGBA1C  11/26/2008    5.7 (NOTE) The ADA recommends the following therapeutic goal for glycemic control related to Hgb A1c measurement: Goal of therapy: <6.5 Hgb A1c  Reference: American Diabetes Association: Clinical Practice Recommendations 2010, Diabetes Care, 2010, 33: (Suppl  1).    MM 3D SCREEN BREAST BILATERAL  Result Date: 03/08/2019 CLINICAL DATA:  Screening. EXAM: DIGITAL SCREENING BILATERAL MAMMOGRAM WITH TOMO AND CAD COMPARISON:  Previous exam(s). ACR Breast Density Category b: There are scattered areas of fibroglandular density. FINDINGS: There are no findings suspicious for malignancy. Images were processed with CAD. IMPRESSION: No mammographic evidence of malignancy. A result letter of this screening mammogram will be mailed directly to the patient. RECOMMENDATION: Screening mammogram in one year. (Code:SM-B-01Y) BI-RADS CATEGORY  1: Negative. Electronically Signed   By: Lovey Newcomer M.D.   On: 03/08/2019 12:10    Assessment & Plan:   Ashley was seen today for hypertension and hyperlipidemia.  Diagnoses and all orders for this visit:  Essential hypertension- Her blood pressure is adequately  well controlled. -     CBC with Differential/Platelet; Future -     Basic metabolic panel; Future -     Urinalysis, Routine w reflex microscopic; Future -     Urinalysis, Routine w reflex microscopic -     Basic metabolic panel -     CBC with Differential/Platelet  Stage 3a chronic kidney disease- Her renal function is stable.  Will maintain control of her blood pressure.  She will avoid nephrotoxic agents. -     CBC with Differential/Platelet; Future -     Basic metabolic panel; Future -     Urinalysis, Routine w reflex microscopic; Future -     Urinalysis, Routine w reflex microscopic -  Basic metabolic panel -     CBC with Differential/Platelet  Hyperlipidemia with target LDL less than 130- She has achieved her LDL goal and is doing well on the statin. -     Lipid panel; Future -     Hepatic function panel; Future -     TSH; Future -     TSH -     Hepatic function panel -     Lipid panel  Colon cancer screening -     Cologuard   I am having Ashley Cruz maintain her SUMAtriptan, levocetirizine, clobetasol ointment, escitalopram, irbesartan, atorvastatin, Saxenda, Pen Needles, and indapamide.  No orders of the defined types were placed in this encounter.    Follow-up: Return in about 6 months (around 06/19/2020).  Scarlette Calico, MD

## 2019-12-19 NOTE — Patient Instructions (Signed)

## 2019-12-20 ENCOUNTER — Encounter: Payer: Self-pay | Admitting: Internal Medicine

## 2019-12-20 LAB — URINALYSIS, ROUTINE W REFLEX MICROSCOPIC
Bilirubin Urine: NEGATIVE
Hgb urine dipstick: NEGATIVE
Nitrite: POSITIVE — AB
RBC / HPF: NONE SEEN (ref 0–?)
Specific Gravity, Urine: >=1.03 — AB (ref 1.000–1.030)
Total Protein, Urine: NEGATIVE
Urine Glucose: NEGATIVE
Urobilinogen, UA: 0.2 (ref 0.0–1.0)
pH: 5.5 (ref 5.0–8.0)

## 2019-12-26 NOTE — Telephone Encounter (Signed)
Dr. Ronnald Ramp,  Would you like patient to come in for another appointment or would you like to treat empirically?

## 2020-01-29 ENCOUNTER — Other Ambulatory Visit: Payer: Self-pay | Admitting: Internal Medicine

## 2020-01-29 DIAGNOSIS — F418 Other specified anxiety disorders: Secondary | ICD-10-CM

## 2020-02-01 ENCOUNTER — Other Ambulatory Visit: Payer: Self-pay | Admitting: Internal Medicine

## 2020-02-01 DIAGNOSIS — I10 Essential (primary) hypertension: Secondary | ICD-10-CM

## 2020-02-01 DIAGNOSIS — N183 Chronic kidney disease, stage 3 unspecified: Secondary | ICD-10-CM

## 2020-02-05 IMAGING — MG DIGITAL SCREENING BILATERAL MAMMOGRAM WITH TOMO AND CAD
8 series · 8 of 24 positions shown · non-contrast
Comparison: Previous exam(s).

CLINICAL DATA: Screening.

EXAM:
DIGITAL SCREENING BILATERAL MAMMOGRAM WITH TOMO AND CAD

[L MLO synth-2D]
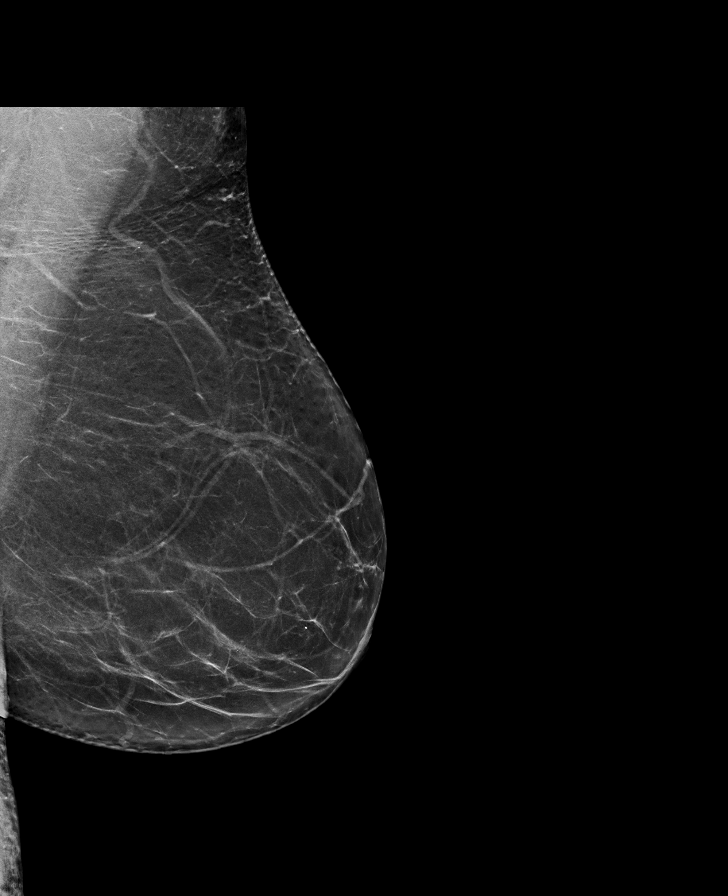

[R MLO synth-2D]
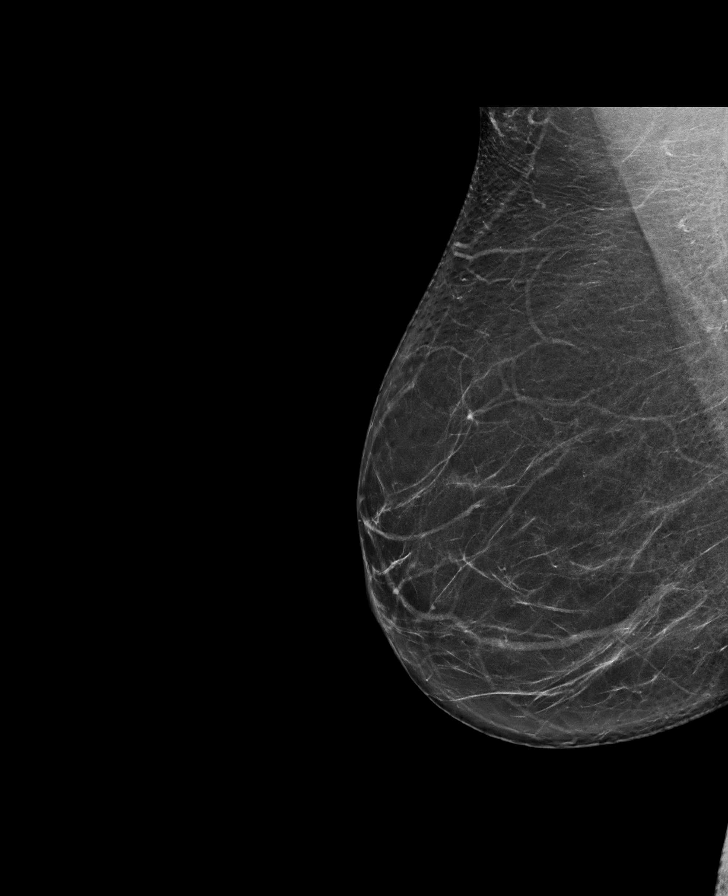

[R CC synth-2D]
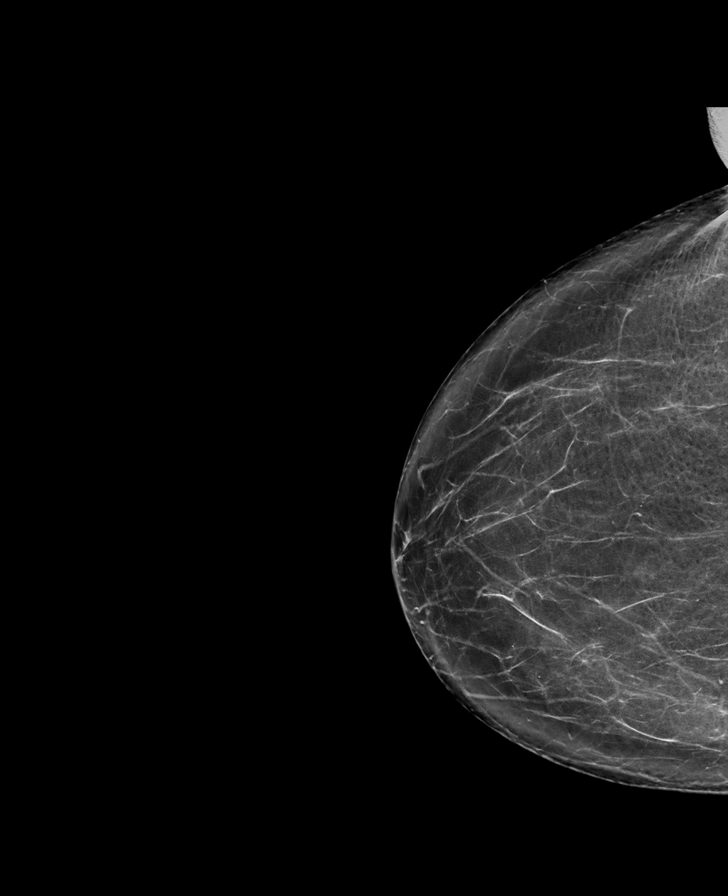

[L CC synth-2D]
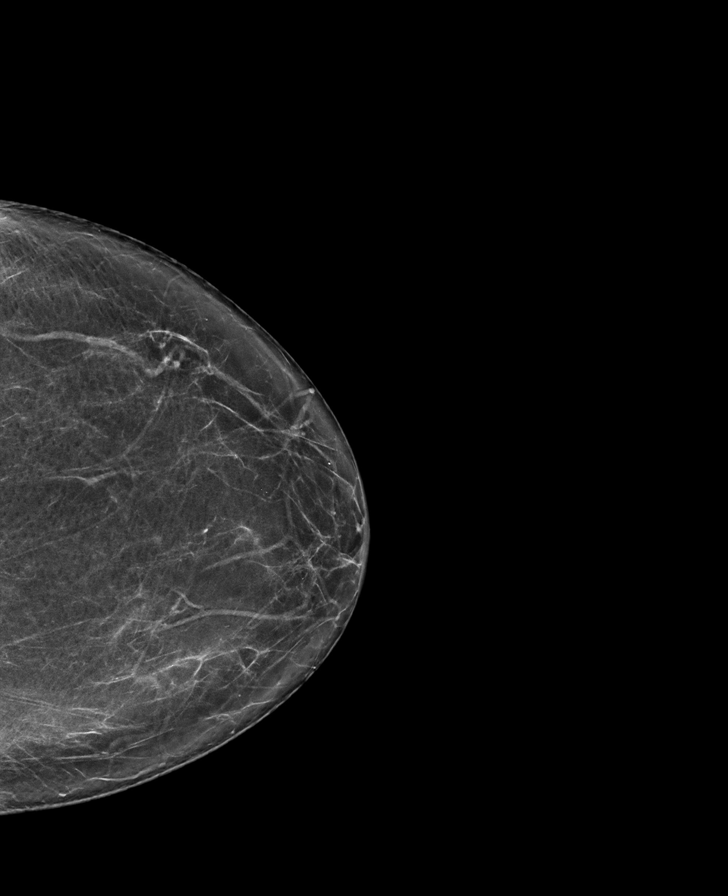

[L CC tomo · tomo slice 36/71.0]
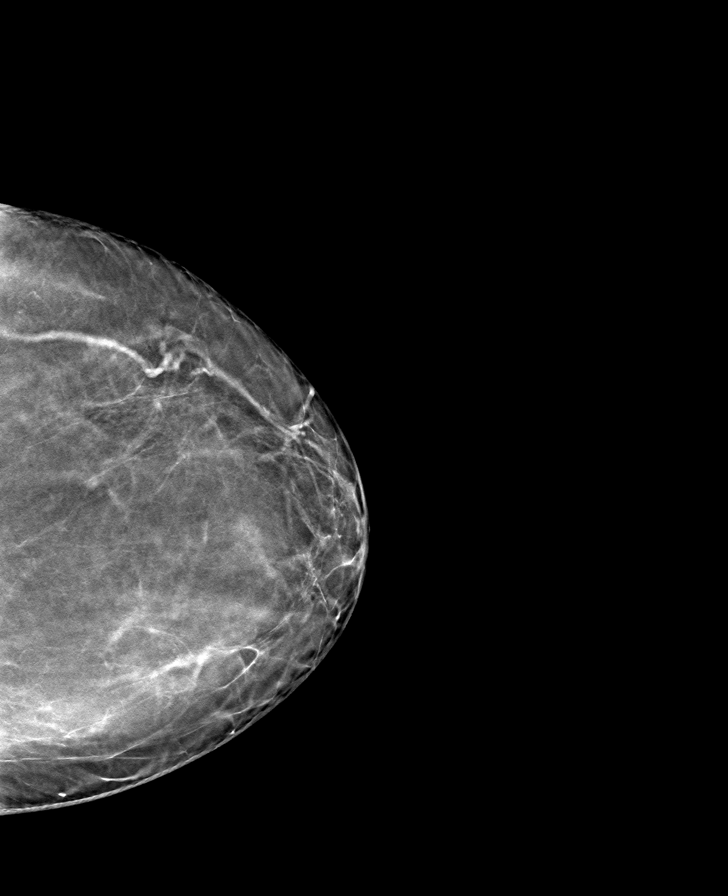

[R MLO tomo · tomo slice 39/76.0]
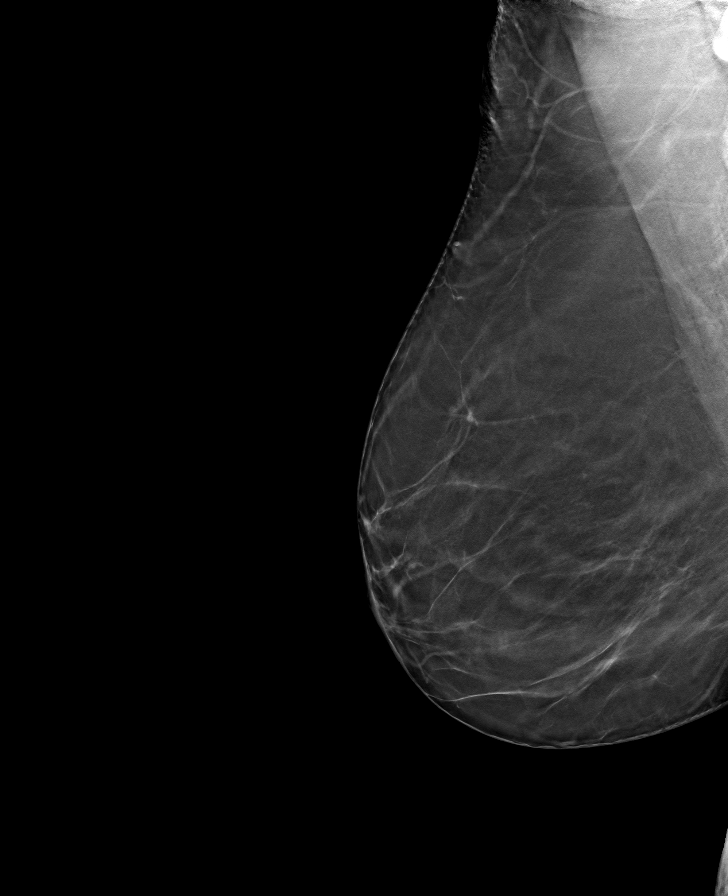

[L MLO tomo · tomo slice 39/78.0]
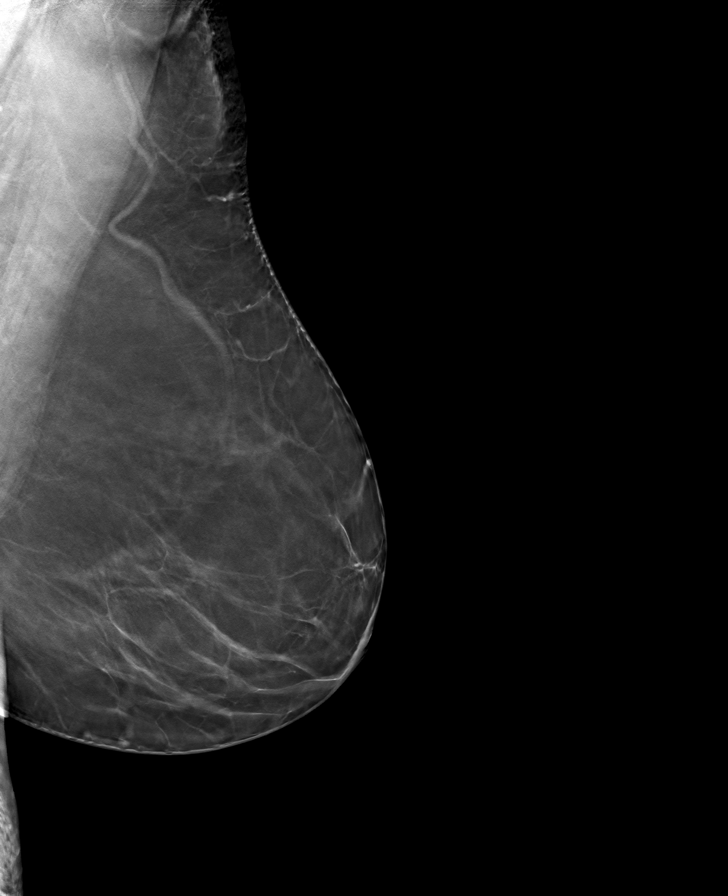

[R CC tomo · tomo slice 38/75.0]
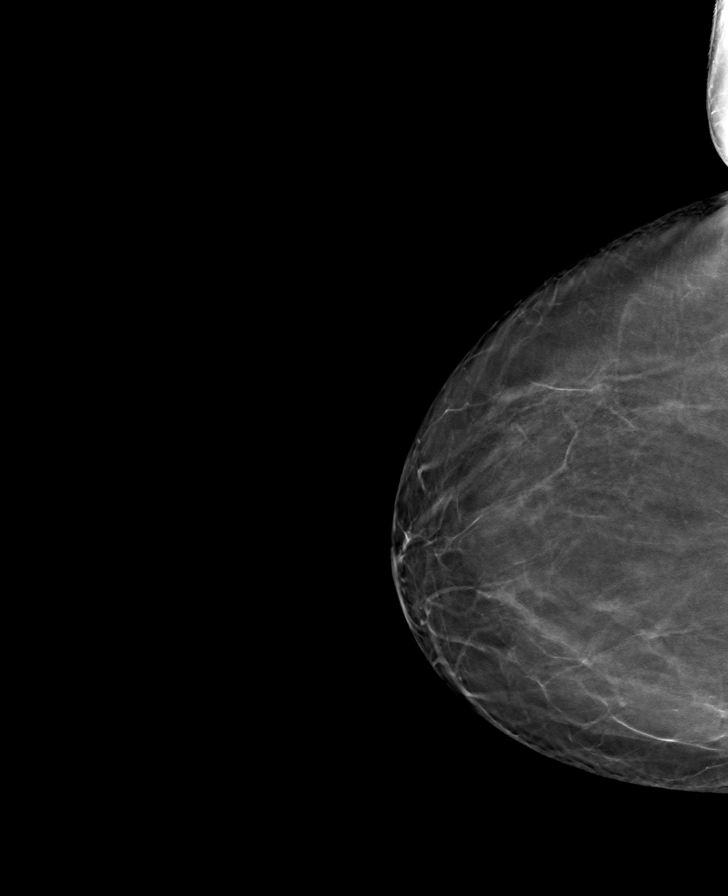

[8 of 24 positions shown; findings below may reference images not displayed]

ACR Breast Density Category b: There are scattered areas of
fibroglandular density.
FINDINGS: There are no findings suspicious for malignancy. Images were
processed with CAD.
IMPRESSION: No mammographic evidence of malignancy. A result letter of this
screening mammogram will be mailed directly to the patient.

RECOMMENDATION:
Screening mammogram in one year. (Code:CN-U-775)

BI-RADS CATEGORY  1: Negative.

## 2020-03-05 ENCOUNTER — Encounter: Payer: Self-pay | Admitting: Internal Medicine

## 2020-03-05 ENCOUNTER — Ambulatory Visit (INDEPENDENT_AMBULATORY_CARE_PROVIDER_SITE_OTHER): Payer: Medicare Other

## 2020-03-05 ENCOUNTER — Other Ambulatory Visit: Payer: Self-pay

## 2020-03-05 ENCOUNTER — Ambulatory Visit (INDEPENDENT_AMBULATORY_CARE_PROVIDER_SITE_OTHER): Payer: Medicare Other | Admitting: Internal Medicine

## 2020-03-05 VITALS — BP 122/80 | HR 68 | Temp 98.1°F | Resp 16 | Ht 64.0 in | Wt 163.0 lb

## 2020-03-05 VITALS — BP 122/80 | HR 68 | Temp 98.1°F | Resp 16 | Ht 64.0 in | Wt 163.2 lb

## 2020-03-05 DIAGNOSIS — N3000 Acute cystitis without hematuria: Secondary | ICD-10-CM | POA: Diagnosis not present

## 2020-03-05 DIAGNOSIS — Z0000 Encounter for general adult medical examination without abnormal findings: Secondary | ICD-10-CM

## 2020-03-05 DIAGNOSIS — R300 Dysuria: Secondary | ICD-10-CM

## 2020-03-05 DIAGNOSIS — Z1211 Encounter for screening for malignant neoplasm of colon: Secondary | ICD-10-CM

## 2020-03-05 LAB — POC URINALSYSI DIPSTICK (AUTOMATED)
Bilirubin, UA: NEGATIVE
Blood, UA: NEGATIVE
Glucose, UA: NEGATIVE
Ketones, UA: NEGATIVE
Nitrite, UA: POSITIVE
Protein, UA: POSITIVE — AB
Spec Grav, UA: 1.025 (ref 1.010–1.025)
Urobilinogen, UA: 0.2 E.U./dL
pH, UA: 5.5 (ref 5.0–8.0)

## 2020-03-05 MED ORDER — SULFAMETHOXAZOLE-TRIMETHOPRIM 800-160 MG PO TABS: 1.0000 | ORAL_TABLET | Freq: Two times a day (BID) | ORAL | 0 refills | Status: AC

## 2020-03-05 NOTE — Patient Instructions (Signed)

## 2020-03-05 NOTE — Progress Notes (Signed)
Subjective:   Ashley Cruz is a 74 y.o. female who presents for Medicare Annual (Subsequent) preventive examination.  Review of Systems    No ROS. Medicare Wellness Visit Cardiac Risk Factors include: advanced age (>59men, >22 women);dyslipidemia;hypertension     Objective:    Today's Vitals   03/05/20 1359  BP: 122/80  Pulse: 68  Resp: 16  Temp: 98.1 F (36.7 C)  SpO2: 97%  Weight: 163 lb 3.2 oz (74 kg)  Height: 5\' 4"  (1.626 m)  PainSc: 0-No pain   Body mass index is 28.01 kg/m.  Advanced Directives 03/05/2020 11/09/2018 11/07/2017 11/03/2016  Does Patient Have a Medical Advance Directive? Yes No No Yes  Type of Advance Directive Living will;Healthcare Power of Attorney - - -  Does patient want to make changes to medical advance directive? No - Patient declined - Yes (ED - Information included in AVS) Yes (ED - Information included in AVS)  Would patient like information on creating a medical advance directive? - Yes (ED - Information included in AVS) - -    Current Medications (verified) Outpatient Encounter Medications as of 03/05/2020  Medication Sig  . atorvastatin (LIPITOR) 40 MG tablet TAKE 1 TABLET(40 MG) BY MOUTH DAILY AT 6 PM  . clobetasol ointment (TEMOVATE) 1.77 % Apply 1 application topically 2 (two) times daily.  Marland Kitchen escitalopram (LEXAPRO) 10 MG tablet TAKE 1 TABLET(10 MG) BY MOUTH DAILY  . indapamide (LOZOL) 1.25 MG tablet Take 1.25 mg by mouth daily.  . Insulin Pen Needle (PEN NEEDLES) 32G X 4 MM MISC 1 each by Does not apply route daily. Use to inject insulin once daily.  . irbesartan (AVAPRO) 150 MG tablet TAKE 1 TABLET(150 MG) BY MOUTH DAILY  . levocetirizine (XYZAL) 5 MG tablet Take 1 tablet (5 mg total) by mouth every evening.  . Liraglutide -Weight Management (SAXENDA) 18 MG/3ML SOPN Inject 0.5 mLs (3 mg total) into the skin daily.  . SUMAtriptan (IMITREX) 50 MG tablet Take 1 tablet (50 mg total) by mouth every 2 (two) hours as needed for migraine.    . [DISCONTINUED] atorvastatin (LIPITOR) 40 MG tablet Take 1 tablet (40 mg total) by mouth daily at 6 PM.  . [DISCONTINUED] escitalopram (LEXAPRO) 10 MG tablet TAKE 1 TABLET(10 MG) BY MOUTH DAILY  . [DISCONTINUED] irbesartan (AVAPRO) 150 MG tablet TAKE 1 TABLET(150 MG) BY MOUTH DAILY   No facility-administered encounter medications on file as of 03/05/2020.    Allergies (verified) Contrast media [iodinated diagnostic agents]   History: Past Medical History:  Diagnosis Date  . Allergy   . Anxiety   . Depression   . DJD (degenerative joint disease)   . DJD (degenerative joint disease)   . Early menopause   . 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 GERD (gastroesophageal reflux disease)   . History of pyelonephritis 2009  . Hyperlipidemia   . Migraine   . Osteoporosis    Past Surgical History:  Procedure Laterality Date  . ABDOMINAL HYSTERECTOMY     due to fibroids  . hx of breast biopsy  1990   neg  . OOPHORECTOMY  1977   due to endometriosis  . s/p Right ectopic kidney   74 yrs old  . TONSILLECTOMY     Family History  Problem Relation Age of Onset  . Cancer Mother        Cancer  . Hyperlipidemia Mother   . Alcohol abuse  Mother   . Breast cancer Mother   . Hyperlipidemia Sister   . Cancer Cousin        breast cancer  . Diabetes Other        uncle  . Alcohol abuse Other        uncle  . Breast cancer Maternal Aunt   . Heart disease Neg Hx   . Stroke Neg Hx   . Hypertension Neg Hx   . Kidney disease Neg Hx    Social History   Socioeconomic History  . Marital status: Married    Spouse name: Not on file  . Number of children: 3  . Years of education: Not on file  . Highest education level: Not on file  Occupational History  . Not on file  Tobacco Use  . Smoking status: Never Smoker  . Smokeless tobacco: Never Used  Vaping Use  . Vaping Use: Never used  Substance and Sexual Activity  . Alcohol use:  Yes    Alcohol/week: 1.0 standard drink    Types: 1 Glasses of wine per week    Comment: wine once a month at most  . Drug use: No  . Sexual activity: Yes    Birth control/protection: Surgical  Other Topics Concern  . Not on file  Social History Narrative  . Not on file   Social Determinants of Health   Financial Resource Strain: Low Risk   . Difficulty of Paying Living Expenses: Not hard at all  Food Insecurity: No Food Insecurity  . Worried About Charity fundraiser in the Last Year: Never true  . Ran Out of Food in the Last Year: Never true  Transportation Needs: No Transportation Needs  . Lack of Transportation (Medical): No  . Lack of Transportation (Non-Medical): No  Physical Activity: Sufficiently Active  . Days of Exercise per Week: 5 days  . Minutes of Exercise per Session: 30 min  Stress: No Stress Concern Present  . Feeling of Stress : Not at all  Social Connections:   . Frequency of Communication with Friends and Family:   . Frequency of Social Gatherings with Friends and Family:   . Attends Religious Services:   . Active Member of Clubs or Organizations:   . Attends Archivist Meetings:   Marland Kitchen Marital Status:     Tobacco Counseling Counseling given: No   Clinical Intake:  Pre-visit preparation completed: Yes  Pain : No/denies pain Pain Score: 0-No pain     BMI - recorded: 28.01 Nutritional Status: BMI 25 -29 Overweight Nutritional Risks: None Diabetes: No  How often do you need to have someone help you when you read instructions, pamphlets, or other written materials from your doctor or pharmacy?: 1 - Never What is the last grade level you completed in school?: HS[employer], college courses  Diabetic? no  Interpreter Needed?: No  Information entered by :: Antoinne Spadaccini N. Suzie Vandam, LPN   Activities of Daily Living In your present state of health, do you have any difficulty performing the following activities: 03/05/2020  Hearing? N  Vision? N   Difficulty concentrating or making decisions? N  Walking or climbing stairs? N  Dressing or bathing? N  Doing errands, shopping? N  Preparing Food and eating ? N  Using the Toilet? N  In the past six months, have you accidently leaked urine? N  Do you have problems with loss of bowel control? N  Managing your Medications? N  Managing your Finances? N  Housekeeping  or managing your Housekeeping? N  Some recent data might be hidden    Patient Care Team: Janith Lima, MD as PCP - General  Indicate any recent Medical Services you may have received from other than [facility] providers in the past year (date may be approximate).     Assessment:   This is a routine wellness examination for Ashley Cruz.  Hearing/Vision screen No exam data present  Dietary issues and exercise activities discussed: Current Exercise Habits: Home exercise routine, Type of exercise: walking;Other - see comments (landscaping, gardening), Time (Minutes): 30, Frequency (Times/Week): 5, Weekly Exercise (Minutes/Week): 150, Intensity: Moderate, Exercise limited by: None identified  Goals    . Lose 10 pounds     Monitor diet and walk daily with my dog    . Patient Stated     Lose weight by watching what I eat and increasing my physical activity.     . Patient Stated     I want to lose weight by starting to walk on my property with my husband routinely.      Depression Screen PHQ 2/9 Scores 03/05/2020 03/05/2020 12/19/2019 01/16/2019 11/09/2018 11/07/2017 11/03/2016  PHQ - 2 Score 0 0 0 2 0 1 1  PHQ- 9 Score - - 0 3 - 3 4    Fall Risk Fall Risk  03/05/2020 01/16/2019 11/09/2018 11/07/2017 11/03/2016  Falls in the past year? 0 0 0 No No  Number falls in past yr: 0 0 - - -  Injury with Fall? 0 0 - - -  Risk for fall due to : No Fall Risks - - - -  Follow up Falls evaluation completed;Education provided Falls evaluation completed - - -    Any stairs in or around the home? No  If so, are there any without handrails?  No  Home free of loose throw rugs in walkways, pet beds, electrical cords, etc? Yes  Adequate lighting in your home to reduce risk of falls? Yes   ASSISTIVE DEVICES UTILIZED TO PREVENT FALLS:  Life alert? Yes  Use of a cane, walker or w/c? No  Grab bars in the bathroom? No  Shower chair or bench in shower? Yes  Elevated toilet seat or a handicapped toilet? No   TIMED UP AND GO:  Was the test performed? No .  Length of time to ambulate 10 feet: 0 sec.   Gait steady and fast without use of assistive device  Cognitive Function: MMSE - Mini Mental State Exam 11/07/2017  Orientation to time 5  Orientation to Place 5  Registration 3  Attention/ Calculation 5  Recall 2  Language- name 2 objects 2  Language- repeat 1  Language- follow 3 step command 3  Language- read & follow direction 1  Write a sentence 1  Copy design 1  Total score 29     6CIT Screen 03/05/2020  What Year? 0 points  What month? 0 points  What time? 0 points  Count back from 20 0 points  Months in reverse 0 points  Repeat phrase 0 points  Total Score 0    Immunizations Immunization History  Administered Date(s) Administered  . Influenza, High Dose Seasonal PF 06/04/2013, 04/14/2016, 05/17/2017, 05/01/2019  . PFIZER SARS-COV-2 Vaccination 08/11/2019, 09/01/2019  . Pneumococcal Conjugate-13 06/04/2013  . Pneumococcal Polysaccharide-23 05/17/2017  . Tdap 06/04/2013    TDAP status: Up to date Flu Vaccine status: Up to date Pneumococcal vaccine status: Up to date Covid-19 vaccine status: Completed vaccines  Qualifies for Shingles  Vaccine? Yes   Zostavax completed Yes   Shingrix Completed?: No.    Education has been provided regarding the importance of this vaccine. Patient has been advised to call insurance company to determine out of pocket expense if they have not yet received this vaccine. Advised may also receive vaccine at local pharmacy or Health Dept. Verbalized acceptance and  understanding.  Screening Tests Health Maintenance  Topic Date Due  . Fecal DNA (Cologuard)  11/17/2019  . INFLUENZA VACCINE  02/17/2020  . MAMMOGRAM  03/07/2021  . TETANUS/TDAP  06/05/2023  . DEXA SCAN  Completed  . COVID-19 Vaccine  Completed  . Hepatitis C Screening  Completed  . PNA vac Low Risk Adult  Completed    Health Maintenance  Health Maintenance Due  Topic Date Due  . Fecal DNA (Cologuard)  11/17/2019  . INFLUENZA VACCINE  02/17/2020    Colorectal cancer screening: Completed 11/16/2016. Repeat every 3 years Mammogram status: Completed 03/08/2019. Repeat every year Bone Density status: Completed 05/24/2016. Results reflect: Bone density results: OSTEOPOROSIS. Repeat every 2-3 years.  Lung Cancer Screening: (Low Dose CT Chest recommended if Age 37-80 years, 30 pack-year currently smoking OR have quit w/in 15years.) does not qualify.   Lung Cancer Screening Referral: no  Additional Screening:  Hepatitis C Screening: does qualify; Completed yes  Vision Screening: Recommended annual ophthalmology exams for early detection of glaucoma and other disorders of the eye. Is the patient up to date with their annual eye exam?  Yes  Who is the provider or what is the name of the office in which the patient attends annual eye exams? Sherlynn Stalls, MD If pt is not established with a provider, would they like to be referred to a provider to establish care? No .   Dental Screening: Recommended annual dental exams for proper oral hygiene  Community Resource Referral / Chronic Care Management: CRR required this visit?  No   CCM required this visit?  No      Plan:     I have personally reviewed and noted the following in the patient's chart:   . Medical and social history . Use of alcohol, tobacco or illicit drugs  . Current medications and supplements . Functional ability and status . Nutritional status . Physical activity . Advanced directives . List of other  physicians . Hospitalizations, surgeries, and ER visits in previous 12 months . Vitals . Screenings to include cognitive, depression, and falls . Referrals and appointments  In addition, I have reviewed and discussed with patient certain preventive protocols, quality metrics, and best practice recommendations. A written personalized care plan for preventive services as well as general preventive health recommendations were provided to patient.     Sheral Flow, LPN   [id number]   Nurse Notes: n/a

## 2020-03-05 NOTE — Progress Notes (Signed)
Subjective:  Patient ID: Ashley Cruz, female    DOB: 1945-08-25  Age: 74 y.o. MRN: [id number]  CC: Urinary Tract Infection  This visit occurred during the SARS-CoV-2 public health emergency.  Safety protocols were in place, including screening questions prior to the visit, additional usage of staff PPE, and extensive cleaning of exam room while observing appropriate contact time as indicated for disinfecting solutions.    HPI Ashley Cruz presents for a UTI - She complains of a 1 week history of dysuria, bladder pressure, urgency, and foul urinary odor.  Outpatient Medications Prior to Visit  Medication Sig Dispense Refill  . atorvastatin (LIPITOR) 40 MG tablet TAKE 1 TABLET(40 MG) BY MOUTH DAILY AT 6 PM 90 tablet 1  . clobetasol ointment (TEMOVATE) 5.85 % Apply 1 application topically 2 (two) times daily. 60 g 1  . escitalopram (LEXAPRO) 10 MG tablet TAKE 1 TABLET(10 MG) BY MOUTH DAILY 90 tablet 1  . indapamide (LOZOL) 1.25 MG tablet Take 1.25 mg by mouth daily.    . Insulin Pen Needle (PEN NEEDLES) 32G X 4 MM MISC 1 each by Does not apply route daily. Use to inject insulin once daily. 100 each 0  . irbesartan (AVAPRO) 150 MG tablet TAKE 1 TABLET(150 MG) BY MOUTH DAILY 90 tablet 1  . levocetirizine (XYZAL) 5 MG tablet Take 1 tablet (5 mg total) by mouth every evening. 90 tablet 0  . Liraglutide -Weight Management (SAXENDA) 18 MG/3ML SOPN Inject 0.5 mLs (3 mg total) into the skin daily. 5 pen 5  . SUMAtriptan (IMITREX) 50 MG tablet Take 1 tablet (50 mg total) by mouth every 2 (two) hours as needed for migraine. 12 tablet 4   No facility-administered medications prior to visit.    ROS Review of Systems  Constitutional: Negative for chills, fatigue and fever.  HENT: Negative.   Eyes: Negative for visual disturbance.  Respiratory: Negative for chest tightness, shortness of breath and wheezing.   Cardiovascular: Negative for chest pain, palpitations and leg swelling.    Gastrointestinal: Negative for abdominal pain, constipation, diarrhea, nausea and vomiting.  Endocrine: Negative.   Genitourinary: Positive for dysuria and urgency. Negative for decreased urine volume, difficulty urinating, flank pain, frequency and hematuria.  Musculoskeletal: Negative.  Negative for arthralgias and myalgias.  Skin: Negative.   Neurological: Negative.  Negative for dizziness, weakness and headaches.  Hematological: Negative for adenopathy. Does not bruise/bleed easily.  Psychiatric/Behavioral: Negative.     Objective:  BP 122/80 (BP Location: Left Arm, Patient Position: Sitting, Cuff Size: Normal)   Pulse 68   Temp 98.1 F (36.7 C) (Oral)   Resp 16   Ht 5\' 4"  (1.626 m)   Wt 163 lb (73.9 kg)   SpO2 97%   BMI 27.98 kg/m   BP Readings from Last 3 Encounters:  03/05/20 122/80  03/05/20 122/80  12/19/19 138/82    Wt Readings from Last 3 Encounters:  03/05/20 163 lb (73.9 kg)  03/05/20 163 lb 3.2 oz (74 kg)  12/19/19 159 lb (72.1 kg)    H EENT: Normal Neck:  Supple Lungs: Clear to auscultation bilaterally. Cardiovascular exam: Regular rate with no murmur rub or gallop. Abdomen: flat active bowel sounds nontender nondistended.  No CVA tenderness. Skin warm and dry with no lesions. No extremity edema.  Lab Results  Component Value Date   WBC 7.5 12/19/2019   HGB 13.4 12/19/2019   HCT 40.9 12/19/2019   PLT 269.0 12/19/2019   GLUCOSE 88 12/19/2019  CHOL 167 12/19/2019   TRIG 114.0 12/19/2019   HDL 57.10 12/19/2019   LDLDIRECT 232.0 04/14/2016   LDLCALC 87 12/19/2019   ALT 19 12/19/2019   AST 23 12/19/2019   NA 139 12/19/2019   K 4.0 12/19/2019   CL 105 12/19/2019   CREATININE 0.99 12/19/2019   BUN 21 12/19/2019   CO2 29 12/19/2019   TSH 2.20 12/19/2019   INR 1.0 RATIO 04/04/2008   HGBA1C  11/26/2008    5.7 (NOTE) The ADA recommends the following therapeutic goal for glycemic control related to Hgb A1c measurement: Goal of therapy: <6.5 Hgb  A1c  Reference: American Diabetes Association: Clinical Practice Recommendations 2010, Diabetes Care, 2010, 33: (Suppl  1).    MM 3D SCREEN BREAST BILATERAL  Result Date: 03/08/2019 CLINICAL DATA:  Screening. EXAM: DIGITAL SCREENING BILATERAL MAMMOGRAM WITH TOMO AND CAD COMPARISON:  Previous exam(s). ACR Breast Density Category b: There are scattered areas of fibroglandular density. FINDINGS: There are no findings suspicious for malignancy. Images were processed with CAD. IMPRESSION: No mammographic evidence of malignancy. A result letter of this screening mammogram will be mailed directly to the patient. RECOMMENDATION: Screening mammogram in one year. (Code:[id number]-B-01Y) BI-RADS CATEGORY  1: Negative. Electronically Signed   By: Lovey Newcomer M.D.   On: 03/08/2019 12:10   03/05/2020 yellow           Clarity, UA  cloudy         Glucose, UA Negative Negative         Bilirubin, UA  Negative         Ketones, UA  Negative         Spec Grav, UA 1.010 - 1.025 1.025         Blood, UA  Negative         pH, UA 5.0 - 8.0 5.5         Protein, UA Negative PositiveAbnormal         Comment: 15 mg/dL  Urobilinogen, UA 0.2 or 1.0 E.U./dL 0.2  0.2 R  0.2 R  0.2 R  0.2 R  0.2 R  0.2 R   Nitrite, UA  POSITIVE         Leukocytes, UA Negative Small (1+)Abnormal     MODERATEAbnormal R  MODERATE  NEGATIVE   Comment: 15 Leu/uL  Resulting Agency            trites   Assessment & Plan:   Ashley was seen today for urinary tract infection.  Diagnoses and all orders for this visit:  Dysuria -     POCT Urinalysis Dipstick (Automated) -     CULTURE, URINE COMPREHENSIVE; Future -     CULTURE, URINE COMPREHENSIVE  Colon cancer screening -     Cologuard  Acute cystitis without hematuria- Her UA is positive for white cells and nitrites.  The culture is pending.  Will empirically treat her with Bactrim DS. -     sulfamethoxazole-trimethoprim (BACTRIM DS) 800-160 MG tablet; Take 1 tablet by mouth 2 (two)  times daily for 5 days.   I am having Ashley Cruz start on sulfamethoxazole-trimethoprim. I am also having her maintain her SUMAtriptan, levocetirizine, clobetasol ointment, atorvastatin, Saxenda, Pen Needles, indapamide, escitalopram, and irbesartan.  Meds ordered this encounter  Medications  . sulfamethoxazole-trimethoprim (BACTRIM DS) 800-160 MG tablet    Sig: Take 1 tablet by mouth 2 (two) times daily for 5 days.    Dispense:  10 tablet    Refill:  0     Follow-up: Return in about 3 weeks (around 03/26/2020).  Scarlette Calico, MD

## 2020-03-05 NOTE — Patient Instructions (Addendum)
Ashley Cruz , Thank you for taking time to come for your Medicare Wellness Visit. I appreciate your ongoing commitment to your health goals. Please review the following plan we discussed and let me know if I can assist you in the future.   Screening recommendations/referrals: Colonoscopy: 11/16/2016 Mammogram: 03/08/2019 Bone Density: 05/24/2016 Recommended yearly ophthalmology/optometry visit for glaucoma screening and checkup Recommended yearly dental visit for hygiene and checkup  Vaccinations: Influenza vaccine: 05/01/2019 Pneumococcal vaccine: completed Tdap vaccine: 06/04/2013 Shingles vaccine: never done   Covid-19: completed  Advanced directives: Please bring a copy of your health care power of attorney and living will to the office at your convenience.  Conditions/risks identified: Yes; Please continue to do your personal lifestyle choices by: daily care of teeth and gums, regular physical activity (goal should be 5 days a week for 30 minutes), eat a healthy diet, avoid tobacco and drug use, limiting any alcohol intake, taking a low-dose aspirin (if not allergic or have been advised by your provider otherwise) and taking vitamins and minerals as recommended by your provider. Continue doing brain stimulating activities (puzzles, reading, adult coloring books, staying active) to keep memory sharp. Continue to eat heart healthy diet (full of fruits, vegetables, whole grains, lean protein, water--limit salt, fat, and sugar intake) and increase physical activity as tolerated.  Next appointment: Please schedule your next Medicare Wellness Visit with your Nurse Health Advisor in 1 year.  Preventive Care 30 Years and Older, Female Preventive care refers to lifestyle choices and visits with your health care provider that can promote health and wellness. What does preventive care include?  A yearly physical exam. This is also called an annual well check.  Dental exams once or twice a  year.  Routine eye exams. Ask your health care provider how often you should have your eyes checked.  Personal lifestyle choices, including:  Daily care of your teeth and gums.  Regular physical activity.  Eating a healthy diet.  Avoiding tobacco and drug use.  Limiting alcohol use.  Practicing safe sex.  Taking low-dose aspirin every day.  Taking vitamin and mineral supplements as recommended by your health care provider. What happens during an annual well check? The services and screenings done by your health care provider during your annual well check will depend on your age, overall health, lifestyle risk factors, and family history of disease. Counseling  Your health care provider may ask you questions about your:  Alcohol use.  Tobacco use.  Drug use.  Emotional well-being.  Home and relationship well-being.  Sexual activity.  Eating habits.  History of falls.  Memory and ability to understand (cognition).  Work and work Statistician.  Reproductive health. Screening  You may have the following tests or measurements:  Height, weight, and BMI.  Blood pressure.  Lipid and cholesterol levels. These may be checked every 5 years, or more frequently if you are over 44 years old.  Skin check.  Lung cancer screening. You may have this screening every year starting at age 16 if you have a 30-pack-year history of smoking and currently smoke or have quit within the past 15 years.  Fecal occult blood test (FOBT) of the stool. You may have this test every year starting at age 64.  Flexible sigmoidoscopy or colonoscopy. You may have a sigmoidoscopy every 5 years or a colonoscopy every 10 years starting at age 22.  Hepatitis C blood test.  Hepatitis B blood test.  Sexually transmitted disease (STD) testing.  Diabetes screening. This  is done by checking your blood sugar (glucose) after you have not eaten for a while (fasting). You may have this done every 1-3  years.  Bone density scan. This is done to screen for osteoporosis. You may have this done starting at age 48.  Mammogram. This may be done every 1-2 years. Talk to your health care provider about how often you should have regular mammograms. Talk with your health care provider about your test results, treatment options, and if necessary, the need for more tests. Vaccines  Your health care provider may recommend certain vaccines, such as:  Influenza vaccine. This is recommended every year.  Tetanus, diphtheria, and acellular pertussis (Tdap, Td) vaccine. You may need a Td booster every 10 years.  Zoster vaccine. You may need this after age 67.  Pneumococcal 13-valent conjugate (PCV13) vaccine. One dose is recommended after age 2.  Pneumococcal polysaccharide (PPSV23) vaccine. One dose is recommended after age 59. Talk to your health care provider about which screenings and vaccines you need and how often you need them. This information is not intended to replace advice given to you by your health care provider. Make sure you discuss any questions you have with your health care provider. Document Released: 08/01/2015 Document Revised: 03/24/2016 Document Reviewed: 05/06/2015 Elsevier Interactive Patient Education  2017 [employer] Prevention in the Home Falls can cause injuries. They can happen to people of all ages. There are many things you can do to make your home safe and to help prevent falls. What can I do on the outside of my home?  Regularly fix the edges of walkways and driveways and fix any cracks.  Remove anything that might make you trip as you walk through a door, such as a raised step or threshold.  Trim any bushes or trees on the path to your home.  Use bright outdoor lighting.  Clear any walking paths of anything that might make someone trip, such as rocks or tools.  Regularly check to see if handrails are loose or broken. Make sure that both sides of any  steps have handrails.  Any raised decks and porches should have guardrails on the edges.  Have any leaves, snow, or ice cleared regularly.  Use sand or salt on walking paths during winter.  Clean up any spills in your garage right away. This includes oil or grease spills. What can I do in the bathroom?  Use night lights.  Install grab bars by the toilet and in the tub and shower. Do not use towel bars as grab bars.  Use non-skid mats or decals in the tub or shower.  If you need to sit down in the shower, use a plastic, non-slip stool.  Keep the floor dry. Clean up any water that spills on the floor as soon as it happens.  Remove soap buildup in the tub or shower regularly.  Attach bath mats securely with double-sided non-slip rug tape.  Do not have throw rugs and other things on the floor that can make you trip. What can I do in the bedroom?  Use night lights.  Make sure that you have a light by your bed that is easy to reach.  Do not use any sheets or blankets that are too big for your bed. They should not hang down onto the floor.  Have a firm chair that has side arms. You can use this for support while you get dressed.  Do not have throw rugs and other  things on the floor that can make you trip. What can I do in the kitchen?  Clean up any spills right away.  Avoid walking on wet floors.  Keep items that you use a lot in easy-to-reach places.  If you need to reach something above you, use a strong step stool that has a grab bar.  Keep electrical cords out of the way.  Do not use floor polish or wax that makes floors slippery. If you must use wax, use non-skid floor wax.  Do not have throw rugs and other things on the floor that can make you trip. What can I do with my stairs?  Do not leave any items on the stairs.  Make sure that there are handrails on both sides of the stairs and use them. Fix handrails that are broken or loose. Make sure that handrails are  as long as the stairways.  Check any carpeting to make sure that it is firmly attached to the stairs. Fix any carpet that is loose or worn.  Avoid having throw rugs at the top or bottom of the stairs. If you do have throw rugs, attach them to the floor with carpet tape.  Make sure that you have a light switch at the top of the stairs and the bottom of the stairs. If you do not have them, ask someone to add them for you. What else can I do to help prevent falls?  Wear shoes that:  Do not have high heels.  Have rubber bottoms.  Are comfortable and fit you well.  Are closed at the toe. Do not wear sandals.  If you use a stepladder:  Make sure that it is fully opened. Do not climb a closed stepladder.  Make sure that both sides of the stepladder are locked into place.  Ask someone to hold it for you, if possible.  Clearly mark and make sure that you can see:  Any grab bars or handrails.  First and last steps.  Where the edge of each step is.  Use tools that help you move around (mobility aids) if they are needed. These include:  Canes.  Walkers.  Scooters.  Crutches.  Turn on the lights when you go into a dark area. Replace any light bulbs as soon as they burn out.  Set up your furniture so you have a clear path. Avoid moving your furniture around.  If any of your floors are uneven, fix them.  If there are any pets around you, be aware of where they are.  Review your medicines with your doctor. Some medicines can make you feel dizzy. This can increase your chance of falling. Ask your doctor what other things that you can do to help prevent falls. This information is not intended to replace advice given to you by your health care provider. Make sure you discuss any questions you have with your health care provider. Document Released: 05/01/2009 Document Revised: 12/11/2015 Document Reviewed: 08/09/2014 Elsevier Interactive Patient Education  2017 [employer].

## 2020-03-06 ENCOUNTER — Encounter: Payer: Self-pay | Admitting: Internal Medicine

## 2020-03-07 LAB — CULTURE, URINE COMPREHENSIVE

## 2020-03-08 ENCOUNTER — Encounter: Payer: Self-pay | Admitting: Internal Medicine

## 2020-03-11 DIAGNOSIS — D485 Neoplasm of uncertain behavior of skin: Secondary | ICD-10-CM | POA: Diagnosis not present

## 2020-03-11 DIAGNOSIS — D225 Melanocytic nevi of trunk: Secondary | ICD-10-CM | POA: Diagnosis not present

## 2020-03-11 DIAGNOSIS — D2239 Melanocytic nevi of other parts of face: Secondary | ICD-10-CM | POA: Diagnosis not present

## 2020-03-13 DIAGNOSIS — Z1211 Encounter for screening for malignant neoplasm of colon: Secondary | ICD-10-CM | POA: Diagnosis not present

## 2020-03-13 DIAGNOSIS — Z1212 Encounter for screening for malignant neoplasm of rectum: Secondary | ICD-10-CM | POA: Diagnosis not present

## 2020-03-20 LAB — COLOGUARD
COLOGUARD: NEGATIVE
Cologuard: NEGATIVE

## 2020-03-20 LAB — EXTERNAL GENERIC LAB PROCEDURE: COLOGUARD: NEGATIVE

## 2020-03-26 ENCOUNTER — Encounter: Payer: Self-pay | Admitting: Internal Medicine

## 2020-04-01 ENCOUNTER — Encounter: Payer: Self-pay | Admitting: Internal Medicine

## 2020-04-01 NOTE — Progress Notes (Signed)
Outside notes received. Information abstracted. Notes sent to scan.  

## 2020-04-16 ENCOUNTER — Encounter: Payer: Self-pay | Admitting: Internal Medicine

## 2020-04-28 ENCOUNTER — Other Ambulatory Visit: Payer: Self-pay | Admitting: Internal Medicine

## 2020-04-28 DIAGNOSIS — E785 Hyperlipidemia, unspecified: Secondary | ICD-10-CM

## 2020-05-07 ENCOUNTER — Encounter: Payer: Self-pay | Admitting: Internal Medicine

## 2020-07-22 ENCOUNTER — Other Ambulatory Visit: Payer: Self-pay | Admitting: Internal Medicine

## 2020-07-22 DIAGNOSIS — F418 Other specified anxiety disorders: Secondary | ICD-10-CM

## 2020-08-23 ENCOUNTER — Other Ambulatory Visit: Payer: Self-pay | Admitting: Internal Medicine

## 2020-08-23 DIAGNOSIS — N183 Chronic kidney disease, stage 3 unspecified: Secondary | ICD-10-CM

## 2020-08-23 DIAGNOSIS — I10 Essential (primary) hypertension: Secondary | ICD-10-CM

## 2020-09-10 ENCOUNTER — Telehealth: Payer: Self-pay | Admitting: Internal Medicine

## 2020-09-10 NOTE — Progress Notes (Signed)
°  Chronic Care Management   Outreach Note  09/10/2020 Name: Ashley Cruz MRN: [id number] DOB: [birth year]  Referred by: Janith Lima, MD Reason for referral : No chief complaint on file.   An unsuccessful telephone outreach was attempted today. The patient was referred to the pharmacist for assistance with care management and care coordination.   Follow Up Plan:   Carley Perdue [employer] Scheduler

## 2020-10-01 ENCOUNTER — Telehealth: Payer: Self-pay | Admitting: Internal Medicine

## 2020-10-01 NOTE — Progress Notes (Signed)
  Chronic Care Management   Outreach Note  10/01/2020 Name: Ashley Cruz MRN: [id number] DOB: [birth year]  Referred by: Janith Lima, MD Reason for referral : No chief complaint on file.   A second unsuccessful telephone outreach was attempted today. The patient was referred to pharmacist for assistance with care management and care coordination.  Follow Up Plan:   Carley Perdue [employer] Scheduler

## 2020-10-16 ENCOUNTER — Other Ambulatory Visit: Payer: Self-pay | Admitting: Internal Medicine

## 2020-10-16 DIAGNOSIS — E785 Hyperlipidemia, unspecified: Secondary | ICD-10-CM

## 2020-10-21 ENCOUNTER — Telehealth: Payer: Self-pay | Admitting: Internal Medicine

## 2020-10-21 NOTE — Progress Notes (Signed)
  Chronic Care Management   Outreach Note  10/21/2020 Name: Ashley Cruz MRN: [id number] DOB: Dec 01, 1945  Referred by: Janith Lima, MD Reason for referral : No chief complaint on file.   Third unsuccessful telephone outreach was attempted today. The patient was referred to the pharmacist for assistance with care management and care coordination.   Follow Up Plan:   Carley Perdue [employer] Scheduler

## 2020-10-27 DIAGNOSIS — D2230 Melanocytic nevi of unspecified part of face: Secondary | ICD-10-CM | POA: Diagnosis not present

## 2020-10-27 DIAGNOSIS — L821 Other seborrheic keratosis: Secondary | ICD-10-CM | POA: Diagnosis not present

## 2020-10-27 DIAGNOSIS — L570 Actinic keratosis: Secondary | ICD-10-CM | POA: Diagnosis not present

## 2020-10-27 DIAGNOSIS — L720 Epidermal cyst: Secondary | ICD-10-CM | POA: Diagnosis not present

## 2020-10-27 DIAGNOSIS — D485 Neoplasm of uncertain behavior of skin: Secondary | ICD-10-CM | POA: Diagnosis not present

## 2020-10-27 DIAGNOSIS — B079 Viral wart, unspecified: Secondary | ICD-10-CM | POA: Diagnosis not present

## 2021-01-14 ENCOUNTER — Other Ambulatory Visit: Payer: Self-pay | Admitting: Internal Medicine

## 2021-01-14 DIAGNOSIS — F418 Other specified anxiety disorders: Secondary | ICD-10-CM

## 2021-02-01 ENCOUNTER — Other Ambulatory Visit: Payer: Self-pay | Admitting: Internal Medicine

## 2021-02-01 DIAGNOSIS — F418 Other specified anxiety disorders: Secondary | ICD-10-CM

## 2021-02-16 ENCOUNTER — Telehealth: Payer: Self-pay | Admitting: Internal Medicine

## 2021-02-16 NOTE — Telephone Encounter (Signed)
   Patient requesting refill for Imitrex Declined appointment  [address] [facility]

## 2021-02-17 NOTE — Telephone Encounter (Signed)
    Left message [email or web address] to call for appointment

## 2021-02-23 ENCOUNTER — Other Ambulatory Visit: Payer: Self-pay | Admitting: Internal Medicine

## 2021-02-23 DIAGNOSIS — I10 Essential (primary) hypertension: Secondary | ICD-10-CM

## 2021-02-23 DIAGNOSIS — N183 Chronic kidney disease, stage 3 unspecified: Secondary | ICD-10-CM

## 2021-03-02 ENCOUNTER — Other Ambulatory Visit: Payer: Self-pay

## 2021-03-02 ENCOUNTER — Ambulatory Visit (INDEPENDENT_AMBULATORY_CARE_PROVIDER_SITE_OTHER): Payer: Medicare Other | Admitting: Internal Medicine

## 2021-03-02 ENCOUNTER — Encounter: Payer: Self-pay | Admitting: Internal Medicine

## 2021-03-02 VITALS — BP 140/76 | HR 59 | Temp 98.4°F | Resp 16 | Ht 64.0 in | Wt 164.0 lb

## 2021-03-02 DIAGNOSIS — N183 Chronic kidney disease, stage 3 unspecified: Secondary | ICD-10-CM

## 2021-03-02 DIAGNOSIS — Z0000 Encounter for general adult medical examination without abnormal findings: Secondary | ICD-10-CM | POA: Diagnosis not present

## 2021-03-02 DIAGNOSIS — R0789 Other chest pain: Secondary | ICD-10-CM | POA: Insufficient documentation

## 2021-03-02 DIAGNOSIS — I10 Essential (primary) hypertension: Secondary | ICD-10-CM

## 2021-03-02 DIAGNOSIS — E785 Hyperlipidemia, unspecified: Secondary | ICD-10-CM

## 2021-03-02 DIAGNOSIS — G43409 Hemiplegic migraine, not intractable, without status migrainosus: Secondary | ICD-10-CM | POA: Insufficient documentation

## 2021-03-02 DIAGNOSIS — R0600 Dyspnea, unspecified: Secondary | ICD-10-CM | POA: Diagnosis not present

## 2021-03-02 DIAGNOSIS — Z1231 Encounter for screening mammogram for malignant neoplasm of breast: Secondary | ICD-10-CM

## 2021-03-02 DIAGNOSIS — N1831 Chronic kidney disease, stage 3a: Secondary | ICD-10-CM | POA: Diagnosis not present

## 2021-03-02 DIAGNOSIS — R0609 Other forms of dyspnea: Secondary | ICD-10-CM | POA: Insufficient documentation

## 2021-03-02 LAB — URINALYSIS, ROUTINE W REFLEX MICROSCOPIC
Bilirubin Urine: NEGATIVE
Hgb urine dipstick: NEGATIVE
Ketones, ur: NEGATIVE
Nitrite: NEGATIVE
Specific Gravity, Urine: 1.025 (ref 1.000–1.030)
Total Protein, Urine: NEGATIVE
Urine Glucose: NEGATIVE
Urobilinogen, UA: 0.2 (ref 0.0–1.0)
pH: 5.5 (ref 5.0–8.0)

## 2021-03-02 LAB — CBC WITH DIFFERENTIAL/PLATELET
Basophils Absolute: 0 10*3/uL (ref 0.0–0.1)
Basophils Relative: 0.7 % (ref 0.0–3.0)
Eosinophils Absolute: 0.2 10*3/uL (ref 0.0–0.7)
Eosinophils Relative: 2.9 % (ref 0.0–5.0)
HCT: 42.9 % (ref 36.0–46.0)
Hemoglobin: 14 g/dL (ref 12.0–15.0)
Lymphocytes Relative: 47.9 % — ABNORMAL HIGH (ref 12.0–46.0)
Lymphs Abs: 3 10*3/uL (ref 0.7–4.0)
MCHC: 32.6 g/dL (ref 30.0–36.0)
MCV: 94.6 fl (ref 78.0–100.0)
Monocytes Absolute: 0.6 10*3/uL (ref 0.1–1.0)
Monocytes Relative: 9.4 % (ref 3.0–12.0)
Neutro Abs: 2.5 10*3/uL (ref 1.4–7.7)
Neutrophils Relative %: 39.1 % — ABNORMAL LOW (ref 43.0–77.0)
Platelets: 264 10*3/uL (ref 150.0–400.0)
RBC: 4.54 Mil/uL (ref 3.87–5.11)
RDW: 13.8 % (ref 11.5–15.5)
WBC: 6.3 10*3/uL (ref 4.0–10.5)

## 2021-03-02 LAB — HEPATIC FUNCTION PANEL
ALT: 23 U/L (ref 0–35)
AST: 18 U/L (ref 0–37)
Albumin: 4.4 g/dL (ref 3.5–5.2)
Alkaline Phosphatase: 73 U/L (ref 39–117)
Bilirubin, Direct: 0.1 mg/dL (ref 0.0–0.3)
Total Bilirubin: 0.6 mg/dL (ref 0.2–1.2)
Total Protein: 7.1 g/dL (ref 6.0–8.3)

## 2021-03-02 LAB — BASIC METABOLIC PANEL
BUN: 19 mg/dL (ref 6–23)
CO2: 26 mEq/L (ref 19–32)
Calcium: 9.5 mg/dL (ref 8.4–10.5)
Chloride: 106 mEq/L (ref 96–112)
Creatinine, Ser: 0.9 mg/dL (ref 0.40–1.20)
GFR: 63.23 mL/min (ref >60.00)
Glucose, Bld: 77 mg/dL (ref 70–99)
Potassium: 4.1 mEq/L (ref 3.5–5.1)
Sodium: 143 mEq/L (ref 135–145)

## 2021-03-02 LAB — LIPID PANEL
Cholesterol: 173 mg/dL (ref 0–200)
HDL: 61.5 mg/dL (ref >39.00)
LDL Cholesterol: 90 mg/dL (ref 0–99)
NonHDL: 111.03
Total CHOL/HDL Ratio: 3
Triglycerides: 106 mg/dL (ref 0.0–149.0)
VLDL: 21.2 mg/dL (ref 0.0–40.0)

## 2021-03-02 LAB — TROPONIN I (HIGH SENSITIVITY): High Sens Troponin I: 7 ng/L (ref 2–17)

## 2021-03-02 LAB — BRAIN NATRIURETIC PEPTIDE: Pro B Natriuretic peptide (BNP): 64 pg/mL (ref 0.0–100.0)

## 2021-03-02 LAB — TSH: TSH: 2.19 u[IU]/mL (ref 0.35–5.50)

## 2021-03-02 MED ORDER — IRBESARTAN 150 MG PO TABS: 150.0000 mg | ORAL_TABLET | Freq: Every day | ORAL | 1 refills | Status: DC

## 2021-03-02 MED ORDER — ATORVASTATIN CALCIUM 40 MG PO TABS: ORAL_TABLET | ORAL | 1 refills | Status: DC

## 2021-03-02 MED ORDER — SUMATRIPTAN SUCCINATE 50 MG PO TABS: 50.0000 mg | ORAL_TABLET | ORAL | 4 refills | Status: DC | PRN

## 2021-03-02 NOTE — Progress Notes (Signed)
Subjective:  Patient ID: Ashley Cruz, female    DOB: 02-01-1946  Age: 75 y.o. MRN: [id number]  CC: Annual Exam, Hypertension, and Hyperlipidemia  This visit occurred during the SARS-CoV-2 public health emergency.  Safety protocols were in place, including screening questions prior to the visit, additional usage of staff PPE, and extensive cleaning of exam room while observing appropriate contact time as indicated for disinfecting solutions.    HPI Ashley Cruz presents for a CPX and f/up -   She indicates she is not taking her heart medications.  She is not sure if this is the statin or the ARB or both.  She has a chronic sensation of chest pressure and dyspnea on exertion.  She denies headache, blurred vision, diaphoresis, dizziness, lightheadedness, or edema.  Outpatient Medications Prior to Visit  Medication Sig Dispense Refill   clobetasol ointment (TEMOVATE) [id number] % Apply 1 application topically 2 (two) times daily. 60 g 1   escitalopram (LEXAPRO) 10 MG tablet TAKE 1 TABLET(10 MG) BY MOUTH DAILY 90 tablet 0   Insulin Pen Needle (PEN NEEDLES) 32G X 4 MM MISC 1 each by Does not apply route daily. Use to inject insulin once daily. 100 each 0   levocetirizine (XYZAL) 5 MG tablet Take 1 tablet (5 mg total) by mouth every evening. 90 tablet 0   atorvastatin (LIPITOR) 40 MG tablet TAKE 1 TABLET(40 MG) BY MOUTH DAILY AT 6 PM 90 tablet 1   indapamide (LOZOL) 1.25 MG tablet Take 1.25 mg by mouth daily.     irbesartan (AVAPRO) 150 MG tablet TAKE 1 TABLET(150 MG) BY MOUTH DAILY 90 tablet 1   Liraglutide -Weight Management (SAXENDA) 18 MG/3ML SOPN Inject 0.5 mLs (3 mg total) into the skin daily. 5 pen 5   SUMAtriptan (IMITREX) 50 MG tablet Take 1 tablet (50 mg total) by mouth every 2 (two) hours as needed for migraine. 12 tablet 4   No facility-administered medications prior to visit.    ROS Review of Systems  Constitutional:  Negative for diaphoresis, fatigue and fever.  HENT:  Negative.  Negative for sore throat and trouble swallowing.   Respiratory:  Positive for shortness of breath. Negative for cough, chest tightness and wheezing.   Cardiovascular:  Positive for chest pain. Negative for palpitations and leg swelling.  Gastrointestinal: Negative.  Negative for abdominal pain, diarrhea, nausea and vomiting.  Genitourinary: Negative.  Negative for difficulty urinating, dysuria and hematuria.  Musculoskeletal: Negative.  Negative for arthralgias and myalgias.  Skin: Negative.   Neurological:  Negative for dizziness, weakness, light-headedness and numbness.  Hematological:  Negative for adenopathy. Does not bruise/bleed easily.  Psychiatric/Behavioral: Negative.     Objective:  BP 140/76 (BP Location: Left Arm, Patient Position: Sitting, Cuff Size: Large)   Pulse (!) 59   Temp 98.4 F (36.9 C) (Oral)   Resp 16   Ht '5\' 4"'$  (1.626 m)   Wt 164 lb (74.4 kg)   SpO2 97%   BMI 28.15 kg/m   BP Readings from Last 3 Encounters:  03/02/21 140/76  03/05/20 122/80  03/05/20 122/80    Wt Readings from Last 3 Encounters:  03/02/21 164 lb (74.4 kg)  03/05/20 163 lb (73.9 kg)  03/05/20 163 lb 3.2 oz (74 kg)    Physical Exam Vitals reviewed.  HENT:     Nose: Nose normal.     Mouth/Throat:     Mouth: Mucous membranes are moist.  Eyes:     Conjunctiva/sclera: Conjunctivae normal.  Cardiovascular:  Rate and Rhythm: Regular rhythm. Bradycardia present.     Heart sounds: Normal heart sounds, S1 normal and S2 normal.    No gallop.     Comments: EKG- Sinus bradycardia, 57 bpm No LVH or Q waves Pulmonary:     Breath sounds: No stridor. No wheezing, rhonchi or rales.  Abdominal:     General: Abdomen is flat.     Palpations: There is no mass.     Tenderness: There is no abdominal tenderness. There is no guarding or rebound.     Hernia: No hernia is present.  Musculoskeletal:        General: No swelling.     Cervical back: Neck supple.     Right lower leg:  No edema.     Left lower leg: No edema.  Skin:    General: Skin is warm.     Coloration: Skin is not pale.     Findings: No lesion.  Neurological:     General: No focal deficit present.     Mental Status: She is alert. Mental status is at baseline.  Psychiatric:        Mood and Affect: Mood normal.        Behavior: Behavior normal.    Lab Results  Component Value Date   WBC 6.3 03/02/2021   HGB 14.0 03/02/2021   HCT 42.9 03/02/2021   PLT 264.0 03/02/2021   GLUCOSE 77 03/02/2021   CHOL 173 03/02/2021   TRIG 106.0 03/02/2021   HDL 61.50 03/02/2021   LDLDIRECT 232.0 04/14/2016   LDLCALC 90 03/02/2021   ALT 23 03/02/2021   AST 18 03/02/2021   NA 143 03/02/2021   K 4.1 03/02/2021   CL 106 03/02/2021   CREATININE 0.90 03/02/2021   BUN 19 03/02/2021   CO2 26 03/02/2021   TSH 2.19 03/02/2021   INR 1.0 RATIO 04/04/2008   HGBA1C  11/26/2008    5.7 (NOTE) The ADA recommends the following therapeutic goal for glycemic control related to Hgb A1c measurement: Goal of therapy: <6.5 Hgb A1c  Reference: American Diabetes Association: Clinical Practice Recommendations 2010, Diabetes Care, 2010, 33: (Suppl  1).    MM 3D SCREEN BREAST BILATERAL  Result Date: 03/08/2019 CLINICAL DATA:  Screening. EXAM: DIGITAL SCREENING BILATERAL MAMMOGRAM WITH TOMO AND CAD COMPARISON:  Previous exam(s). ACR Breast Density Category b: There are scattered areas of fibroglandular density. FINDINGS: There are no findings suspicious for malignancy. Images were processed with CAD. IMPRESSION: No mammographic evidence of malignancy. A result letter of this screening mammogram will be mailed directly to the patient. RECOMMENDATION: Screening mammogram in one year. (Code:SM-B-01Y) BI-RADS CATEGORY  1: Negative. Electronically Signed   By: Lovey Newcomer M.D.   On: 03/08/2019 12:10    Assessment & Plan:   Ashley Cruz was seen today for annual exam, hypertension and hyperlipidemia.  Diagnoses and all orders for this  visit:  Essential hypertension- She has not achieved her blood pressure goal of 130/80.  Will restart the ARB. -     irbesartan (AVAPRO) 150 MG tablet; Take 1 tablet (150 mg total) by mouth daily. -     EKG 12-Lead -     CBC with Differential/Platelet; Future -     Basic metabolic panel; Future -     Urinalysis, Routine w reflex microscopic; Future -     Urinalysis, Routine w reflex microscopic -     Basic metabolic panel -     CBC with Differential/Platelet  Stage 3a chronic kidney  disease ([place]) -     CBC with Differential/Platelet; Future -     Basic metabolic panel; Future -     Urinalysis, Routine w reflex microscopic; Future -     Urinalysis, Routine w reflex microscopic -     Basic metabolic panel -     CBC with Differential/Platelet  Hyperlipidemia with target LDL less than 130- Will continue the statin for cardiovascular risk reduction. -     atorvastatin (LIPITOR) 40 MG tablet; TAKE 1 TABLET(40 MG) BY MOUTH DAILY AT 6 PM -     Lipid panel; Future -     TSH; Future -     Hepatic function panel; Future -     Hepatic function panel -     TSH -     Lipid panel  Routine general medical examination at a health care facility- Exam completed, labs reviewed, vaccines reviewed and updated, cancer screenings addressed, patient education was given.  Chronic renal disease, stage 3, moderately decreased glomerular filtration rate (GFR) between 30-59 mL/min/1.73 square meter (Ashley Cruz)- Her renal function is stable to slightly improved. -     irbesartan (AVAPRO) 150 MG tablet; Take 1 tablet (150 mg total) by mouth daily.  Hemiplegic migraine without status migrainosus, not intractable -     SUMAtriptan (IMITREX) 50 MG tablet; Take 1 tablet (50 mg total) by mouth every 2 (two) hours as needed for migraine.  Sensation of chest pressure- Labs and EKG are reassuring.  I recommended that she undergo a CT Ca++ score to screen for atherosclerosis. -     Brain natriuretic peptide; Future -      Troponin I (High Sensitivity); Future -     CT CARDIAC SCORING (SELF PAY ONLY) -     Troponin I (High Sensitivity) -     Brain natriuretic peptide  DOE (dyspnea on exertion)- See above. -     Brain natriuretic peptide; Future -     Troponin I (High Sensitivity); Future -     CT CARDIAC SCORING (SELF PAY ONLY) -     Troponin I (High Sensitivity) -     Brain natriuretic peptide  Visit for screening mammogram -     MM DIGITAL SCREENING BILATERAL; Future  I have discontinued Ashley Cruz's Saxenda and indapamide. I have also changed her irbesartan. Additionally, I am having her maintain her levocetirizine, clobetasol ointment, Pen Needles, escitalopram, atorvastatin, and SUMAtriptan.  Meds ordered this encounter  Medications   atorvastatin (LIPITOR) 40 MG tablet    Sig: TAKE 1 TABLET(40 MG) BY MOUTH DAILY AT 6 PM    Dispense:  90 tablet    Refill:  1   irbesartan (AVAPRO) 150 MG tablet    Sig: Take 1 tablet (150 mg total) by mouth daily.    Dispense:  90 tablet    Refill:  1   SUMAtriptan (IMITREX) 50 MG tablet    Sig: Take 1 tablet (50 mg total) by mouth every 2 (two) hours as needed for migraine.    Dispense:  12 tablet    Refill:  4      Follow-up: Return in about 3 months (around 06/02/2021).  Scarlette Calico, MD

## 2021-03-02 NOTE — Patient Instructions (Signed)

## 2021-03-03 DIAGNOSIS — H2512 Age-related nuclear cataract, left eye: Secondary | ICD-10-CM | POA: Diagnosis not present

## 2021-03-03 DIAGNOSIS — H26491 Other secondary cataract, right eye: Secondary | ICD-10-CM | POA: Diagnosis not present

## 2021-03-03 DIAGNOSIS — H25012 Cortical age-related cataract, left eye: Secondary | ICD-10-CM | POA: Diagnosis not present

## 2021-03-04 ENCOUNTER — Encounter: Payer: Self-pay | Admitting: Internal Medicine

## 2021-04-01 ENCOUNTER — Ambulatory Visit: Payer: Medicare Other | Admitting: Internal Medicine

## 2021-04-07 DIAGNOSIS — H2512 Age-related nuclear cataract, left eye: Secondary | ICD-10-CM | POA: Diagnosis not present

## 2021-04-07 DIAGNOSIS — H25042 Posterior subcapsular polar age-related cataract, left eye: Secondary | ICD-10-CM | POA: Diagnosis not present

## 2021-04-07 DIAGNOSIS — H26491 Other secondary cataract, right eye: Secondary | ICD-10-CM | POA: Diagnosis not present

## 2021-04-07 DIAGNOSIS — H18413 Arcus senilis, bilateral: Secondary | ICD-10-CM | POA: Diagnosis not present

## 2021-04-10 ENCOUNTER — Encounter: Payer: Self-pay | Admitting: Internal Medicine

## 2021-04-14 DIAGNOSIS — H26491 Other secondary cataract, right eye: Secondary | ICD-10-CM | POA: Diagnosis not present

## 2021-04-27 ENCOUNTER — Other Ambulatory Visit: Payer: Self-pay | Admitting: Internal Medicine

## 2021-04-27 DIAGNOSIS — F418 Other specified anxiety disorders: Secondary | ICD-10-CM

## 2021-05-04 ENCOUNTER — Ambulatory Visit
Admission: RE | Admit: 2021-05-04 | Discharge: 2021-05-04 | Disposition: A | Discharge status: Home / Self Care | Payer: Medicare Other | Source: Ambulatory Visit | Attending: Internal Medicine | Admitting: Internal Medicine

## 2021-05-04 ENCOUNTER — Other Ambulatory Visit: Payer: Self-pay

## 2021-05-04 DIAGNOSIS — Z1231 Encounter for screening mammogram for malignant neoplasm of breast: Secondary | ICD-10-CM
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
## 2021-05-29 ENCOUNTER — Other Ambulatory Visit: Payer: Self-pay

## 2021-05-29 ENCOUNTER — Encounter: Payer: Self-pay | Admitting: Internal Medicine

## 2021-05-29 ENCOUNTER — Ambulatory Visit (INDEPENDENT_AMBULATORY_CARE_PROVIDER_SITE_OTHER): Payer: Medicare Other | Admitting: Internal Medicine

## 2021-05-29 VITALS — BP 136/80 | HR 64 | Temp 98.3°F | Ht 64.0 in | Wt 163.0 lb

## 2021-05-29 DIAGNOSIS — M62838 Other muscle spasm: Secondary | ICD-10-CM | POA: Diagnosis not present

## 2021-05-29 MED ORDER — CYCLOBENZAPRINE HCL 5 MG PO TABS: 5.0000 mg | ORAL_TABLET | Freq: Every day | ORAL | 0 refills | Status: DC

## 2021-05-29 NOTE — Patient Instructions (Addendum)
     Medications changes include :   flexeril ( muscle relaxer) 5-10 mg in the evening or at night.   Take ibuprofen as needed.     Your prescription(s) have been submitted to your pharmacy. Please take as directed and contact our office if you believe you are having problem(s) with the medication(s).    Do gentle stretching.   Apply heat.       We can consider PT if needed.

## 2021-06-03 ENCOUNTER — Encounter: Payer: Self-pay | Admitting: Internal Medicine

## 2021-06-04 ENCOUNTER — Encounter: Payer: Self-pay | Admitting: Internal Medicine

## 2021-06-04 DIAGNOSIS — M542 Cervicalgia: Secondary | ICD-10-CM

## 2021-06-07 NOTE — Addendum Note (Signed)
Addended by: Binnie Rail on: 06/07/2021 11:05 AM   Modules accepted: Orders

## 2021-06-08 ENCOUNTER — Ambulatory Visit (INDEPENDENT_AMBULATORY_CARE_PROVIDER_SITE_OTHER): Payer: Medicare Other

## 2021-06-08 DIAGNOSIS — M542 Cervicalgia: Secondary | ICD-10-CM | POA: Diagnosis not present

## 2021-06-23 ENCOUNTER — Ambulatory Visit: Payer: Medicare Other | Attending: Internal Medicine | Admitting: Physical Therapy

## 2021-06-23 ENCOUNTER — Other Ambulatory Visit: Payer: Self-pay

## 2021-06-23 DIAGNOSIS — R293 Abnormal posture: Secondary | ICD-10-CM | POA: Insufficient documentation

## 2021-06-23 DIAGNOSIS — M6281 Muscle weakness (generalized): Secondary | ICD-10-CM | POA: Insufficient documentation

## 2021-06-23 DIAGNOSIS — M542 Cervicalgia: Secondary | ICD-10-CM | POA: Diagnosis present
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
## 2021-06-23 NOTE — Patient Instructions (Addendum)
Access Code: [email or web address] URL: [email or web address] Date: 06/23/2021 Prepared by: Voncille Lo  Exercises Supine Deep Neck Flexor Training - 2-3 x daily - 7 x weekly - 10 reps - 3 hold Seated Cervical Retraction Protraction AROM - 2-3 x daily - 7 x weekly - 1 sets - 10 reps Seated Gentle Upper Trapezius Stretch - 1 x daily - 7 x weekly - 1 sets - 3 reps - 30 hold Gentle Levator Scapulae Stretch - 1 x daily - 7 x weekly - 3 sets - 3 reps - 30 hold   Posture Tips DO: - stand tall and erect - keep chin tucked in - keep head and shoulders in alignment - check posture regularly in mirror or large window - pull head back against headrest in car seat;  Change your position often.  Sit with lumbar support. DON'T: - slouch or slump while watching TV or reading - sit, stand or lie in one position  for too long;  Sitting is especially hard on the spine so if you sit at a desk/use the computer, then stand up often!   Copyright  VHI. All rights reserved.  Posture - Standing   Good posture is important. Avoid slouching and forward head thrust. Maintain curve in low back and align ears over shoul- ders, hips over ankles.  Pull your belly button in toward your back bone. Ribs lifted up and chin down   Copyright  VHI. All rights reserved.  Posture - Sitting   Sit upright, head facing forward. Try using a roll to support lower back. Keep shoulders relaxed, and avoid rounded back. Keep hips level with knees. Avoid crossing legs for long periods. Sit on sit bones not tail bones,  do not cross legs  Trigger Point Dry Needling  What is Trigger Point Dry Needling (DN)? DN is a physical therapy technique used to treat muscle pain and dysfunction. Specifically, DN helps deactivate muscle trigger points (muscle knots).  A thin filiform needle is used to penetrate the skin and stimulate the underlying trigger point. The goal is for a local twitch response (LTR) to occur and for the trigger  point to relax. No medication of any kind is injected during the procedure.   What Does Trigger Point Dry Needling Feel Like?  The procedure feels different for each individual patient. Some patients report that they do not actually feel the needle enter the skin and overall the process is not painful. Very mild bleeding may occur. However, many patients feel a deep cramping in the muscle in which the needle was inserted. This is the local twitch response.   How Will I feel after the treatment? Soreness is normal, and the onset of soreness may not occur for a few hours. Typically this soreness does not last longer than two days.  Bruising is uncommon, however; ice can be used to decrease any possible bruising.  In rare cases feeling tired or nauseous after the treatment is normal. In addition, your symptoms may get worse before they get better, this period will typically not last longer than 24 hours.   What Can I do After My Treatment? Increase your hydration by drinking more water for the next 24 hours. You may place ice or heat on the areas treated that have become sore, however, do not use heat on inflamed or bruised areas. Heat often brings more relief post needling. You can continue your regular activities, but vigorous activity is not recommended initially after the treatment for 24 hours. DN  is best combined with other physical therapy such as strengthening, stretching, and other therapies.    Voncille Lo, PT, [place] Certified Exercise Expert for the Aging Adult  06/23/21 11:13 AM Phone: [phone] Fax: [phone]    Copyright  VHI. All rights reserved.

## 2021-06-25 ENCOUNTER — Encounter: Payer: Self-pay | Admitting: Internal Medicine

## 2021-07-01 ENCOUNTER — Encounter: Payer: Self-pay | Admitting: Physical Therapy

## 2021-07-01 ENCOUNTER — Ambulatory Visit: Payer: Medicare Other | Admitting: Physical Therapy

## 2021-07-01 ENCOUNTER — Other Ambulatory Visit: Payer: Self-pay

## 2021-07-01 DIAGNOSIS — R293 Abnormal posture: Secondary | ICD-10-CM

## 2021-07-01 DIAGNOSIS — M542 Cervicalgia: Secondary | ICD-10-CM | POA: Diagnosis not present

## 2021-07-01 DIAGNOSIS — M6281 Muscle weakness (generalized): Secondary | ICD-10-CM

## 2021-07-01 NOTE — Therapy (Signed)
[place], [place], [address] Phone: [phone]   Fax:  [phone]  Physical Therapy Treatment  Patient Details  Name: Ashley Cruz MRN: [id number] Date of Birth: 1946/07/02 Referring Provider (PT): Binnie Rail MD   Encounter Date: 07/01/2021   PT End of Session - 07/01/21 1515     Visit Number 2    Number of Visits 12    Date for PT Re-Evaluation 08/04/21    Authorization Type UHC MCR   FOTO 6 th visit and 10th visit progress note    PT Start Time 1500    PT Stop Time 1542    PT Time Calculation (min) 42 min             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   Subjective Assessment - 07/01/21 1504     Subjective Pt reports she was very sore after TPDN last session. Pt reports she is still hurting and that her left side is now spasming when she uses left arm. She notes improved cervical ROM.    Currently in Pain? Yes    Pain Score 4     Pain Location Neck    Pain Orientation Left;Right    Pain Descriptors / Indicators Tightness;Sore    Pain Type Chronic pain    Aggravating Factors  driving, painting    Pain Relieving Factors heat, tylenol                [facility] PT Assessment - 07/01/21 0001       AROM   Cervical - Right Rotation 40    Cervical -  Left Rotation 40              [facility] Adult PT Treatment/Exercise:  Therapeutic Exercise: - UBE level 1, 2 minutes each way - Green band rows x 20 - red band bilat ER with retraction x 20 - towel SNAG for cervical rotations x 10 each way - upper trap stretch x 3 each -levator stretch x 3 each - cervical protracct/retract x 10 -supine chin tuck on towel roll x 15  Manual Therapy: -   Neuromuscular re-ed: -   Therapeutic Activity: -   Self-care/Home Management: -              PT Short Term Goals - 06/23/21 1138       PT SHORT TERM GOAL #1   Title Demonstrate understanding of neutral posture and be more conscious of position and posture throughout the day.    Baseline not aware of how she gaurds and does not dissociate her neck from torso when rotatiing. neck  Time 3    Period Weeks    Status New    Target Date 07/14/21      PT SHORT TERM GOAL #2   Title Pt will be independent with intial HEP    Baseline no knowledge    Time 3    Period Weeks    Status New    Target Date 07/14/21               PT Long Term Goals - 06/23/21 1120       PT LONG TERM GOAL #1   Title Pt will be independent with advanced HEP given    Baseline no knowledge    Time 6    Period Weeks    Status New    Target Date 08/04/21      PT LONG TERM GOAL #2   Title Improved cervical rotation to allow checking blind spots while driving with minimal discomfort    Baseline unable to turn neck without rotating body while driving/gaurding    Time 6    Period Weeks    Status New    Target Date 08/04/21      PT LONG TERM GOAL #3   Title Pt will be able to paint  for an hour without neck/shoulder being exacerbated.    Baseline Pt unable to paint for 30 minutes without pain increasing    Time 6    Period Weeks    Status New    Target Date 08/04/21      PT LONG TERM GOAL #4   Title Demonstrate and verbalize techniques to reduce the risk of re-injury including: lifting,  posture, body mechanics.    Baseline no knowledge    Time 6    Period Weeks    Status New    Target Date 08/04/21      PT LONG TERM GOAL #5   Title FOTO will improve from 51%   to 60%    indicating improved functional mobility.    Baseline 51 % eval    Time 6    Period Weeks    Status New    Target Date 08/04/21                   Plan - 07/01/21 1517     Clinical Impression Statement Pt demonstrates improvement in AROM with stretches and TPDN. She was very sore after TPDN. Time spent with review of HEP and progression of ROM and strengthening exercises.She was given an updated HEP with therabands. After session pt demonstrated further improvement in cervical rotation to 45 degrees bilateral.    PT Treatment/Interventions Joint Manipulations;Passive range of motion;Dry needling;Taping;Manual techniques;Patient/family education;Neuromuscular re-education;Therapeutic exercise;Traction;Iontophoresis 4mg /ml Dexamethasone;Moist Heat;Electrical Stimulation;Cryotherapy    PT Next Visit Plan Review HEP  review towel cervical rotation and scap bands.  continue TPDN prn    PT Home Exercise Plan [email or web address]             Patient will benefit from skilled therapeutic intervention in order to improve the following deficits and impairments:  Obesity, Pain, Postural dysfunction, Improper body mechanics, Impaired UE functional use, Increased fascial restricitons, Hypomobility, Decreased strength, Decreased range of motion  Visit Diagnosis: Cervicalgia  Muscle weakness (generalized)  Abnormal posture     Problem List Patient Active Problem List   Diagnosis Date Noted   DOE (dyspnea on exertion) 03/02/2021   Sensation of chest pressure 03/02/2021   Hemiplegic migraine without status migrainosus, not intractable 03/02/2021   Intrinsic eczema 02/07/2019  Allergic contact dermatitis due to plants, except food 02/07/2019   Chronic renal disease, stage 3, moderately decreased  glomerular filtration rate (GFR) between 30-59 mL/min/1.73 square meter ([place]) 01/17/2019   Visit for screening mammogram 01/16/2019   Class 1 obesity due to excess calories with serious comorbidity in adult 12/05/2017   Essential hypertension 12/05/2017   Estrogen deficiency 04/14/2016   Routine general medical examination at a health care facility 06/06/2013   Osteoporosis, senile 02/14/2009   Cerebral artery occlusion with cerebral infarction ([place]) 02/13/2009   Hyperlipidemia with target LDL less than 130 11/11/2008   Anxiety with depression 11/11/2008    Dorene Ar, PTA 07/01/2021, 3:50 PM  [facility] [address] [place], [place], [address] Phone: [phone]   Fax:  [phone]  Name: Ashley Cruz MRN: [id number] Date of Birth: 1946-07-14

## 2021-07-01 NOTE — Patient Instructions (Signed)
Access Code: [email or web address] URL: [email or web address] Date: 07/01/2021 Prepared by: Hessie Diener  Exercises Supine Deep Neck Flexor Training - 2-3 x daily - 7 x weekly - 10 reps - 3 hold Seated Cervical Retraction Protraction AROM - 2-3 x daily - 7 x weekly - 1 sets - 10 reps Seated Gentle Upper Trapezius Stretch - 1 x daily - 7 x weekly - 1 sets - 3 reps - 30 hold Gentle Levator Scapulae Stretch - 1 x daily - 7 x weekly - 3 sets - 3 reps - 30 hold Seated Assisted Cervical Rotation with Towel - 1 x daily - 7 x weekly - 1 sets - 10 reps - 5 hold Standing Bilateral Low Shoulder Row with Anchored Resistance - 1 x daily - 7 x weekly - 2 sets - 10 reps Shoulder External Rotation and Scapular Retraction with Resistance - 1 x daily - 7 x weekly - 3 sets - 10 reps

## 2021-07-06 ENCOUNTER — Encounter: Payer: Self-pay | Admitting: Physical Therapy

## 2021-07-07 ENCOUNTER — Ambulatory Visit: Payer: Medicare Other | Admitting: Physical Therapy

## 2021-07-07 ENCOUNTER — Other Ambulatory Visit: Payer: Self-pay

## 2021-07-07 DIAGNOSIS — R293 Abnormal posture: Secondary | ICD-10-CM

## 2021-07-07 DIAGNOSIS — M542 Cervicalgia: Secondary | ICD-10-CM | POA: Diagnosis not present

## 2021-07-07 DIAGNOSIS — M6281 Muscle weakness (generalized): Secondary | ICD-10-CM

## 2021-07-07 NOTE — Therapy (Signed)
Ashley Cruz, [place], [address] Phone: [phone]   Fax:  [phone]  Physical Therapy Treatment  Patient Details  Name: Ashley Cruz MRN: [id number] Date of Birth: 1945/09/14 Referring Provider (PT): Binnie Rail MD   Encounter Date: 07/07/2021   PT End of Session - 07/07/21 1319     Visit Number 3    Number of Visits 12    Date for PT Re-Evaluation 08/04/21    Authorization Type UHC MCR   FOTO 6 th visit and 10th visit progress note    PT Start Time 1145    PT Stop Time 1235    PT Time Calculation (min) 50 min    Activity Tolerance Patient tolerated treatment well    Behavior During Therapy WFL for tasks assessed/performed             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   Subjective Assessment - 07/07/21 1316     Subjective I really hurt last night 10/10.  I am doing a lot for my husband but I said that I needed to concentrate on my healing right now.   I am an 75/10 now  I just feel so tender.  I have felt bad and I havent really done my exericises    Pertinent History Hyperlidemia.  4 kidneys one removed. Osteoporosis, obesity see medical chart    Diagnostic tests x ray    Patient Stated Goals I want to get rid of pain and be more flexible in my neck/shoulders     Currently in Pain? Yes    Pain Score 8     Pain Location Neck    Pain Orientation Left;Right    Pain Descriptors / Indicators Tightness;Sore    Pain Type Chronic pain    Pain Onset More than a month ago    Pain Frequency Intermittent                [facility] PT Assessment - 07/07/21 0001       AROM   Cervical - Right Rotation 45    Cervical - Left Rotation 40               [facility] Adult PT Treatment/Exercise:   Therapeutic Exercise: - UBE level 2, 2 minutes each way - Green band rows x 20 - green band bilat ER with retraction x 20 - towel SNAG for cervical rotations x 10 each way,  had to VC and TC for correct execution and return demo - upper trap stretch x 3 each -levator stretch x 3 each - cervical protracct/retract x 10 -supine chin tuck on towel  roll x 15   Manual Therapy: - CPA on left and right Gentle grade II/III in prone - suboccipital release - thoracic PA T- 3 to T-8 - cervical distraction in sitting STW over bil rhomboids, bil levator and bil Upper trap    Neuromuscular re-ed: -    Therapeutic Activity: -    Self-care/Home Management:                  Trigger Point Dry Needling - 07/07/21 0001     Consent Given? Yes    Education Handout Provided Previously provided    Muscles Treated Head and Neck Upper trapezius;Levator scapulae;Cervical multifidi    Muscles Treated Upper Quadrant Rhomboids    Dry Needling Comments 50 mm .30 gage    Electrical Stimulation Performed with Dry Needling Yes    E-stim with Dry Needling Details 20 pps to pt tolerance for 8 minutes    Upper Trapezius Response Twitch reponse elicited;Palpable increased muscle length    Levator Scapulae Response Twitch response elicited;Palpable increased muscle length    Cervical multifidi Response Palpable increased muscle length   C-4 to c-6   Rhomboids Response Twitch response elicited;Palpable increased muscle length                   PT Education  - 07/07/21 1317     Education Details reviewed HEP, educated on importance of movement    Person(s) Educated Patient    Methods Explanation;Demonstration;Tactile cues;Verbal cues    Comprehension Verbalized understanding;Returned demonstration              PT Short Term Goals - 07/07/21 1319       PT SHORT TERM GOAL #1   Title Demonstrate understanding of neutral posture and be more conscious of position and posture throughout the day.    Baseline Pt more aware of posture and need for movement    Time 3    Period Weeks    Status On-going    Target Date 07/14/73      PT SHORT TERM GOAL #2   Title Pt will be independent with intial HEP    Baseline Pt independent but not compliant    Time 3    Period Weeks    Status On-going    Target Date 07/14/21               PT Long Term Goals - 06/23/21 1120       PT LONG TERM GOAL #1   Title Pt will be independent with advanced HEP given    Baseline no knowledge    Time 6    Period Weeks    Status New    Target Date 08/04/21      PT LONG TERM GOAL #2   Title Improved cervical rotation to allow checking blind spots while driving with minimal discomfort    Baseline unable to turn neck without rotating body while driving/gaurding    Time 6    Period Weeks    Status New    Target Date 08/04/21      PT LONG TERM GOAL #3   Title Pt will be able to paint  for an hour without neck/shoulder being exacerbated.    Baseline Pt unable to paint for 30 minutes without pain increasing    Time 6    Period Weeks    Status New    Target Date 08/04/21      PT LONG TERM GOAL #4   Title  Demonstrate and verbalize techniques to reduce the risk of re-injury including: lifting, posture, body mechanics.    Baseline no knowledge    Time 6    Period Weeks    Status New    Target Date 08/04/21      PT LONG TERM GOAL #5   Title FOTO will improve from 51%   to 60%    indicating improved functional mobility.    Baseline 51 % eval     Time 6    Period Weeks    Status New    Target Date 08/04/21                   Plan - 07/07/21 1331     Clinical Impression Statement Pt enters clinic with complaint of 10/10 pain last night and 8/10 today.  Pt consents to TPDN and was closely monitored.  Due to soreness of last TPDN, pt given e stim wiht TPDN and it was must better tolerated today.  With decreased pain and increased AROM to R cervical rot 45 and L 40 today. Pt able to complete and review HEP with greater ease after RX.  PT emphasized importance of continuing to move for increasing capacity to load and rotate neck.  Pt stated she would be more compliant with exercise and was clearly moving better at end of RX.    Personal Factors and Comorbidities Age;Comorbidity 2    Comorbidities Hyperlidemia.  4 kidneys one removed. Osteoporosis, obesity see medical chart.    Examination-Activity Limitations Lift;Other    Examination-Participation Restrictions Occupation;Other    PT Frequency 2x / week    PT Duration 6 weeks    PT Treatment/Interventions Joint Manipulations;Passive range of motion;Dry needling;Taping;Manual techniques;Patient/family education;Neuromuscular re-education;Therapeutic exercise;Traction;Iontophoresis 4mg /ml Dexamethasone;Moist Heat;Electrical Stimulation;Cryotherapy    PT Next Visit Plan Review HEP  review towel cervical rotation and scap bands.  continue TPDN prn    PT Home Exercise Plan [email or web address]    Consulted and Agree with Plan of Care Patient             Patient will benefit from skilled therapeutic intervention in order to improve the following deficits and impairments:  Obesity, Pain, Postural dysfunction, Improper body mechanics, Impaired UE functional use, Increased fascial restricitons, Hypomobility, Decreased strength, Decreased range of motion  Visit Diagnosis: Cervicalgia  Muscle weakness (generalized)  Abnormal posture     Problem List Patient Active Problem List    Diagnosis Date Noted   DOE (dyspnea on exertion) 03/02/2021   Sensation of chest pressure 03/02/2021   Hemiplegic migraine without status migrainosus, not intractable 03/02/2021   Intrinsic eczema 02/07/2019   Allergic contact dermatitis due to plants, except food 02/07/2019   Chronic renal disease, stage 3, moderately decreased glomerular filtration rate (GFR) between 30-59 mL/min/1.73 square meter ([place]) 01/17/2019   Visit for screening mammogram 01/16/2019   Class 1 obesity due to excess calories with serious comorbidity in adult 12/05/2017   Essential hypertension 12/05/2017   Estrogen deficiency 04/14/2016   Routine general medical examination at a health care facility 06/06/2013   Osteoporosis, senile 02/14/2009   Cerebral artery occlusion with cerebral infarction ([place]) 02/13/2009   Hyperlipidemia with target LDL less than 130 11/11/2008   Anxiety with depression 11/11/2008    Voncille Lo, PT, [place] Certified Exercise Expert for the Aging Adult  07/07/21 1:38 PM Phone: [phone] Fax: [facility] [address] [place], [place], [address] Phone: [phone]   Fax:  [phone]  Name: RAMANI RIVA MRN: [id number] Date of Birth: 24-Oct-1945

## 2021-07-09 ENCOUNTER — Ambulatory Visit: Payer: Medicare Other | Admitting: Physical Therapy

## 2021-07-09 ENCOUNTER — Encounter: Payer: Self-pay | Admitting: Physical Therapy

## 2021-07-09 ENCOUNTER — Other Ambulatory Visit: Payer: Self-pay

## 2021-07-09 DIAGNOSIS — M542 Cervicalgia: Secondary | ICD-10-CM

## 2021-07-09 DIAGNOSIS — M6281 Muscle weakness (generalized): Secondary | ICD-10-CM

## 2021-07-09 DIAGNOSIS — R293 Abnormal posture: Secondary | ICD-10-CM
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
## 2021-07-09 NOTE — Therapy (Deleted)
[facility] Adult PT Treatment:     DATE: *** Therapeutic Exercise: *** Manual Therapy: *** Neuromuscular re-ed: *** Therapeutic Activity: *** Modalities: *** Self Care: ***

## 2021-07-14 ENCOUNTER — Ambulatory Visit: Payer: Medicare Other | Admitting: Physical Therapy

## 2021-07-14 ENCOUNTER — Other Ambulatory Visit: Payer: Self-pay

## 2021-07-14 DIAGNOSIS — M542 Cervicalgia: Secondary | ICD-10-CM | POA: Diagnosis not present

## 2021-07-14 DIAGNOSIS — R293 Abnormal posture: Secondary | ICD-10-CM

## 2021-07-14 DIAGNOSIS — M6281 Muscle weakness (generalized): Secondary | ICD-10-CM

## 2021-07-14 NOTE — Patient Instructions (Signed)
Access Code: [email or web address] URL: [email or web address] Date: 07/14/2021 Prepared by: Voncille Lo  Exercises added 07-14-21  Standing Shoulder Diagonal Horizontal Abduction 60/120 Degrees with Resistance - 1 x daily - 7 x weekly - 3 sets - 10 reps Prone Shoulder Horizontal Abduction with Thumbs Up - 1 x daily - 7 x weekly - 3 sets - 10 reps Prone W Scapular Retraction - 1 x daily - 7 x weekly - 3 sets - 10 reps Prone Lower Trapezius Strengthening on Swiss Ball - 1 x daily - 7 x weekly - 3 sets - 10 reps  Voncille Lo, PT, [place] Certified Exercise Expert for the Aging Adult  07/14/21 12:14 PM Phone: [phone] Fax: [phone]

## 2021-07-14 NOTE — Therapy (Signed)
[place], [place], [address] Phone: [phone]   Fax:  [phone]  Physical Therapy Treatment  Patient Details  Name: Ashley Cruz MRN: [id number] Date of Birth: 28-Mar-1946 Referring Provider (PT): Binnie Rail MD   Encounter Date: 07/14/2021   PT End of Session - 07/14/21 1217     Visit Number 5    Number of Visits 12    Date for PT Re-Evaluation 08/04/21    Authorization Type UHC MCR   FOTO 6 th visit and 10th visit progress note    PT Start Time 1200    PT Stop Time 1230    PT Time Calculation (min) 30 min    Activity Tolerance Patient tolerated treatment well    Behavior During Therapy Ashley Cruz for tasks assessed/performed             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   Subjective Assessment - 07/14/21 1158     Pain Score 3     Pain Location Neck    Pain Orientation Left;Right    Pain Descriptors / Indicators Aching    Pain Type Chronic pain                [facility] PT Assessment - 07/14/21 0001       AROM   Cervical - Right Rotation 45    Cervical - Left Rotation 42                 [facility] Adult PT Treatment:     DATE: 07/09/2021 Therapeutic Exercise: Chin tuck head lift 1 x 8 holding 10 seconds Upper trap and levator scapulae stretch 1 x  30 sec with over pressure  using towel to add tack and stretch self stretch with improvement in pain 2/10 Cervical snags 1 x 8  Cues for proper form / hand placement Upper cervical rotation with tactile cues to keep chin tuck 1 x 10 Diagonals and Horizontal Abduction 3 x 10 with RTB Prone Shoulder T with neck elongated and thumbs pointed up 2 x 10 Prone W scapular retraction with upper back ext with chin tuck 2 x 10 Prone lower trap/scaption in prone 3 x 10  Self Care: Educated on Self care and prioritizing/reinforce proper neck position and chin tuck during exercise.  Use of towel on the road to add stretch with tense driving. Problems solve exercise on the road         Not done this visit  Manual Therapy: MTPR along bil upper trap How to perform self Trigger point release  Manual cervical traction with towel Sub-occipital release  PT Short Term Goals - 07/14/21 1224       PT SHORT TERM GOAL #1   Title Demonstrate understanding of neutral posture and be more conscious of position and posture throughout the day.    Baseline Pt more aware of posture and need for movement    Time 3    Period Weeks    Status Achieved    Target Date 07/14/21      PT SHORT TERM GOAL #2   Title Pt will be independent with intial HEP    Baseline Pt independent with intial and working  on advanced strength    Time 3    Period Weeks    Status Achieved    Target Date 07/14/21               PT Long Term Goals - 07/14/21 1225       PT LONG TERM GOAL #1   Title Pt will be independent with advanced HEP given    Baseline given advanced strength for the road    Time 6    Period Weeks    Status On-going    Target Date 08/04/21      PT LONG TERM GOAL #2   Title Improved cervical rotation to allow checking blind spots while driving with minimal discomfort    Baseline Pt able to turn neck better, sometimes using body for different road construction problems     Time 6    Period Weeks    Status On-going    Target Date 08/04/21      PT LONG TERM GOAL #3   Title Pt will be able to paint  for an hour without neck/shoulder being exacerbated.    Baseline Pt has not tried to paint    Time 6    Period Weeks    Status On-going    Target Date 08/04/21      PT LONG TERM GOAL #4   Title Demonstrate and verbalize techniques to reduce the risk of re-injury including: lifting, posture, body mechanics.    Baseline discussiong today to use exercise and to care for neck on the road    Time 6    Period Weeks    Status On-going    Target Date 08/04/21      PT LONG TERM GOAL #5   Title FOTO will improve from 51%   to 60%    indicating improved functional mobility.    Baseline 51 % eval    Time 6    Period Weeks    Status Unable to assess    Target Date 08/04/21                   Plan - 07/14/21 1204     Clinical Impression Statement Pt arrives to PT with 3/10 and concentrating on strengthening  exercise this session and added to HEP.  Pt achieved all STG's this session and is working toward independence with HEP and managing pain on her own.  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  Pt was able to bring her pain to a 1-2/10 during Rx session.    Personal Factors and Comorbidities Age;Comorbidity 2    Comorbidities Hyperlidemia.  4 kidneys one removed. Osteoporosis, obesity see medical chart.    Examination-Activity Limitations Lift;Other    Examination-Participation Restrictions Occupation;Other    PT Frequency 2x / week    PT Duration 6 weeks  PT Treatment/Interventions Joint Manipulations;Passive range of motion;Dry needling;Taping;Manual techniques;Patient/family education;Neuromuscular re-education;Therapeutic exercise;Traction;Iontophoresis 4mg /ml Dexamethasone;Moist Heat;Electrical Stimulation;Cryotherapy    PT Next Visit Plan FOTO and review strengthening exercise.  working on  Designer, industrial/product for road .    PT Home Exercise Plan [email or web address]    Consulted and Agree with Plan of Care Patient             Patient will benefit from skilled therapeutic intervention in order to improve the following deficits and impairments:  Obesity, Pain, Postural dysfunction, Improper body mechanics, Impaired UE functional use, Increased fascial restricitons, Hypomobility, Decreased strength, Decreased range of motion  Visit Diagnosis: Cervicalgia  Muscle weakness (generalized)  Abnormal posture     Problem List Patient Active Problem List   Diagnosis Date Noted   DOE (dyspnea on exertion) 03/02/2021   Sensation of chest pressure 03/02/2021   Hemiplegic migraine without status migrainosus, not intractable 03/02/2021   Intrinsic eczema 02/07/2019   Allergic contact dermatitis due to plants, except food 02/07/2019   Chronic renal disease, stage 3, moderately decreased glomerular filtration rate (GFR) between 30-59 mL/min/1.73 square meter ([place]) 01/17/2019   Visit for screening mammogram 01/16/2019   Class 1 obesity due to excess calories with serious comorbidity in adult 12/05/2017   Essential hypertension 12/05/2017   Estrogen deficiency 04/14/2016   Routine general medical examination at a health care facility 06/06/2013   Osteoporosis, senile 02/14/2009   Cerebral artery occlusion with cerebral infarction ([place]) 02/13/2009   Hyperlipidemia with target LDL less than 130 11/11/2008   Anxiety with depression 11/11/2008    Ashley Cruz, PT, [place] Certified Exercise Expert for the Aging Adult  07/14/21 2:21 PM Phone: [phone] Fax: [facility] [address] [place], [place], [address] Phone: [phone]   Fax:  [phone]  Name: Ashley Cruz MRN: [id number] Date of Birth: February 04, 1946

## 2021-07-16 ENCOUNTER — Encounter: Payer: Self-pay | Admitting: Physical Therapy

## 2021-07-16 ENCOUNTER — Ambulatory Visit: Payer: Medicare Other | Admitting: Physical Therapy

## 2021-07-16 ENCOUNTER — Other Ambulatory Visit: Payer: Self-pay

## 2021-07-16 DIAGNOSIS — M542 Cervicalgia: Secondary | ICD-10-CM | POA: Diagnosis not present

## 2021-07-16 DIAGNOSIS — R293 Abnormal posture: Secondary | ICD-10-CM

## 2021-07-16 DIAGNOSIS — M6281 Muscle weakness (generalized): Secondary | ICD-10-CM

## 2021-07-16 NOTE — Therapy (Signed)
[place], [place], [address] Phone: [phone]   Fax:  [phone]  Physical Therapy Treatment  Patient Details  Name: Ashley Cruz MRN: [id number] Date of Birth: 1945/10/07 Referring Provider (PT): Binnie Rail MD   Encounter Date: 07/16/2021   PT End of Session - 07/16/21 1100     Visit Number 6    Number of Visits 12    Date for PT Re-Evaluation 08/04/21    Authorization Type UHC [place] visit progress note    PT Start Time 1100    PT Stop Time 5625    PT Time Calculation (min) 48 min    Activity Tolerance Patient tolerated treatment well    Behavior During Therapy [facility] for tasks assessed/performed             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   Subjective Assessment - 07/16/21 1101     Subjective "The neck has still been giving me some issues."    Patient Stated Goals I want to get rid of pain and be more flexible in my neck/shoulders    Currently in Pain? Yes    Pain Score 4     Pain Location Neck    Pain Orientation Left    Pain Descriptors / Indicators Aching    Pain Type Chronic pain    Pain Onset More than a month ago    Pain Frequency Intermittent    Aggravating Factors  drivign.                [facility] PT Assessment - 07/16/21  0001       Assessment   Medical Diagnosis neck pain    Referring Provider (PT) Binnie Rail MD      Observation/Other Assessments   Focus on Therapeutic Outcomes (FOTO)  57%              [facility] Adult PT Treatment:                                                DATE: 07/16/2021  Therapeutic Exercise: UBE L3 x 4 min  (FWD/BWD x 2 min) Anterior pelvic tilt 1 x 20 holding 5 seconds ea. Cues to keep core tight Upper trap stretch 2 x 30 seconds with overpressure Supine foam roll technique using rolled towel 1 x 15 ea. Ceiling punches Horizontal abd/ adduction Alternating ceiling punches X to Y  Manual Therapy: IASTM along the L upper trap and levator scapulae  Thoracic PA grade III T1-T8 Bil first rib mobs   Trigger Point Dry-Needling  Treatment instructions: Expect mild to moderate muscle soreness. S/S of pneumothorax if dry needled over a lung field, and to seek immediate medical attention should they occur. Patient verbalized understanding of these instructions and education.  Patient Consent Given: Yes Education handout provided: Previously provided Muscles treated: L upper trap Electrical stimulation performed: Yes Parameters:  CPS 20 x 8 min adjusting to tolerance PRN Treatment response/outcome: Twitch response elicited and Palpable decrease in muscle tension                         PT Education - 07/16/21 1129     Education Details Reviewed FOTO assessment    Person(s) Educated Patient    Methods Explanation    Comprehension Verbalized understanding              PT Short Term Goals - 07/14/21 1224       PT SHORT TERM GOAL #1   Title Demonstrate understanding of neutral posture and be more conscious of position and posture throughout the day.    Baseline Pt more aware of posture and need for movement    Time 3    Period Weeks    Status Achieved    Target Date 07/14/21      PT SHORT TERM GOAL #2   Title Pt will be independent  with intial HEP    Baseline Pt independent with intial and working  on advanced strength    Time 3    Period Weeks    Status Achieved    Target Date 07/14/21               PT Long Term Goals - 07/14/21 1225       PT LONG TERM GOAL #1   Title Pt will be independent with advanced HEP given    Baseline given advanced strength for the road    Time 6    Period Weeks    Status On-going    Target Date 08/04/21      PT LONG TERM GOAL #2   Title Improved cervical rotation to allow checking blind spots while driving with minimal discomfort    Baseline Pt able to turn neck better, sometimes using body for different road construction problems    Time 6    Period Weeks    Status On-going    Target Date 08/04/21      PT LONG TERM GOAL #3   Title Pt will be able to paint  for an hour without neck/shoulder being exacerbated.    Baseline Pt has not tried to paint    Time 6    Period Weeks    Status On-going    Target Date 08/04/21      PT LONG TERM GOAL #4   Title Demonstrate and verbalize techniques to reduce the risk of re-injury including: lifting, posture, body mechanics.    Baseline discussiong today to use exercise and to care for neck on the road    Time 6    Period Weeks    Status On-going    Target Date 08/04/21      PT LONG TERM GOAL #5   Title FOTO will improve from 51%   to 60%    indicating improved functional mobility.    Baseline 51 % eval    Time 6    Period Weeks    Status Unable  to assess    Target Date 08/04/21                   Plan - 07/16/21 1116     Clinical Impression Statement pt reports continued L shoulder pain which today was rated at 3-4/10. Performed TPDN focus on the L upper trap combined Estim followed with IASTM techniques and thoracic mobs. continued working on thoracic mobility and thoracoscapular mobility which she did well with.    Comorbidities Hyperlidemia.  4 kidneys one removed. Osteoporosis, obesity see medical chart.     PT Treatment/Interventions Joint Manipulations;Passive range of motion;Dry needling;Taping;Manual techniques;Patient/family education;Neuromuscular re-education;Therapeutic exercise;Traction;Iontophoresis 4mg /ml Dexamethasone;Moist Heat;Electrical Stimulation;Cryotherapy    PT Next Visit Plan review strengthening exercise.  working on Designer, industrial/product for road .    PT Home Exercise Plan [email or web address]    Consulted and Agree with Plan of Care Patient             Patient will benefit from skilled therapeutic intervention in order to improve the following deficits and impairments:  Obesity, Pain, Postural dysfunction, Improper body mechanics, Impaired UE functional use, Increased fascial restricitons, Hypomobility, Decreased strength, Decreased range of motion  Visit Diagnosis: Cervicalgia  Muscle weakness (generalized)  Abnormal posture     Problem List Patient Active Problem List   Diagnosis Date Noted   DOE (dyspnea on exertion) 03/02/2021   Sensation of chest pressure 03/02/2021   Hemiplegic migraine without status migrainosus, not intractable 03/02/2021   Intrinsic eczema 02/07/2019   Allergic contact dermatitis due to plants, except food 02/07/2019   Chronic renal disease, stage 3, moderately decreased glomerular filtration rate (GFR) between 30-59 mL/min/1.73 square meter ([place]) 01/17/2019   Visit for screening mammogram 01/16/2019   Class 1 obesity due to excess calories with serious comorbidity in adult 12/05/2017   Essential hypertension 12/05/2017   Estrogen deficiency 04/14/2016   Routine general medical examination at a health care facility 06/06/2013   Osteoporosis, senile 02/14/2009   Cerebral artery occlusion with cerebral infarction ([place]) 02/13/2009   Hyperlipidemia with target LDL less than 130 11/11/2008   Anxiety with depression 11/11/2008   Starr Lake PT, DPT, LAT, ATC  07/16/21  11:49 AM      [facility] [address] [place], [place], [address] Phone: [phone]   Fax:  [phone]  Name: TRUDI MORGENTHALER MRN: [id number] Date of Birth: 20-Sep-1945

## 2021-07-21 ENCOUNTER — Ambulatory Visit: Payer: Medicare Other | Admitting: Physical Therapy

## 2021-07-23 ENCOUNTER — Other Ambulatory Visit: Payer: Self-pay

## 2021-07-23 ENCOUNTER — Encounter: Payer: Self-pay | Admitting: Physical Therapy

## 2021-07-23 ENCOUNTER — Ambulatory Visit: Payer: Medicare Other | Attending: Internal Medicine | Admitting: Physical Therapy

## 2021-07-23 DIAGNOSIS — M542 Cervicalgia: Secondary | ICD-10-CM | POA: Diagnosis not present

## 2021-07-23 DIAGNOSIS — R293 Abnormal posture: Secondary | ICD-10-CM | POA: Insufficient documentation

## 2021-07-23 DIAGNOSIS — M6281 Muscle weakness (generalized): Secondary | ICD-10-CM | POA: Insufficient documentation

## 2021-07-23 NOTE — Therapy (Signed)
[place], [place], [address] Phone: [phone]   Fax:  [phone]  Physical Therapy Treatment  Patient Details  Name: Ashley Cruz MRN: [id number] Date of Birth: 05-30-1946 Referring Provider (PT): Binnie Rail MD   Encounter Date: 07/23/2021   PT End of Session - 07/23/21 1142     Visit Number 7    Number of Visits 12    Date for PT Re-Evaluation 08/04/21    Authorization Type UHC [place] visit progress note    PT Start Time 1142    Activity Tolerance Patient tolerated treatment well    Behavior During Therapy [facility] for tasks assessed/performed             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   Subjective Assessment - 07/23/21 1145     Subjective "I was doing pretty good, but work adds to my stress and soreness. I did wake up one morning with no pain."    Currently in Pain? Yes    Pain Score 2     Pain Orientation Left    Pain Descriptors / Indicators Aching    Pain Type Chronic pain    Aggravating Factors  driving    Pain Relieving Factors heat, tylenol                     [facility] Adult PT Treatment:                                                DATE: 07/23/2021  Therapeutic Exercise: UBE L2 x 4 min (FWD/BWD x 2 min ea) L upper trap  stretch 1 x 30 sec Seated on dyna disc 1 x 10 anterior pelvic holding 5 seconds Seated marching 2 x 20 Seated on dyna disc Seated horizontal abduction 1 x 15 with RTB Seated on dyna disc Scapular retraction with double ER 1 x 15 with GTB Seated on dyna disc Manual Therapy: IASTM Along the L upper trap/ levator scapulate T1-T7 PA grade III Bil first rib inferior mobs grade III  Trigger Point Dry-Needling  Treatment instructions: Expect mild to moderate muscle soreness. S/S of pneumothorax if dry needled over a lung field, and to seek immediate medical attention should they occur. Patient verbalized understanding of these instructions and education.  Patient Consent Given: Yes Education handout provided: Yes Muscles treated: L upper trap Electrical stimulation performed: Yes Parameters:  CPS Lvl 20 x 8 min adjusting intensity PRN Treatment response/outcome: Twitch  response elicited and Palpable decrease in muscle tension  Therapeutic Activity: Reviewed seated positioning / posture in related to neck posture Benefits of deep breathing to help with relaxation and help reduce stress                    PT Education - 07/23/21 1201     Education Details reviewed seated posture    Person(s) Educated Patient    Methods Explanation    Comprehension Verbalized understanding              PT Short Term Goals - 07/14/21 1224       PT SHORT TERM GOAL #1   Title Demonstrate understanding of neutral posture and be more conscious of position and posture throughout the day.    Baseline Pt more aware of posture and need for movement    Time 3    Period Weeks    Status Achieved    Target Date 07/14/21      PT SHORT TERM GOAL #2   Title Pt will be independent with intial HEP    Baseline Pt independent with intial and working  on advanced strength    Time 3    Period Weeks    Status Achieved    Target Date 07/14/21               PT Long Term Goals -  07/14/21 1225       PT LONG TERM GOAL #1   Title Pt will be independent with advanced HEP given    Baseline given advanced strength for the road    Time 6    Period Weeks    Status On-going    Target Date 08/04/21      PT LONG TERM GOAL #2   Title Improved cervical rotation to allow checking blind spots while driving with minimal discomfort    Baseline Pt able to turn neck better, sometimes using body for different road construction problems    Time 6    Period Weeks    Status On-going    Target Date 08/04/21      PT LONG TERM GOAL #3   Title Pt will be able to paint  for an hour without neck/shoulder being exacerbated.    Baseline Pt has not tried to paint    Time 6    Period Weeks    Status On-going    Target Date 08/04/21      PT LONG TERM GOAL #4   Title Demonstrate and verbalize techniques to reduce the risk of re-injury including: lifting, posture, body mechanics.    Baseline discussiong today to use exercise and to care for neck on the road    Time 6    Period Weeks    Status On-going    Target Date 08/04/21      PT LONG TERM GOAL #5   Title FOTO will improve from 51%   to 60%    indicating improved functional mobility.    Baseline 51 % eval    Time 6    Period Weeks    Status Unable to assess    Target Date 08/04/21                   Plan - 07/23/21 1211     Clinical Impression Statement pt arrives to session noting she has some good relief following the pevious session but today has increased stiffness/ soreness with pain rated at 2/10. continued  TPDN for the L upper trap combined with Estim followed with IASTM techniques and thoracic mobs. continued working on posterior chain strengthening as well as working on seated posture. provided handout for seated posture to promote neutral positioning.    PT Treatment/Interventions Joint Manipulations;Passive range of motion;Dry needling;Taping;Manual techniques;Patient/family education;Neuromuscular  re-education;Therapeutic exercise;Traction;Iontophoresis 4mg /ml Dexamethasone;Moist Heat;Electrical Stimulation;Cryotherapy    PT Next Visit Plan review strengthening exercise.  working on Designer, industrial/product for road .    PT Home Exercise Plan [email or web address]             Patient will benefit from skilled therapeutic intervention in order to improve the following deficits and impairments:     Visit Diagnosis: Cervicalgia  Muscle weakness (generalized)  Abnormal posture     Problem List Patient Active Problem List   Diagnosis Date Noted   DOE (dyspnea on exertion) 03/02/2021   Sensation of chest pressure 03/02/2021   Hemiplegic migraine without status migrainosus, not intractable 03/02/2021   Intrinsic eczema 02/07/2019   Allergic contact dermatitis due to plants, except food 02/07/2019   Chronic renal disease, stage 3, moderately decreased glomerular filtration rate (GFR) between 30-59 mL/min/1.73 square meter ([place]) 01/17/2019   Visit for screening mammogram 01/16/2019   Class 1 obesity due to excess calories with serious comorbidity in adult 12/05/2017   Essential hypertension 12/05/2017   Estrogen deficiency 04/14/2016   Routine general medical examination at a health care facility 06/06/2013   Osteoporosis, senile 02/14/2009   Cerebral artery occlusion with cerebral infarction ([place]) 02/13/2009   Hyperlipidemia with target LDL less than 130 11/11/2008   Anxiety with depression 11/11/2008    Starr Lake PT, DPT, LAT, ATC  07/23/21  12:30 PM     [facility] [address] [place], [place], [address] Phone: [phone]   Fax:  [phone]  Name: Ashley Cruz MRN: [id number] Date of Birth: 21-May-1946

## 2021-07-23 NOTE — Patient Instructions (Addendum)

## 2021-07-24 ENCOUNTER — Telehealth: Payer: Self-pay | Admitting: Internal Medicine

## 2021-07-24 NOTE — Telephone Encounter (Signed)
N/A unable to leave a message for patient to call me back at [phone] to schedule Medicare Annual Wellness Visit   Last AWV  03/05/20  Please schedule at anytime with [facility] if patient calls the office back.    40 Minutes appointment   Any questions, please call me at [phone]

## 2021-07-26 ENCOUNTER — Other Ambulatory Visit: Payer: Self-pay | Admitting: Internal Medicine

## 2021-07-26 DIAGNOSIS — F418 Other specified anxiety disorders: Secondary | ICD-10-CM

## 2021-07-28 ENCOUNTER — Ambulatory Visit: Payer: Medicare Other | Admitting: Physical Therapy

## 2021-07-30 ENCOUNTER — Ambulatory Visit: Payer: Medicare Other | Admitting: Physical Therapy

## 2021-07-30 ENCOUNTER — Other Ambulatory Visit: Payer: Self-pay

## 2021-07-30 ENCOUNTER — Encounter: Payer: Self-pay | Admitting: Physical Therapy

## 2021-07-30 DIAGNOSIS — R293 Abnormal posture: Secondary | ICD-10-CM

## 2021-07-30 DIAGNOSIS — M542 Cervicalgia: Secondary | ICD-10-CM | POA: Diagnosis not present

## 2021-07-30 DIAGNOSIS — M6281 Muscle weakness (generalized): Secondary | ICD-10-CM

## 2021-07-30 NOTE — Therapy (Signed)
[place], [place], [address] Phone: [phone]   Fax:  [phone]  Physical Therapy Treatment  Patient Details  Name: Ashley Cruz MRN: [id number] Date of Birth: Sep 11, 1945 Referring Provider (PT): Binnie Rail MD   Encounter Date: 07/30/2021   PT End of Session - 07/30/21 1102     Visit Number 8    Number of Visits 12    Date for PT Re-Evaluation 08/04/21    Authorization Type UHC 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   Subjective Assessment - 07/30/21 1103     Subjective "I am still pretty stressed, my husband's heart surgery didn't take so they are planning a pacemaker.    Pertinent History Hyperlidemia.  4 kidneys one removed. Osteoporosis, obesity see medical chart    How long can you sit comfortably? 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  I have more problems painting    Patient Stated Goals I want to get rid of pain and be more flexible in my neck/shoulders    Currently in Pain?  Yes    Pain Score 3     Pain Location Neck    Pain Orientation Left    Pain Descriptors / Indicators Aching                [facility] Adult PT Treatment:                                                DATE: 07/30/2021  Therapeutic Exercise: Nu-step L5 x 5 min UE/LE  Upper trap stretch 2 x 30 sec on the L Standing chin tuck against wall pushing into ball 1 x 12 holding 5 seconds Horizontal abduction 1 x 15 with RTB  With ball between between shoulder blades Diagonal star abduction 1 x 15 bil with RTB Scapular  Ceiling punches holding blue ball bil 1 x 10 with bil UE  Supine marching alternating L/R keeping core tight 1 x 20  Manual Therapy: MTPR along the L upper trap/ levator scapulae Manual cervical traction with towel  PT Short Term Goals - 07/14/21 1224       PT SHORT TERM GOAL #1   Title Demonstrate understanding of neutral posture and be more conscious of position and posture throughout the day.    Baseline Pt more aware of posture and need for movement    Time 3    Period Weeks    Status Achieved    Target Date 07/14/21      PT SHORT TERM GOAL #2   Title Pt will be independent with intial HEP    Baseline Pt independent with intial and working  on advanced strength    Time 3    Period Weeks    Status Achieved    Target Date 07/14/21               PT Long Term Goals - 07/14/21 1225       PT LONG TERM GOAL #1   Title Pt will be independent with advanced HEP given    Baseline given advanced strength for the road    Time 6    Period Weeks    Status On-going    Target Date 08/04/21      PT LONG TERM GOAL #2   Title Improved cervical rotation to allow checking blind spots while driving with minimal discomfort    Baseline Pt able to turn neck better, sometimes using body for different road construction problems    Time 6    Period Weeks    Status On-going    Target Date 08/04/21      PT LONG TERM  GOAL #3   Title Pt will be able to paint  for an hour without neck/shoulder being exacerbated.    Baseline Pt has not tried to paint    Time 6    Period Weeks    Status On-going    Target Date 08/04/21      PT LONG TERM GOAL #4   Title Demonstrate and verbalize techniques to reduce the risk of re-injury including: lifting, posture, body mechanics.    Baseline discussiong today to use exercise and to care for neck on the road    Time 6    Period Weeks    Status On-going    Target Date 08/04/21      PT LONG TERM GOAL #5   Title FOTO will improve from 51%   to 60%    indicating improved functional mobility.    Baseline 51 % eval    Time 6    Period Weeks    Status Unable to assess    Target Date 08/04/21                   Plan - 07/30/21 1140     Clinical Impression Statement Pt arrives to session reporting continued stress per her situation at home with her husband's cardiovascualr needs. She continues to responded favorably to [facility] along the L upper trap/ levator scapulae followed with manual traction. Continued working on posterior shoulder strengthening which she completed with no rpeort of pain or issues. plan to reassess next session to assess need for continued treatment.    Comorbidities Hyperlidemia.  4 kidneys one removed. Osteoporosis, obesity see medical chart.    PT Treatment/Interventions Joint Manipulations;Passive range of motion;Dry needling;Taping;Manual techniques;Patient/family education;Neuromuscular re-education;Therapeutic exercise;Traction;Iontophoresis 4mg /ml Dexamethasone;Moist Heat;Electrical Stimulation;Cryotherapy    PT Next Visit Plan review strengthening exercise.  working on Designer, industrial/product for road .    PT Home Exercise Plan [email or web address]  Consulted and Agree with Plan of Care Patient             Patient will benefit from skilled therapeutic intervention in order to improve the following deficits and impairments:  Obesity, Pain,  Postural dysfunction, Improper body mechanics, Impaired UE functional use, Increased fascial restricitons, Hypomobility, Decreased strength, Decreased range of motion  Visit Diagnosis: No diagnosis found.     Problem List Patient Active Problem List   Diagnosis Date Noted   DOE (dyspnea on exertion) 03/02/2021   Sensation of chest pressure 03/02/2021   Hemiplegic migraine without status migrainosus, not intractable 03/02/2021   Intrinsic eczema 02/07/2019   Allergic contact dermatitis due to plants, except food 02/07/2019   Chronic renal disease, stage 3, moderately decreased glomerular filtration rate (GFR) between 30-59 mL/min/1.73 square meter ([place]) 01/17/2019   Visit for screening mammogram 01/16/2019   Class 1 obesity due to excess calories with serious comorbidity in adult 12/05/2017   Essential hypertension 12/05/2017   Estrogen deficiency 04/14/2016   Routine general medical examination at a health care facility 06/06/2013   Osteoporosis, senile 02/14/2009   Cerebral artery occlusion with cerebral infarction ([place]) 02/13/2009   Hyperlipidemia with target LDL less than 130 11/11/2008   Anxiety with depression 11/11/2008   Starr Lake PT, DPT, LAT, ATC  07/30/21  11:48 AM      [place] [facility] [address] [place], [place], [address] Phone: [phone]   Fax:  [phone]  Name: Ashley Cruz MRN: [id number] Date of Birth: Dec 06, 1945

## 2021-08-04 ENCOUNTER — Encounter: Payer: Self-pay | Admitting: Physical Therapy

## 2021-08-04 ENCOUNTER — Ambulatory Visit: Payer: Medicare Other | Admitting: Physical Therapy

## 2021-08-04 ENCOUNTER — Other Ambulatory Visit: Payer: Self-pay

## 2021-08-04 DIAGNOSIS — R293 Abnormal posture: Secondary | ICD-10-CM

## 2021-08-04 DIAGNOSIS — M542 Cervicalgia: Secondary | ICD-10-CM

## 2021-08-04 DIAGNOSIS — M6281 Muscle weakness (generalized): Secondary | ICD-10-CM | POA: Diagnosis not present

## 2021-08-04 NOTE — Therapy (Addendum)
[place], [place], [address] Phone: [phone]   Fax:  [phone]  Physical Therapy Treatment/ERO Progress Note Reporting Period 06/23/2021 to 08/04/2021  See note below for Objective Data and Assessment of Progress/Goals.      Patient Details  Name: Ashley Cruz MRN: [id number] Date of Birth: 1946/03/02 Referring Provider (PT): Binnie Rail MD   Encounter Date: 08/04/2021   PT End of Session - 08/04/21 1204     Visit Number 9    Number of Visits 15    Date for PT Re-Evaluation 09/15/21    Authorization Type [employer] [place] visit progress note    PT Start Time 1150    PT Stop Time 1238    PT Time Calculation (min) 48 min    Activity Tolerance Patient tolerated treatment well    Behavior During Therapy WFL for tasks assessed/performed             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   Subjective Assessment - 08/04/21 1151     Subjective I have a 3/10 pain in my  left shoulder.  I have been feeling better left shoulder. My husband has been in the hospital  and he wil get a pacemaker soon. I have not been working and just taking him to the hospital.  The surgery was a cardioversion and ablation but now I think they will do a pacemaker    Pertinent History  Hyperlidemia.  4 kidneys one removed. Osteoporosis, obesity see medical chart    How long can you sit comfortably? 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 unliimited    How long can you walk comfortably? I dont walk for exercise    Diagnostic tests x ray    Patient Stated Goals I want to get rid of pain and be more flexible in my neck/shoulders    Currently in Pain? Yes    Pain Score 3     Pain Location Neck    Pain Orientation Left    Pain Descriptors / Indicators Aching    Pain Type Chronic pain    Pain Onset More than a month ago    Pain Frequency Intermittent                [facility] PT Assessment - 08/04/21 0001       Assessment   Medical Diagnosis neck pain    Referring Provider (PT) Binnie Rail MD      Observation/Other Assessments   Focus on Therapeutic Outcomes (  FOTO)  63%      AROM   Cervical Flexion 38    Cervical Extension 41    Cervical - Right Side Bend 15    Cervical - Left Side Bend 17    Cervical - Right Rotation 45   post TPDN   Cervical - Left Rotation 40   post TPDN     Strength   Overall Strength Deficits    Right Shoulder Flexion 5/5    Right Shoulder ABduction 4+/5    Right Shoulder Internal Rotation 4+/5    Right Shoulder External Rotation 4/5    Left Shoulder Flexion 4+/5   pain   Left Shoulder ABduction 4+/5   pain   Left Shoulder Internal Rotation 4/5    Left Shoulder External Rotation 4/5    Right Hand Grip (lbs) 50    Left Hand Grip (lbs) 45                         [facility] Adult PT Treatment/Exercise - 08/04/21 0001       Manual Therapy   Manual Therapy Joint mobilization;Soft tissue mobilization    Manual therapy comments skilled palpation with TPDN    Joint Mobilization UPT left cervical C-3 to C-6 and then on R C-3 to C-6    Soft tissue mobilization bil cervical paraspinals and then bil upper trap and levator              Trigger  Point Dry Needling - 08/04/21 0001     Consent Given? Yes    Education Handout Provided Previously provided    Muscles Treated Head and Neck Upper trapezius;Levator scapulae;Cervical multifidi    Muscles Treated Upper Quadrant Rhomboids    Dry Needling Comments 50 mm .30 gage    Electrical Stimulation Performed with Dry Needling Yes    E-stim with Dry Needling Details 20 pps to pt tolerance for 8 minutes    Upper Trapezius Response Twitch reponse elicited;Palpable increased muscle length    Levator Scapulae Response Twitch response elicited;Palpable increased muscle length    Cervical multifidi Response Palpable increased muscle length    Rhomboids Response Twitch response elicited;Palpable increased muscle length             [facility] Adult PT Treatment:                                                DATE: 07/30/2021   Therapeutic Exercise:  OH press 2 x 10  15 # with chin tuck Upper trap stretch 2 x 30 sec on the L Diagonal star abduction 1 x 15 bil with RTB     Not done today in RX due to re eval and pt needed to answer emergency call Nu-step L5 x 5 min UE/LE Standing chin tuck against wall pushing into ball 1 x 12 holding 5 seconds Horizontal abduction 1 x 15 with RTB  With ball between between shoulder blades Diagonal star abduction 1 x 15 bil with RTB Scapular  Ceiling punches holding blue ball bil 1 x 10 with bil UE  Supine marching alternating L/R keeping core tight 1 x 20         PT Short Term Goals - 07/14/21 1224       PT SHORT TERM GOAL #1   Title Demonstrate understanding of neutral  posture and be more conscious of position and posture throughout the day.    Baseline Pt more aware of posture and need for movement    Time 3    Period Weeks    Status Achieved    Target Date 07/14/21      PT SHORT TERM GOAL #2   Title Pt will be independent with intial HEP    Baseline Pt independent with intial and working  on advanced strength    Time 3    Period Weeks     Status Achieved    Target Date 07/14/21               PT Long Term Goals - 08/04/21 1216       PT LONG TERM GOAL #1   Title Pt will be independent with advanced HEP given    Baseline given advanced strength for the road , lifting 15 # overhead    Time 6    Period Weeks    Status On-going    Target Date 09/15/21      PT LONG TERM GOAL #2   Title Improved cervical rotation to allow checking blind spots while driving with minimal discomfort    Baseline Pt able to turn neck better, sometimes using body for different road construction problems  See flow chart    Time 6    Period Weeks    Status On-going    Target Date 09/15/21      PT LONG TERM GOAL #3   Title Pt will be able to paint  for an hour without neck/shoulder being exacerbated.    Baseline Pt has not tried to paint due to husband in hospital    Time 6    Period Weeks    Status On-going    Target Date 09/15/21      PT LONG TERM GOAL #4   Title Demonstrate and verbalize techniques to reduce the risk of re-injury including: lifting, posture, body mechanics.    Baseline needs reinforcement for household chores    Time 6    Period Weeks    Status On-going    Target Date 09/15/21      PT LONG TERM GOAL #5   Title FOTO will improve from 51%   to 60%    indicating improved functional mobility.    Baseline 51 % eval  08-04-21 63%    Time 6    Period Weeks    Status Achieved    Target Date 09/15/21                   Plan - 08/04/21 1157     Clinical Impression Statement Pt returns today for ERO for cervicalgia to extend visits due to pain 3/10 and shoulder neck weakness needing additional skilled PT to address and educate on home management.  Ms Kulig has been stressed and busy caring for husband who has been hospitalized due to cardiac issue and pending surgeries to address.  Ms Felan has not been able to fully concentrate on her own care.  She has difficulty with cervical rotation to L and R about  40/45 degrees. Hand grip improved to R 50 lb /L 45 lb,  FOTO goal achieved to 63%   Pt has improved strength grossly from 4-/5 in neck and shoulder to at least 4/5 but will benefit from continued skilled PT to address deficits/pain and to educate/reinforce home management.    Personal Factors and Comorbidities Age;Comorbidity 2  Comorbidities Hyperlidemia.  4 kidneys one removed. Osteoporosis, obesity see medical chart.    Examination-Activity Limitations Lift;Other    Examination-Participation Restrictions Occupation;Other    Stability/Clinical Decision Making Evolving/Moderate complexity    Clinical Decision Making Moderate    Rehab Potential Good    PT Frequency 1x / week    PT Duration 6 weeks    PT Treatment/Interventions Joint Manipulations;Passive range of motion;Dry needling;Taping;Manual techniques;Patient/family education;Neuromuscular re-education;Therapeutic exercise;Traction;Iontophoresis 4mg /ml Dexamethasone;Moist Heat;Electrical Stimulation;Cryotherapy    PT Next Visit Plan continue progressive overload strength of  shld mx. working on independent techique for road/driving when husband is clear to drive after hospitalizations    PT Home Exercise Plan [email or web address]    Consulted and Agree with Plan of Care Patient             Patient will benefit from skilled therapeutic intervention in order to improve the following deficits and impairments:  Obesity, Pain, Postural dysfunction, Improper body mechanics, Impaired UE functional use, Increased fascial restricitons, Hypomobility, Decreased strength, Decreased range of motion  Visit Diagnosis: Cervicalgia  Muscle weakness (generalized)  Abnormal posture     Problem List Patient Active Problem List   Diagnosis Date Noted   DOE (dyspnea on exertion) 03/02/2021   Sensation of chest pressure 03/02/2021   Hemiplegic migraine without status migrainosus, not intractable 03/02/2021   Intrinsic eczema 02/07/2019   Allergic  contact dermatitis due to plants, except food 02/07/2019   Chronic renal disease, stage 3, moderately decreased glomerular filtration rate (GFR) between 30-59 mL/min/1.73 square meter ([place]) 01/17/2019   Visit for screening mammogram 01/16/2019   Class 1 obesity due to excess calories with serious comorbidity in adult 12/05/2017   Essential hypertension 12/05/2017   Estrogen deficiency 04/14/2016   Routine general medical examination at a health care facility 06/06/2013   Osteoporosis, senile 02/14/2009   Cerebral artery occlusion with cerebral infarction ([place]) 02/13/2009   Hyperlipidemia with target LDL less than 130 11/11/2008   Anxiety with depression 11/11/2008   Voncille Lo, PT, [place] Certified Exercise Expert for the Aging Adult  08/04/21 12:54 PM Phone: [phone] Fax: [place], [place], [address] Phone: [phone]   Fax:  [phone]  Name: Ashley Cruz MRN: [id number] Date of Birth: 10-22-1945      Starr [place] PT, DPT, LAT, ATC  08/13/21  10:20 AM

## 2021-08-06 ENCOUNTER — Ambulatory Visit: Payer: Medicare Other | Admitting: Physical Therapy

## 2021-08-13 ENCOUNTER — Other Ambulatory Visit: Payer: Self-pay

## 2021-08-13 ENCOUNTER — Ambulatory Visit: Payer: Medicare Other | Admitting: Physical Therapy

## 2021-08-13 ENCOUNTER — Encounter: Payer: Self-pay | Admitting: Physical Therapy

## 2021-08-13 DIAGNOSIS — M542 Cervicalgia: Secondary | ICD-10-CM | POA: Diagnosis not present

## 2021-08-13 DIAGNOSIS — R293 Abnormal posture: Secondary | ICD-10-CM

## 2021-08-13 DIAGNOSIS — M6281 Muscle weakness (generalized): Secondary | ICD-10-CM

## 2021-08-13 NOTE — Therapy (Addendum)
[place], [place], [address] Phone: [phone]   Fax:  [phone]  Physical Therapy Treatment  Patient Details  Name: Ashley Cruz MRN: [id number] Date of Birth: February 20, 1946 Referring Provider (PT): Binnie Rail MD   Encounter Date: 08/13/2021   PT End of Session - 08/13/21 1018     Visit Number 10    Number of Visits 16    Date for PT Re-Evaluation 09/15/21    Progress Note Due on Visit 98    PT Start Time 1019    PT Stop Time 1100    PT Time Calculation (min) 41 min    Activity Tolerance Patient tolerated treatment well    Behavior During Therapy [facility] for tasks assessed/performed             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   Subjective Assessment - 08/13/21 1022     Subjective I fell and hit my head on the coffee table on Monday, I just felt weak and suddenly fell.    Pain Score 3     Pain Orientation Left;Right              [facility] Adult PT Treatment:                                                DATE: 08/13/2021  Therapeutic Exercise: Nustep level 6 x 8 min UE/LE Diagonal star abduction RTB 1 x15 Upper trap stretch 2 x 30 sec bil Horizontal abduction 2 x15 with RTB Supine marching alternating L/R cues to keep core tight 1 x25 Prone W,  scaption 1 x 15 each Manual Therapy: STM to R upper trap IASTM post TPDN to R upper trap TPDN to R upper trap, performed by licensed therapist Carlus Pavlov                          PT Short Term Goals - 07/14/21 1224       PT SHORT TERM GOAL #1   Title Demonstrate understanding of neutral posture and be more conscious of position and posture throughout the day.    Baseline Pt more aware of posture and need for movement    Time 3    Period Weeks    Status Achieved    Target Date 07/14/21      PT SHORT TERM GOAL #2   Title Pt will be independent with intial HEP    Baseline Pt independent with  intial and working  on advanced strength    Time 3    Period Weeks    Status Achieved    Target Date 07/14/21               PT Long Term Goals - 08/04/21 1216       PT LONG TERM GOAL #1   Title Pt will be independent with advanced HEP given    Baseline given advanced strength for the road , lifting 15 # overhead    Time 6    Period Weeks    Status On-going    Target Date 09/15/21      PT LONG TERM GOAL #2   Title Improved cervical rotation to allow checking blind spots while driving with minimal discomfort    Baseline Pt able to turn neck better, sometimes using body for different road construction problems  See flow chart    Time 6    Period Weeks    Status On-going    Target Date 09/15/21      PT LONG TERM GOAL #3   Title Pt will be able to paint  for an hour without neck/shoulder being exacerbated.    Baseline Pt has not tried to paint due to husband in hospital    Time 6    Period Weeks    Status On-going    Target Date 09/15/21      PT LONG TERM GOAL #4   Title Demonstrate and verbalize techniques to reduce the risk of re-injury including: lifting, posture, body mechanics.    Baseline needs reinforcement for household chores    Time 6    Period Weeks    Status On-going    Target Date 09/15/21      PT LONG TERM GOAL #5   Title FOTO will  improve from 51%   to 60%    indicating improved functional mobility.    Baseline 51 % eval  08-04-21 63%    Time 6    Period Weeks    Status Achieved    Target Date 09/15/21                   Plan - 08/13/21 1043     Clinical Impression Statement Patient reports 3/10 shoulder pain today and states that she fell and hit her head on Monday but that it didn't exacerbate her neck/shoulder pain. She is showing improvement in her shoulder/neck ROM and strength and overall decreased pain. Her husband is getting his pacemaker on 2/8 and is hopeful for a good outcome which will help decrease her stress levels. She responded well to TPDN and IASTM on R upper trap, noting dereased tension afterwards. Patient will continue to benefit from skilled PT to progress towards remaining goals.    PT Treatment/Interventions Joint Manipulations;Passive range of motion;Dry needling;Taping;Manual techniques;Patient/family education;Neuromuscular re-education;Therapeutic exercise;Traction;Iontophoresis 4mg /ml Dexamethasone;Moist Heat;Electrical Stimulation;Cryotherapy    PT Next Visit Plan continue progressive overload strength of  shld mx. working on independent techique for road/driving when husband is clear to drive after hospitalizations             Patient will benefit from skilled therapeutic intervention in order to improve the following deficits and impairments:  Obesity, Pain, Postural dysfunction, Improper body mechanics, Impaired UE functional use, Increased fascial restricitons, Hypomobility, Decreased strength, Decreased range of motion  Visit Diagnosis: Cervicalgia  Muscle weakness (generalized)  Abnormal posture     Problem List Patient Active Problem List   Diagnosis Date Noted  DOE (dyspnea on exertion) 03/02/2021   Sensation of chest pressure 03/02/2021   Hemiplegic migraine without status migrainosus, not intractable 03/02/2021   Intrinsic eczema 02/07/2019   Allergic  contact dermatitis due to plants, except food 02/07/2019   Chronic renal disease, stage 3, moderately decreased glomerular filtration rate (GFR) between 30-59 mL/min/1.73 square meter ([place]) 01/17/2019   Visit for screening mammogram 01/16/2019   Class 1 obesity due to excess calories with serious comorbidity in adult 12/05/2017   Essential hypertension 12/05/2017   Estrogen deficiency 04/14/2016   Routine general medical examination at a health care facility 06/06/2013   Osteoporosis, senile 02/14/2009   Cerebral artery occlusion with cerebral infarction ([place]) 02/13/2009   Hyperlipidemia with target LDL less than 130 11/11/2008   Anxiety with depression 11/11/2008    Evelene Croon, PTA 08/13/21 4:27 PM   [place] [facility] [address] [place], [place], [address] Phone: [phone]   Fax:  [phone]  Name: Ashley Cruz MRN: [id number] Date of Birth: Nov 28, 1945

## 2021-08-18 ENCOUNTER — Ambulatory Visit: Payer: Medicare Other | Admitting: Physical Therapy

## 2021-08-18 ENCOUNTER — Other Ambulatory Visit: Payer: Self-pay

## 2021-08-18 ENCOUNTER — Encounter: Payer: Self-pay | Admitting: Physical Therapy

## 2021-08-18 DIAGNOSIS — M6281 Muscle weakness (generalized): Secondary | ICD-10-CM | POA: Diagnosis not present

## 2021-08-18 DIAGNOSIS — R293 Abnormal posture: Secondary | ICD-10-CM

## 2021-08-18 DIAGNOSIS — M542 Cervicalgia: Secondary | ICD-10-CM | POA: Diagnosis not present

## 2021-08-18 NOTE — Therapy (Signed)
[place], [place], [address] Phone: [phone]   Fax:  [phone]  Physical Therapy Treatment  Patient Details  Name: Ashley Cruz MRN: [id number] Date of Birth: 1945/11/21 Referring Provider (PT): Binnie Rail MD   Encounter Date: 08/18/2021   PT End of Session - 08/18/21 0913     Visit Number 11    Number of Visits 16    Date for PT Re-Evaluation 09/15/21    Authorization Type [employer] [facility] visit progress note    Progress Note Due on Visit 24    PT Start Time 0850    PT Stop Time 0930    PT Time Calculation (min) 40 min    Activity Tolerance Patient tolerated treatment well    Behavior During Therapy [facility] for tasks assessed/performed             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   Subjective Assessment - 08/18/21 0855     Subjective I have not fallen this week    Pertinent History Hyperlidemia.  4 kidneys one removed. Osteoporosis, obesity see medical chart    How long can you sit comfortably? 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 unlimited    How long can you walk comfortably? I still dont walk for exercise    Diagnostic tests x ray    Patient Stated Goals I want to get  rid of pain and be more flexible in my neck/shoulders    Currently in Pain? Yes    Pain Score 4     Pain Location Neck    Pain Orientation Left;Right    Pain Descriptors / Indicators Aching    Pain Type Chronic pain    Pain Onset More than a month ago    Pain Frequency Intermittent              [facility] Adult PT Treatment:                                                DATE: 08-18-21   Therapeutic Exercise: Nustep level 6 x 8 min UE/LE Diagonal star abduction RTB 1 x 20 Horizontal abduction 2 x15 with RTB OH press with 15 # DB 2 x 10 Box squat with 15 # DB 2 x 10 from chair Supine marching alternating L/R cues to keep core tight 1 x25 Prone W, scaption 1  x 15 each Prone T with elongated neck 1 x 15 Upper trap stretch 2 x 30 sec bil Levator stretch 2 x 30 sec bil SELF CARE  Demo lifting technique,  Sitting and standing and sleeping posture  Walking for health 10000 steps A DAY  Adl,pain management, body mechanics household chores                         PT Education - 08/18/21 0922     Education Details Pt educated on posture/body mechanics and ADL protection of neck and backi lifting demo and walking for health    Person(s) Educated Patient    Methods Explanation;Demonstration;Tactile cues;Verbal cues    Comprehension Verbalized understanding;Returned demonstration              PT Short Term Goals - 07/14/21 1224       PT SHORT TERM GOAL #1   Title Demonstrate understanding of neutral posture and be more conscious of position and posture throughout the day.    Baseline Pt more aware of posture and need for movement    Time 3    Period Weeks    Status Achieved    Target Date 07/14/21      PT SHORT TERM GOAL #2   Title Pt will be independent with intial HEP    Baseline Pt independent with intial and working  on advanced strength    Time 3    Period Weeks    Status Achieved    Target Date 07/14/21               PT Long Term  Goals - 08/04/21 1216       PT LONG TERM GOAL #1   Title Pt will be independent with advanced HEP given    Baseline given advanced strength for the road , lifting 15 # overhead    Time 6    Period Weeks    Status On-going    Target Date 09/15/21      PT LONG TERM GOAL #2   Title Improved cervical rotation to allow checking blind spots while driving with minimal discomfort    Baseline Pt able to turn neck better, sometimes using body for different road construction problems  See flow chart    Time 6    Period Weeks    Status On-going    Target Date 09/15/21      PT LONG TERM GOAL #3   Title Pt will be able to paint  for an hour without neck/shoulder being exacerbated.    Baseline Pt has not tried to paint due to husband in hospital    Time 6    Period Weeks    Status On-going    Target Date 09/15/21      PT LONG TERM GOAL #4   Title Demonstrate and verbalize techniques to reduce the risk of re-injury including: lifting, posture, body mechanics.    Baseline needs reinforcement for household chores    Time 6    Period Weeks    Status On-going    Target Date 09/15/21      PT LONG TERM GOAL #5   Title FOTO will improve from 51%   to 60%    indicating improved functional mobility.    Baseline 51 % eval  08-04-21 63%    Time 6    Period Weeks    Status Achieved    Target Date 09/15/21  Plan - 08/18/21 0915     Clinical Impression Statement Pt reports 4/10 pain at beginning of session.  Pt after exercise 3/10.  Pt has not residual pain from falling and hitting her head when she passed out previous to last visit.  Pt husband to recieve pacemaker on February 8th.  Pt admits she is not walking or doing routine exercises for walking but she is doing her HEP from PT.  Pt declined TPDN today and chose to work on exercises to improve strength and build reserve. Pt educated on lifting. body mechanics posture, walking for health.  Pt has 3 appt to reinforce HEP  she can use on the road to improve strength before DC.    Personal Factors and Comorbidities Age;Comorbidity 2    Comorbidities Hyperlidemia.  4 kidneys one removed. Osteoporosis, obesity see medical chart.    Examination-Activity Limitations Lift;Other    Examination-Participation Restrictions Occupation;Other    PT Frequency 1x / week    PT Duration 6 weeks    PT Treatment/Interventions Joint Manipulations;Passive range of motion;Dry needling;Taping;Manual techniques;Patient/family education;Neuromuscular re-education;Therapeutic exercise;Traction;Iontophoresis 4mg /ml Dexamethasone;Moist Heat;Electrical Stimulation;Cryotherapy    PT Next Visit Plan continue progressive overload strength of  shld mx/core. working on Psychologist, clinical for road/driving when husband is clear to drive after hospitalizations.  REinforce and progress HEP in next 3 visits for home management of strength and pain management    PT Home Exercise Plan [email or web address]    Consulted and Agree with Plan of Care Patient             Patient will benefit from skilled therapeutic intervention in order to improve the following deficits and impairments:  Obesity, Pain, Postural dysfunction, Improper body mechanics, Impaired UE functional use, Increased fascial restricitons, Hypomobility, Decreased strength, Decreased range of motion  Visit Diagnosis: Cervicalgia  Muscle weakness (generalized)  Abnormal posture     Problem List Patient Active Problem List   Diagnosis Date Noted   DOE (dyspnea on exertion) 03/02/2021   Sensation of chest pressure 03/02/2021   Hemiplegic migraine without status migrainosus, not intractable 03/02/2021   Intrinsic eczema 02/07/2019   Allergic contact dermatitis due to plants, except food 02/07/2019   Chronic renal disease, stage 3, moderately decreased glomerular filtration rate (GFR) between 30-59 mL/min/1.73 square meter ([place]) 01/17/2019   Visit for screening mammogram 01/16/2019    Class 1 obesity due to excess calories with serious comorbidity in adult 12/05/2017   Essential hypertension 12/05/2017   Estrogen deficiency 04/14/2016   Routine general medical examination at a health care facility 06/06/2013   Osteoporosis, senile 02/14/2009   Cerebral artery occlusion with cerebral infarction ([place]) 02/13/2009   Hyperlipidemia with target LDL less than 130 11/11/2008   Anxiety with depression 11/11/2008   Voncille Lo, PT, [place] Certified Exercise Expert for the Aging Adult  08/18/21 9:35 AM Phone: [phone] Fax: [place], [place], [address] Phone: [phone]   Fax:  [phone]  Name: Ashley Cruz MRN: [id number] Date of Birth: 04-Jul-1946

## 2021-08-18 NOTE — Patient Instructions (Signed)
Sleeping on Back  Place pillow under knees. A pillow with cervical support and a roll around waist are also helpful. Copyright  VHI. All rights reserved.  Sleeping on Side Place pillow between knees. Use cervical support under neck and a roll around waist as needed. Copyright  VHI. All rights reserved.   Sleeping on Stomach   If this is the only desirable sleeping position, place pillow under lower legs, and under stomach or chest as needed.  Posture - Sitting   Sit upright, head facing forward. Try using a roll to support lower back. Keep shoulders relaxed, and avoid rounded back. Keep hips level with knees. Avoid crossing legs for long periods. Stand to Sit / Sit to Stand   To sit: Bend knees to lower self onto front edge of chair, then scoot back on seat. To stand: Reverse sequence by placing one foot forward, and scoot to front of seat. Use rocking motion to stand up.   Work Height and Reach  Ideal work height is no more than 2 to 4 inches below elbow level when standing, and at elbow level when sitting. Reaching should be limited to arm's length, with elbows slightly bent.  Bending  Bend at hips and knees, not back. Keep feet shoulder-width apart.    Posture - Standing   Good posture is important. Avoid slouching and forward head thrust. Maintain curve in low back and align ears over shoul- ders, hips over ankles.  Alternating Positions   Alternate tasks and change positions frequently to reduce fatigue and muscle tension. Take rest breaks. Computer Work   Position work to Programmer, multimedia. Use proper work and seat height. Keep shoulders back and down, wrists straight, and elbows at right angles. Use chair that provides full back support. Add footrest and lumbar roll as needed.  Getting Into / Out of Car  Lower self onto seat, scoot back, then bring in one leg at a time. Reverse sequence to get out.  Dressing  Lie on back to pull socks or slacks over feet, or sit  and bend leg while keeping back straight.    Housework - Sink  Place one foot on ledge of cabinet under sink when standing at sink for prolonged periods.   Pushing / Pulling  Pushing is preferable to pulling. Keep back in proper alignment, and use leg muscles to do the work.  Deep Squat   Squat and lift with both arms held against upper trunk. Tighten stomach muscles without holding breath. Use smooth movements to avoid jerking.  Avoid Twisting   Avoid twisting or bending back. Pivot around using foot movements, and bend at knees if needed when reaching for articles.  Carrying Luggage   Distribute weight evenly on both sides. Use a cart whenever possible. Do not twist trunk. Move body as a unit.   Lifting Principles Maintain proper posture and head alignment. Slide object as close as possible before lifting. Move obstacles out of the way. Test before lifting; ask for help if too heavy. Tighten stomach muscles without holding breath. Use smooth movements; do not jerk. Use legs to do the work, and pivot with feet. Distribute the work load symmetrically and close to the center of trunk. Push instead of pull whenever possible.   Ask For Help   Ask for help and delegate to others when possible. Coordinate your movements when lifting together, and maintain the low back curve.  Log Roll   Lying on back, bend left knee and place left  arm across chest. Roll all in one movement to the right. Reverse to roll to the left. Always move as one unit. Housework - Sweeping  Use long-handled equipment to avoid stooping.   Housework - Wiping  Position yourself as close as possible to reach work surface. Avoid straining your back.  Laundry - Unloading Wash   To unload small items at bottom of washer, lift leg opposite to arm being used to reach.  [place] close to area to be raked. Use arm movements to do the work. Keep back straight and avoid twisting.      Cart  When reaching into cart with one arm, lift opposite leg to keep back straight.   Getting Into / Out of Bed  Lower self to lie down on one side by raising legs and lowering head at the same time. Use arms to assist moving without twisting. Bend both knees to roll onto back if desired. To sit up, start from lying on side, and use same move-ments in reverse. Housework - Vacuuming  Hold the vacuum with arm held at side. Step back and forth to move it, keeping head up. Avoid twisting.   Laundry - IT consultant so that bending and twisting can be avoided.   Laundry - Unloading Dryer  Squat down to reach into clothes dryer or use a reacher.  Gardening - Weeding / Probation officer or Kneel. Knee pads may be helpful.                   Voncille Lo, PT, [place] Certified Exercise Expert for the Aging Adult  08/18/21 9:22 AM Phone: [phone] Fax: [phone]

## 2021-08-25 ENCOUNTER — Ambulatory Visit: Payer: Medicare Other | Admitting: Physical Therapy

## 2021-08-27 ENCOUNTER — Other Ambulatory Visit: Payer: Self-pay | Admitting: Internal Medicine

## 2021-08-27 DIAGNOSIS — N183 Chronic kidney disease, stage 3 unspecified: Secondary | ICD-10-CM

## 2021-08-27 DIAGNOSIS — I10 Essential (primary) hypertension: Secondary | ICD-10-CM

## 2021-08-28 ENCOUNTER — Encounter: Payer: Self-pay | Admitting: Internal Medicine

## 2021-08-28 DIAGNOSIS — N183 Chronic kidney disease, stage 3 unspecified: Secondary | ICD-10-CM

## 2021-08-28 DIAGNOSIS — I10 Essential (primary) hypertension: Secondary | ICD-10-CM

## 2021-08-31 ENCOUNTER — Other Ambulatory Visit: Payer: Self-pay | Admitting: Internal Medicine

## 2021-08-31 ENCOUNTER — Encounter: Payer: Self-pay | Admitting: Internal Medicine

## 2021-08-31 DIAGNOSIS — I10 Essential (primary) hypertension: Secondary | ICD-10-CM

## 2021-08-31 DIAGNOSIS — N183 Chronic kidney disease, stage 3 unspecified: Secondary | ICD-10-CM

## 2021-08-31 MED ORDER — IRBESARTAN 150 MG PO TABS: 150.0000 mg | ORAL_TABLET | Freq: Every day | ORAL | 0 refills | Status: DC
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
## 2021-09-01 ENCOUNTER — Ambulatory Visit: Payer: Medicare Other | Attending: Internal Medicine | Admitting: Physical Therapy

## 2021-09-01 ENCOUNTER — Other Ambulatory Visit: Payer: Self-pay

## 2021-09-01 ENCOUNTER — Encounter: Payer: Self-pay | Admitting: Physical Therapy

## 2021-09-01 DIAGNOSIS — M542 Cervicalgia: Secondary | ICD-10-CM | POA: Diagnosis not present

## 2021-09-01 DIAGNOSIS — M6281 Muscle weakness (generalized): Secondary | ICD-10-CM | POA: Diagnosis not present

## 2021-09-01 DIAGNOSIS — R293 Abnormal posture: Secondary | ICD-10-CM | POA: Diagnosis not present

## 2021-09-01 NOTE — Therapy (Signed)
[facility] [place], [place], [address] Phone: [phone]   Fax:  [phone]  Physical Therapy Treatment / Discharge  Patient Details  Name: Ashley Cruz MRN: [id number] Date of Birth: [birth year] Referring Provider (PT): Binnie Rail MD   Encounter Date: 09/01/2021   PT End of Session - 09/01/21 1018     Visit Number 12    Number of Visits 16    Date for PT Re-Evaluation 09/15/21    Authorization Type [employer] [place] visit progress note    Progress Note Due on Visit 82    PT Start Time 1017    PT Stop Time 1055    PT Time Calculation (min) 38 min    Activity Tolerance Patient tolerated treatment well    Behavior During Therapy WFL for tasks assessed/performed             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   Subjective Assessment - 09/01/21 1018     Subjective "I am down 12 lbs and my back feels better."    Patient Stated Goals I want to get rid of pain and be more flexible in my neck/shoulders    Currently in Pain? Yes    Pain Score 2     Pain Location Neck    Pain Orientation Left    Pain Descriptors / Indicators Tightness    Pain Type Chronic pain    Pain Onset In the past 7 days    Pain Frequency Once a week    Aggravating Factors  driving    Pain  Relieving Factors heat, tylenol                [facility] PT Assessment - 09/01/21 0001       Assessment   Medical Diagnosis neck pain    Referring Provider (PT) Binnie Rail MD      Observation/Other Assessments   Focus on Therapeutic Outcomes (FOTO)  66%      AROM   Cervical - Right Rotation 50    Cervical - Left Rotation 55                            .[facility] Adult PT Treatment:                                                DATE: 09/01/2021  Therapeutic Exercise: Reviewed HEP extensively Manual Therapy: MTPR along the L upper trap/  levator scapulae, sub-occipital release Self Care: Reviewed posture and lifting mechanics which she verbalize efficient knowledge of          PT Short Term Goals - 09/01/21 1024       PT SHORT TERM GOAL #1   Title Demonstrate understanding of neutral posture and be more conscious of position and posture throughout the day.    Status Achieved      PT SHORT TERM GOAL #2   Title Pt will be independent with intial HEP    Status Achieved               PT Long Term Goals - 09/01/21 1025       PT LONG TERM GOAL #1   Title Pt will be independent with advanced HEP given      PT LONG TERM GOAL #2   Title Improved cervical rotation to allow checking blind spots while driving with minimal discomfort    Period Weeks    Status Achieved      PT LONG TERM GOAL #3   Title Pt will be able to paint  for an hour without neck/shoulder being exacerbated.    Baseline hasn't tried painting, thinks she is able to do about 45 min    Status Achieved      PT LONG TERM GOAL #4   Title Demonstrate and verbalize techniques to reduce the risk of re-injury including: lifting, posture, body mechanics.    Period Weeks    Status Achieved      PT LONG TERM GOAL #5   Title FOTO will improve from 51%   to 60%    indicating improved functional mobility.    Status Achieved                   Plan - 09/01/21 1032      Clinical Impression Statement Mrs Ovitt arrives to session reporting significant improvement in the neck and in her back.  She has made great progress toward her goals. Reivewed posture, HEP and self trigger opint release techniques. Finished session with MTRP for the L upper trap/ levator scapulae and sub-occipital release. pt is able to maintain and progress her current LOF and will be discharged from PT today.    Comorbidities Hyperlidemia.  4 kidneys one removed. Osteoporosis, obesity see medical chart.    PT Treatment/Interventions Joint Manipulations;Passive range of motion;Dry needling;Taping;Manual techniques;Patient/family education;Neuromuscular re-education;Therapeutic exercise;Traction;Iontophoresis 78m/ml Dexamethasone;Moist Heat;Electrical Stimulation;Cryotherapy    PT Next Visit Plan continue progressive overload strength of  shld mx/core. 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  REinforce and progress HEP in next 3 visits for home management of strength and pain management    PT Home Exercise Plan [email or web address]    Consulted and Agree with Plan of Care Patient             Patient will benefit from skilled therapeutic intervention in order to improve the following deficits and impairments:  Obesity, Pain, Postural dysfunction, Improper body mechanics, Impaired UE functional use, Increased fascial restricitons, Hypomobility, Decreased strength, Decreased range of motion  Visit Diagnosis: Cervicalgia  Muscle weakness (generalized)  Abnormal posture     Problem List Patient Active Problem List   Diagnosis Date Noted   DOE (dyspnea on exertion) 03/02/2021   Sensation of chest pressure 03/02/2021   Hemiplegic migraine without status migrainosus, not intractable 03/02/2021   Intrinsic eczema 02/07/2019   Allergic contact dermatitis due to plants, except food  02/07/2019   Chronic renal disease, stage 3, moderately  decreased glomerular filtration rate (GFR) between 30-59 mL/min/1.73 square meter (HCC) 01/17/2019   Class 1 obesity due to excess calories with serious comorbidity in adult 12/05/2017   Essential hypertension 12/05/2017   Estrogen deficiency 04/14/2016   Routine general medical examination at a health care facility 06/06/2013   Osteoporosis, senile 02/14/2009   Cerebral artery occlusion with cerebral infarction ([place]) 02/13/2009   Hyperlipidemia with target LDL less than 130 11/11/2008   Anxiety with depression 11/11/2008   Starr Lake PT, DPT, LAT, ATC  09/01/21  11:35 AM      [facility] [address] [place], [place], [address] Phone: [phone]   Fax:  [phone]  Name: AMBERLYN MARTINEZGARCIA MRN: [id number] Date of Birth: 1946-06-12     PHYSICAL THERAPY DISCHARGE SUMMARY  Visits from Start of Care: 12  Current functional level related to goals / functional outcomes: See goals, FOTO 66%   Remaining deficits: See assessment   Education / Equipment: HEP, posture, lifting mechanics, theraband   Patient agrees to discharge. Patient goals were met. Patient is being discharged due to meeting the stated rehab goals.

## 2021-09-02 ENCOUNTER — Ambulatory Visit (INDEPENDENT_AMBULATORY_CARE_PROVIDER_SITE_OTHER): Payer: Medicare Other | Admitting: Internal Medicine

## 2021-09-02 VITALS — BP 114/76 | HR 69 | Temp 97.9°F | Ht 64.0 in | Wt 157.0 lb

## 2021-09-02 DIAGNOSIS — E785 Hyperlipidemia, unspecified: Secondary | ICD-10-CM

## 2021-09-02 DIAGNOSIS — I10 Essential (primary) hypertension: Secondary | ICD-10-CM | POA: Diagnosis not present

## 2021-09-02 DIAGNOSIS — F418 Other specified anxiety disorders: Secondary | ICD-10-CM | POA: Diagnosis not present

## 2021-09-02 DIAGNOSIS — N1831 Chronic kidney disease, stage 3a: Secondary | ICD-10-CM

## 2021-09-02 DIAGNOSIS — G43409 Hemiplegic migraine, not intractable, without status migrainosus: Secondary | ICD-10-CM

## 2021-09-02 DIAGNOSIS — M542 Cervicalgia: Secondary | ICD-10-CM

## 2021-09-02 MED ORDER — IRBESARTAN 150 MG PO TABS: 150.0000 mg | ORAL_TABLET | Freq: Every day | ORAL | 1 refills | Status: DC

## 2021-09-02 NOTE — Assessment & Plan Note (Addendum)
Chronic Controlled, Stable Continue Lexapro 10 mg daily 

## 2021-09-02 NOTE — Assessment & Plan Note (Signed)
Chronic Last GFR was normal CMP Stressed importance of continuing increased fluid intake, avoiding NSAIDs

## 2021-09-02 NOTE — Assessment & Plan Note (Addendum)
Chronic Blood pressure well controlled, but may be too well controlled.  She did have an episode of syncope that sounded like it was orthostatic in nature and she does get lightheadedness when she stands CMP She will try irbesartan 75 mg daily and monitor her blood pressure closely

## 2021-09-02 NOTE — Assessment & Plan Note (Addendum)
Chronic Occasional migraines Controlled with current medication as needed Continue Imitrex 50 mg as needed for migraine

## 2021-09-02 NOTE — Patient Instructions (Addendum)
° ° ° °  Blood work was ordered.     Medications changes include :   try the irbesartan 150 mg daily   Your prescription(s) have been sent to your pharmacy.     Return in about 6 months (around 03/02/2022) for follow up with pcp.
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
## 2021-09-02 NOTE — Assessment & Plan Note (Signed)
Chronic Regular exercise and healthy diet encouraged Check lipid panel  Continue atorvastatin 40 mg daily 

## 2021-09-03 ENCOUNTER — Encounter: Payer: Self-pay | Admitting: Internal Medicine

## 2021-09-03 DIAGNOSIS — M549 Dorsalgia, unspecified: Secondary | ICD-10-CM

## 2021-09-03 LAB — COMPREHENSIVE METABOLIC PANEL
ALT: 28 U/L (ref 0–35)
AST: 22 U/L (ref 0–37)
Albumin: 4.7 g/dL (ref 3.5–5.2)
Alkaline Phosphatase: 83 U/L (ref 39–117)
BUN: 20 mg/dL (ref 6–23)
CO2: 31 mEq/L (ref 19–32)
Calcium: 9.8 mg/dL (ref 8.4–10.5)
Chloride: 105 mEq/L (ref 96–112)
Creatinine, Ser: 0.9 mg/dL (ref 0.40–1.20)
GFR: 63.01 mL/min (ref >60.00)
Glucose, Bld: 76 mg/dL (ref 70–99)
Potassium: 4.3 mEq/L (ref 3.5–5.1)
Sodium: 140 mEq/L (ref 135–145)
Total Bilirubin: 0.7 mg/dL (ref 0.2–1.2)
Total Protein: 7.6 g/dL (ref 6.0–8.3)

## 2021-09-03 LAB — LIPID PANEL
Cholesterol: 153 mg/dL (ref 0–200)
HDL: 57.1 mg/dL (ref >39.00)
LDL Cholesterol: 78 mg/dL (ref 0–99)
NonHDL: 96.01
Total CHOL/HDL Ratio: 3
Triglycerides: 91 mg/dL (ref 0.0–149.0)
VLDL: 18.2 mg/dL (ref 0.0–40.0)

## 2021-09-04 ENCOUNTER — Encounter: Payer: Self-pay | Admitting: Internal Medicine

## 2021-09-06 ENCOUNTER — Encounter: Payer: Self-pay | Admitting: Sports Medicine

## 2021-09-06 MED ORDER — IRBESARTAN 75 MG PO TABS: 75.0000 mg | ORAL_TABLET | Freq: Every day | ORAL | 1 refills | Status: DC

## 2021-09-07 ENCOUNTER — Other Ambulatory Visit: Payer: Self-pay

## 2021-09-07 ENCOUNTER — Other Ambulatory Visit: Payer: Self-pay | Admitting: Sports Medicine

## 2021-09-07 ENCOUNTER — Ambulatory Visit: Payer: Medicare Other | Admitting: Sports Medicine

## 2021-09-07 VITALS — BP 132/80 | HR 62 | Ht 64.0 in | Wt 160.0 lb

## 2021-09-07 DIAGNOSIS — M9908 Segmental and somatic dysfunction of rib cage: Secondary | ICD-10-CM | POA: Diagnosis not present

## 2021-09-07 DIAGNOSIS — S46811A Strain of other muscles, fascia and tendons at shoulder and upper arm level, right arm, initial encounter: Secondary | ICD-10-CM

## 2021-09-07 DIAGNOSIS — M9901 Segmental and somatic dysfunction of cervical region: Secondary | ICD-10-CM | POA: Diagnosis not present

## 2021-09-07 DIAGNOSIS — S46812A Strain of other muscles, fascia and tendons at shoulder and upper arm level, left arm, initial encounter: Secondary | ICD-10-CM

## 2021-09-07 DIAGNOSIS — M542 Cervicalgia: Secondary | ICD-10-CM

## 2021-09-07 DIAGNOSIS — M9902 Segmental and somatic dysfunction of thoracic region: Secondary | ICD-10-CM | POA: Diagnosis not present

## 2021-09-07 MED ORDER — MELOXICAM 15 MG PO TABS: 15.0000 mg | ORAL_TABLET | Freq: Every day | ORAL | 0 refills | Status: DC
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
## 2021-09-07 NOTE — Patient Instructions (Addendum)
Good to see you  - Start meloxicam 15 mg daily x2 weeks.  If still having pain after 2 weeks, complete 3rd-week of meloxicam. May use remaining meloxicam as needed once daily for pain control.  Do not to use additional NSAIDs while taking meloxicam.  May use Tylenol [phone] mg to 3 times a day for breakthrough pain. Trap HEP 4 week follow up

## 2021-09-08 ENCOUNTER — Encounter: Payer: Self-pay | Admitting: Internal Medicine

## 2021-09-08 NOTE — Telephone Encounter (Signed)
FYI

## 2021-09-16 ENCOUNTER — Encounter: Payer: Self-pay | Admitting: Sports Medicine
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
## 2021-09-22 ENCOUNTER — Other Ambulatory Visit: Payer: Self-pay

## 2021-09-22 ENCOUNTER — Ambulatory Visit (INDEPENDENT_AMBULATORY_CARE_PROVIDER_SITE_OTHER): Payer: Medicare Other | Admitting: Sports Medicine

## 2021-09-22 VITALS — BP 122/72 | HR 70 | Ht 64.0 in | Wt 153.0 lb

## 2021-09-22 DIAGNOSIS — S46812D Strain of other muscles, fascia and tendons at shoulder and upper arm level, left arm, subsequent encounter: Secondary | ICD-10-CM | POA: Diagnosis not present

## 2021-09-22 DIAGNOSIS — M9901 Segmental and somatic dysfunction of cervical region: Secondary | ICD-10-CM

## 2021-09-22 DIAGNOSIS — M9908 Segmental and somatic dysfunction of rib cage: Secondary | ICD-10-CM | POA: Diagnosis not present

## 2021-09-22 DIAGNOSIS — M9902 Segmental and somatic dysfunction of thoracic region: Secondary | ICD-10-CM

## 2021-09-22 DIAGNOSIS — M542 Cervicalgia: Secondary | ICD-10-CM

## 2021-09-22 NOTE — Patient Instructions (Addendum)
Good to see you 2 week follow up  

## 2021-10-01 ENCOUNTER — Other Ambulatory Visit: Payer: Self-pay | Admitting: Sports Medicine

## 2021-10-05 ENCOUNTER — Ambulatory Visit: Payer: Medicare Other | Admitting: Sports Medicine

## 2021-10-05 ENCOUNTER — Other Ambulatory Visit: Payer: Self-pay | Admitting: Sports Medicine

## 2021-10-05 ENCOUNTER — Encounter: Payer: Self-pay | Admitting: Sports Medicine
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
## 2021-10-06 ENCOUNTER — Ambulatory Visit: Payer: Medicare Other | Admitting: Sports Medicine

## 2021-10-06 ENCOUNTER — Other Ambulatory Visit: Payer: Self-pay

## 2021-10-06 VITALS — BP 120/80 | HR 71 | Ht 64.0 in | Wt 155.0 lb

## 2021-10-06 DIAGNOSIS — M5030 Other cervical disc degeneration, unspecified cervical region: Secondary | ICD-10-CM | POA: Diagnosis not present

## 2021-10-06 DIAGNOSIS — M542 Cervicalgia: Secondary | ICD-10-CM | POA: Diagnosis not present

## 2021-10-06 DIAGNOSIS — S46812D Strain of other muscles, fascia and tendons at shoulder and upper arm level, left arm, subsequent encounter: Secondary | ICD-10-CM | POA: Diagnosis not present

## 2021-10-06 NOTE — Patient Instructions (Addendum)
Good to see you  ?Discontinue daily meloxicam use remainder as needed ?Tylenol [phone] mg 2-3 times a day for pain relief  ?You can call back at any time if you  wants [place] to order a C spine MRI and follow up 3 days after that MRI  ?As needed follow up ?

## 2021-10-08 ENCOUNTER — Other Ambulatory Visit: Payer: Self-pay | Admitting: Sports Medicine

## 2021-10-08 ENCOUNTER — Encounter: Payer: Self-pay | Admitting: Sports Medicine

## 2021-10-08 DIAGNOSIS — M5030 Other cervical disc degeneration, unspecified cervical region: Secondary | ICD-10-CM

## 2021-10-08 DIAGNOSIS — M542 Cervicalgia: Secondary | ICD-10-CM

## 2021-10-08 DIAGNOSIS — M9901 Segmental and somatic dysfunction of cervical region: Secondary | ICD-10-CM

## 2021-10-08 NOTE — Telephone Encounter (Signed)
Pt was called and left a message stating that the referral for MRI had been placed. If pt has any questions to call back  ?

## 2021-10-13 ENCOUNTER — Other Ambulatory Visit: Payer: Self-pay

## 2021-10-13 ENCOUNTER — Ambulatory Visit (INDEPENDENT_AMBULATORY_CARE_PROVIDER_SITE_OTHER): Payer: Medicare Other

## 2021-10-13 DIAGNOSIS — M9901 Segmental and somatic dysfunction of cervical region: Secondary | ICD-10-CM | POA: Diagnosis not present

## 2021-10-13 DIAGNOSIS — M542 Cervicalgia: Secondary | ICD-10-CM | POA: Diagnosis not present

## 2021-10-13 DIAGNOSIS — M5030 Other cervical disc degeneration, unspecified cervical region: Secondary | ICD-10-CM

## 2021-10-13 DIAGNOSIS — M2578 Osteophyte, vertebrae: Secondary | ICD-10-CM | POA: Diagnosis not present

## 2021-10-14 ENCOUNTER — Encounter: Payer: Self-pay | Admitting: Sports Medicine

## 2021-10-24 ENCOUNTER — Other Ambulatory Visit: Payer: Self-pay | Admitting: Internal Medicine

## 2021-10-24 DIAGNOSIS — F418 Other specified anxiety disorders: Secondary | ICD-10-CM

## 2021-10-24 DIAGNOSIS — E785 Hyperlipidemia, unspecified: Secondary | ICD-10-CM

## 2021-10-27 ENCOUNTER — Other Ambulatory Visit: Payer: Self-pay | Admitting: Internal Medicine

## 2021-10-27 DIAGNOSIS — F418 Other specified anxiety disorders: Secondary | ICD-10-CM

## 2021-10-28 ENCOUNTER — Encounter: Payer: Self-pay | Admitting: Internal Medicine

## 2021-10-29 ENCOUNTER — Other Ambulatory Visit: Payer: Self-pay | Admitting: Internal Medicine

## 2021-10-29 DIAGNOSIS — F418 Other specified anxiety disorders: Secondary | ICD-10-CM

## 2021-10-29 DIAGNOSIS — E785 Hyperlipidemia, unspecified: Secondary | ICD-10-CM

## 2021-10-29 MED ORDER — ATORVASTATIN CALCIUM 40 MG PO TABS: ORAL_TABLET | ORAL | 0 refills | Status: DC

## 2021-10-29 MED ORDER — ESCITALOPRAM OXALATE 10 MG PO TABS: ORAL_TABLET | ORAL | 0 refills | Status: DC
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
## 2021-11-02 ENCOUNTER — Ambulatory Visit: Payer: Medicare Other | Admitting: Sports Medicine

## 2021-11-02 VITALS — BP 118/82 | HR 72 | Ht 64.0 in | Wt 150.0 lb

## 2021-11-02 DIAGNOSIS — M5030 Other cervical disc degeneration, unspecified cervical region: Secondary | ICD-10-CM | POA: Diagnosis not present

## 2021-11-02 DIAGNOSIS — M4802 Spinal stenosis, cervical region: Secondary | ICD-10-CM | POA: Diagnosis not present

## 2021-11-02 DIAGNOSIS — M542 Cervicalgia: Secondary | ICD-10-CM | POA: Diagnosis not present

## 2021-11-02 NOTE — Patient Instructions (Addendum)
Good to see you  ?Epidural injection Left C5-C6 ordered ?Follow up 2 weeks after injection  ?

## 2021-11-03 ENCOUNTER — Other Ambulatory Visit: Payer: Self-pay | Admitting: Sports Medicine

## 2021-11-03 DIAGNOSIS — M4802 Spinal stenosis, cervical region: Secondary | ICD-10-CM

## 2021-11-03 DIAGNOSIS — M9901 Segmental and somatic dysfunction of cervical region: Secondary | ICD-10-CM

## 2021-11-03 DIAGNOSIS — M5030 Other cervical disc degeneration, unspecified cervical region: Secondary | ICD-10-CM

## 2021-11-03 DIAGNOSIS — M542 Cervicalgia: Secondary | ICD-10-CM

## 2021-11-10 ENCOUNTER — Encounter: Payer: Self-pay | Admitting: Internal Medicine

## 2021-11-17 ENCOUNTER — Encounter: Payer: Self-pay | Admitting: Sports Medicine

## 2021-11-17 DIAGNOSIS — M5412 Radiculopathy, cervical region: Secondary | ICD-10-CM | POA: Diagnosis not present

## 2021-11-17 DIAGNOSIS — M5030 Other cervical disc degeneration, unspecified cervical region: Secondary | ICD-10-CM | POA: Diagnosis not present

## 2021-11-18 NOTE — Telephone Encounter (Signed)
Pt was messaged and notified that we could not send her MRI results over , I did give her the number to medical records  ?

## 2021-12-29 DIAGNOSIS — M5412 Radiculopathy, cervical region: Secondary | ICD-10-CM | POA: Diagnosis not present

## 2022-01-18 ENCOUNTER — Encounter: Payer: Self-pay | Admitting: Internal Medicine

## 2022-01-21 ENCOUNTER — Encounter: Payer: Self-pay | Admitting: Internal Medicine

## 2022-01-22 ENCOUNTER — Other Ambulatory Visit: Payer: Self-pay | Admitting: Internal Medicine

## 2022-01-22 DIAGNOSIS — F418 Other specified anxiety disorders: Secondary | ICD-10-CM

## 2022-01-22 DIAGNOSIS — E785 Hyperlipidemia, unspecified: Secondary | ICD-10-CM

## 2022-01-31 ENCOUNTER — Other Ambulatory Visit: Payer: Self-pay | Admitting: Internal Medicine

## 2022-01-31 DIAGNOSIS — E785 Hyperlipidemia, unspecified: Secondary | ICD-10-CM

## 2022-01-31 DIAGNOSIS — F418 Other specified anxiety disorders: Secondary | ICD-10-CM

## 2022-02-01 MED ORDER — ESCITALOPRAM OXALATE 10 MG PO TABS: 10.0000 mg | ORAL_TABLET | Freq: Every day | ORAL | 0 refills | Status: DC

## 2022-02-01 MED ORDER — ATORVASTATIN CALCIUM 40 MG PO TABS: 40.0000 mg | ORAL_TABLET | Freq: Every day | ORAL | 0 refills | Status: DC

## 2022-02-11 DIAGNOSIS — M5412 Radiculopathy, cervical region: Secondary | ICD-10-CM | POA: Diagnosis not present

## 2022-02-11 DIAGNOSIS — M47812 Spondylosis without myelopathy or radiculopathy, cervical region: Secondary | ICD-10-CM | POA: Diagnosis not present

## 2022-02-11 DIAGNOSIS — M5030 Other cervical disc degeneration, unspecified cervical region: Secondary | ICD-10-CM | POA: Diagnosis not present

## 2022-04-29 ENCOUNTER — Encounter: Payer: Self-pay | Admitting: Physical Therapy

## 2022-04-30 ENCOUNTER — Other Ambulatory Visit: Payer: Self-pay | Admitting: Internal Medicine

## 2022-04-30 DIAGNOSIS — E785 Hyperlipidemia, unspecified: Secondary | ICD-10-CM

## 2022-04-30 DIAGNOSIS — F418 Other specified anxiety disorders: Secondary | ICD-10-CM

## 2022-05-02 ENCOUNTER — Other Ambulatory Visit: Payer: Self-pay | Admitting: Internal Medicine

## 2022-05-02 DIAGNOSIS — E785 Hyperlipidemia, unspecified: Secondary | ICD-10-CM

## 2022-05-02 DIAGNOSIS — F418 Other specified anxiety disorders: Secondary | ICD-10-CM

## 2022-05-04 ENCOUNTER — Encounter: Payer: Self-pay | Admitting: Physical Therapy

## 2022-05-04 ENCOUNTER — Telehealth: Payer: Self-pay | Admitting: Physical Therapy

## 2022-05-04 NOTE — Telephone Encounter (Signed)
Pt was called and PT left VM to tell pt [address] would be happy to see her for her PT needs.   Her last visit was 09/01/21. And she will need a new order to see her at clinic which we will be happy to do for her.   PT explained procedure for getting order and appt through our clinic once she has an order from her MD.   Highest Gaston, PT, [place] [facility] A[facility] Adult  05/04/22 4:33 PM Phone: [phone] Fax: [phone]

## 2022-05-04 NOTE — Progress Notes (Unsigned)
    Subjective:    Patient ID: Ashley Cruz, female    DOB: October 12, 1945, 76 y.o.   MRN: [id number]      Ashley Cruz is here for No chief complaint on file.    Having pain in anterior upper thigh from right to left    Medications and allergies reviewed with patient and updated if appropriate.  Current Outpatient Medications on File Prior to Visit  Medication Sig Dispense Refill   atorvastatin (LIPITOR) 40 MG tablet TAKE 1 TABLET(40 MG) BY MOUTH DAILY AT 6 PM 90 tablet 0   escitalopram (LEXAPRO) 10 MG tablet TAKE 1 TABLET(10 MG) BY MOUTH DAILY 90 tablet 0   fluorouracil (EFUDEX) 5 % cream 1 application     SUMAtriptan (IMITREX) 50 MG tablet Take 1 tablet (50 mg total) by mouth every 2 (two) hours as needed for migraine. 12 tablet 4   No current facility-administered medications on file prior to visit.    Review of Systems     Objective:  There were no vitals filed for this visit. BP Readings from Last 3 Encounters:  11/02/21 118/82  10/06/21 120/80  09/22/21 122/72   Wt Readings from Last 3 Encounters:  11/02/21 150 lb (68 kg)  10/06/21 155 lb (70.3 kg)  09/22/21 153 lb (69.4 kg)   There is no height or weight on file to calculate BMI.    Physical Exam         Assessment & Plan:    See Problem List for Assessment and Plan of chronic medical problems.

## 2022-05-05 ENCOUNTER — Ambulatory Visit (INDEPENDENT_AMBULATORY_CARE_PROVIDER_SITE_OTHER): Payer: Medicare Other | Admitting: Internal Medicine

## 2022-05-05 ENCOUNTER — Ambulatory Visit: Payer: Medicare Other | Admitting: Sports Medicine

## 2022-05-05 ENCOUNTER — Encounter: Payer: Self-pay | Admitting: Internal Medicine

## 2022-05-05 VITALS — BP 132/80 | HR 73 | Ht 64.0 in | Wt 160.0 lb

## 2022-05-05 VITALS — BP 118/80 | HR 64 | Temp 98.1°F | Ht 64.0 in | Wt 160.0 lb

## 2022-05-05 DIAGNOSIS — M7061 Trochanteric bursitis, right hip: Secondary | ICD-10-CM

## 2022-05-05 DIAGNOSIS — M24551 Contracture, right hip: Secondary | ICD-10-CM | POA: Diagnosis not present

## 2022-05-05 DIAGNOSIS — R42 Dizziness and giddiness: Secondary | ICD-10-CM | POA: Diagnosis not present

## 2022-05-05 DIAGNOSIS — M25551 Pain in right hip: Secondary | ICD-10-CM | POA: Diagnosis not present

## 2022-05-05 MED ORDER — IRBESARTAN 75 MG PO TABS: 75.0000 mg | ORAL_TABLET | Freq: Every day | ORAL | 1 refills | Status: DC

## 2022-05-05 NOTE — Patient Instructions (Addendum)
Good to see you  Hip HEP  3-4 week follow up

## 2022-05-05 NOTE — Patient Instructions (Addendum)
    Make an appointment with sports medicine for your right hip pain.     Medications changes include :  start the celebrex       A referral was ordered for sports medicine.     Someone will call you to schedule an appointment.
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
## 2022-05-06 ENCOUNTER — Encounter: Payer: Self-pay | Admitting: Internal Medicine

## 2022-05-11 DIAGNOSIS — L905 Scar conditions and fibrosis of skin: Secondary | ICD-10-CM | POA: Diagnosis not present

## 2022-05-11 DIAGNOSIS — I781 Nevus, non-neoplastic: Secondary | ICD-10-CM | POA: Diagnosis not present

## 2022-05-11 DIAGNOSIS — L820 Inflamed seborrheic keratosis: Secondary | ICD-10-CM | POA: Diagnosis not present

## 2022-05-11 DIAGNOSIS — L570 Actinic keratosis: Secondary | ICD-10-CM | POA: Diagnosis not present

## 2022-05-31 ENCOUNTER — Ambulatory Visit: Payer: Medicare Other | Admitting: Sports Medicine

## 2022-08-23 ENCOUNTER — Encounter: Payer: Self-pay | Admitting: Internal Medicine

## 2022-08-30 ENCOUNTER — Encounter: Payer: Self-pay | Admitting: Internal Medicine

## 2022-08-30 ENCOUNTER — Encounter: Payer: Self-pay | Admitting: Family Medicine

## 2022-08-30 ENCOUNTER — Ambulatory Visit (INDEPENDENT_AMBULATORY_CARE_PROVIDER_SITE_OTHER): Payer: Medicare Other | Admitting: Family Medicine

## 2022-08-30 VITALS — BP 100/64 | HR 75 | Temp 98.0°F | Ht 64.0 in | Wt 167.6 lb

## 2022-08-30 DIAGNOSIS — L299 Pruritus, unspecified: Secondary | ICD-10-CM | POA: Diagnosis not present

## 2022-08-30 DIAGNOSIS — G43409 Hemiplegic migraine, not intractable, without status migrainosus: Secondary | ICD-10-CM | POA: Diagnosis not present

## 2022-08-30 DIAGNOSIS — W57XXXA Bitten or stung by nonvenomous insect and other nonvenomous arthropods, initial encounter: Secondary | ICD-10-CM

## 2022-08-30 DIAGNOSIS — S90464A Insect bite (nonvenomous), right lesser toe(s), initial encounter: Secondary | ICD-10-CM

## 2022-08-30 MED ORDER — CLOBETASOL PROPIONATE 0.05 % EX OINT: 1.0000 | TOPICAL_OINTMENT | Freq: Two times a day (BID) | CUTANEOUS | 0 refills | Status: AC

## 2022-08-30 MED ORDER — SUMATRIPTAN SUCCINATE 50 MG PO TABS: 50.0000 mg | ORAL_TABLET | ORAL | 0 refills | Status: DC | PRN

## 2022-08-30 NOTE — Progress Notes (Signed)
   Established Patient Office Visit  Subjective   Patient ID: Ashley Cruz, female    DOB: [birth year]  Age: 77 y.o. MRN: [id number]  Chief Complaint  Patient presents with   Insect Bite    Patient complains of an insect bite on the right 3rd toe x3 days ago, states she was outside barefoot, noticed something black on her foot    Pt reports she was walking around barefoot in her yard last week and she thought she felt something bite her toe. She thought initially it was a spider or some other insect but she didn't get a good look at it to confirm. Then over the next 3 days she developed a papular/ blistering rash that was extremely itchy. She also reports that her eyelids are also swollen and red. Denies fever/chills, no red streaking or swelling of her extremities.       Review of Systems  All other systems reviewed and are negative.     Objective:     BP 100/64 (BP Location: Left Arm, Patient Position: Sitting, Cuff Size: Normal)   Pulse 75   Temp 98 F (36.7 C) (Oral)   Ht 5' 4"$  (1.626 m)   Wt 167 lb 9.6 oz (76 kg)   SpO2 98%   BMI 28.77 kg/m    Physical Exam Constitutional:      Appearance: Normal appearance. She is normal weight.  Neurological:     Mental Status: She is alert.       The ASCVD Risk score (Arnett DK, et al., 2019) failed to calculate for the following reasons:   The patient has a prior MI or stroke diagnosis    Assessment & Plan:   Problem List Items Addressed This Visit       Unprioritized   Hemiplegic migraine without status migrainosus, not intractable   Relevant Medications   escitalopram (LEXAPRO) 10 MG tablet   SUMAtriptan (IMITREX) 50 MG tablet   Other Visit Diagnoses     Insect bite of lesser toe of right foot, initial encounter    -  Primary   Relevant Orders         Not sure if it is an insect bite or an allergic reaction/ contact dermatitis. It does not appear to be infected or necrotic at this time. Will order CBC and  CMP.  CBC with Differential/Platelets (Completed)   CMP (Completed)   Pruritus of skin       Relevant Medications   Will treat the intense itching with clobetasol ointment applied BID for days clobetasol ointment (TEMOVATE) 0.05 %       No follow-ups on file.    Farrel Conners, MD

## 2022-08-31 LAB — CBC WITH DIFFERENTIAL/PLATELET
Basophils Absolute: 0.1 10*3/uL (ref 0.0–0.1)
Basophils Relative: 1.1 % (ref 0.0–3.0)
Eosinophils Absolute: 0.3 10*3/uL (ref 0.0–0.7)
Eosinophils Relative: 3.6 % (ref 0.0–5.0)
HCT: 44.6 % (ref 36.0–46.0)
Hemoglobin: 14.4 g/dL (ref 12.0–15.0)
Lymphocytes Relative: 44.9 % (ref 12.0–46.0)
Lymphs Abs: 3.7 10*3/uL (ref 0.7–4.0)
MCHC: 32.3 g/dL (ref 30.0–36.0)
MCV: 95.3 fl (ref 78.0–100.0)
Monocytes Absolute: 0.6 10*3/uL (ref 0.1–1.0)
Monocytes Relative: 7.5 % (ref 3.0–12.0)
Neutro Abs: 3.6 10*3/uL (ref 1.4–7.7)
Neutrophils Relative %: 42.9 % — ABNORMAL LOW (ref 43.0–77.0)
Platelets: 304 10*3/uL (ref 150.0–400.0)
RBC: 4.68 Mil/uL (ref 3.87–5.11)
RDW: 14.3 % (ref 11.5–15.5)
WBC: 8.3 10*3/uL (ref 4.0–10.5)

## 2022-08-31 LAB — COMPREHENSIVE METABOLIC PANEL
ALT: 24 U/L (ref 0–35)
AST: 22 U/L (ref 0–37)
Albumin: 4.4 g/dL (ref 3.5–5.2)
Alkaline Phosphatase: 83 U/L (ref 39–117)
BUN: 25 mg/dL — ABNORMAL HIGH (ref 6–23)
CO2: 28 mEq/L (ref 19–32)
Calcium: 9.9 mg/dL (ref 8.4–10.5)
Chloride: 104 mEq/L (ref 96–112)
Creatinine, Ser: 1.27 mg/dL — ABNORMAL HIGH (ref 0.40–1.20)
GFR: 41.39 mL/min — ABNORMAL LOW (ref >60.00)
Glucose, Bld: 102 mg/dL — ABNORMAL HIGH (ref 70–99)
Potassium: 3.9 mEq/L (ref 3.5–5.1)
Sodium: 142 mEq/L (ref 135–145)
Total Bilirubin: 0.6 mg/dL (ref 0.2–1.2)
Total Protein: 7.2 g/dL (ref 6.0–8.3)

## 2022-11-12 ENCOUNTER — Encounter: Payer: Self-pay | Admitting: Internal Medicine

## 2022-11-15 MED ORDER — ESCITALOPRAM OXALATE 10 MG PO TABS: 10.0000 mg | ORAL_TABLET | Freq: Every day | ORAL | 0 refills | Status: DC

## 2022-11-24 DIAGNOSIS — G9389 Other specified disorders of brain: Secondary | ICD-10-CM | POA: Diagnosis not present

## 2022-11-24 DIAGNOSIS — M5030 Other cervical disc degeneration, unspecified cervical region: Secondary | ICD-10-CM | POA: Diagnosis not present

## 2022-11-24 DIAGNOSIS — Z79899 Other long term (current) drug therapy: Secondary | ICD-10-CM | POA: Diagnosis not present

## 2022-11-24 DIAGNOSIS — R930 Abnormal findings on diagnostic imaging of skull and head, not elsewhere classified: Secondary | ICD-10-CM | POA: Diagnosis not present

## 2022-11-24 DIAGNOSIS — R519 Headache, unspecified: Secondary | ICD-10-CM | POA: Diagnosis not present

## 2022-11-24 DIAGNOSIS — R200 Anesthesia of skin: Secondary | ICD-10-CM | POA: Diagnosis not present

## 2022-11-24 LAB — HEPATIC FUNCTION PANEL
ALT: 21 U/L (ref 7–35)
AST: 18 (ref 13–35)
Alkaline Phosphatase: 78 (ref 25–125)
Bilirubin, Total: 0.4

## 2022-11-24 LAB — BASIC METABOLIC PANEL
BUN: 20 (ref 4–21)
CO2: 26 — AB (ref 13–22)
Chloride: 104 (ref 99–108)
Creatinine: 0.9 (ref 0.5–1.1)
Glucose: 105
Potassium: 4.2 mEq/L (ref 3.5–5.1)
Sodium: 139 (ref 137–147)

## 2022-11-24 LAB — COMPREHENSIVE METABOLIC PANEL
Albumin: 4.5 (ref 3.5–5.0)
Calcium: 11.3 — AB (ref 8.7–10.7)
eGFR: 67

## 2022-11-25 DIAGNOSIS — R200 Anesthesia of skin: Secondary | ICD-10-CM | POA: Diagnosis not present

## 2022-11-25 DIAGNOSIS — R079 Chest pain, unspecified: Secondary | ICD-10-CM | POA: Diagnosis not present

## 2022-11-25 DIAGNOSIS — G9389 Other specified disorders of brain: Secondary | ICD-10-CM | POA: Diagnosis not present

## 2022-11-25 DIAGNOSIS — R519 Headache, unspecified: Secondary | ICD-10-CM | POA: Diagnosis not present

## 2022-11-30 ENCOUNTER — Encounter: Payer: Self-pay | Admitting: Internal Medicine

## 2022-11-30 ENCOUNTER — Ambulatory Visit (INDEPENDENT_AMBULATORY_CARE_PROVIDER_SITE_OTHER): Payer: Medicare Other | Admitting: Internal Medicine

## 2022-11-30 VITALS — BP 138/86 | HR 72 | Temp 98.8°F | Resp 16 | Ht 64.0 in | Wt 166.0 lb

## 2022-11-30 DIAGNOSIS — E785 Hyperlipidemia, unspecified: Secondary | ICD-10-CM

## 2022-11-30 DIAGNOSIS — Z0001 Encounter for general adult medical examination with abnormal findings: Secondary | ICD-10-CM | POA: Diagnosis not present

## 2022-11-30 DIAGNOSIS — G379 Demyelinating disease of central nervous system, unspecified: Secondary | ICD-10-CM | POA: Diagnosis not present

## 2022-11-30 DIAGNOSIS — E2839 Other primary ovarian failure: Secondary | ICD-10-CM

## 2022-11-30 DIAGNOSIS — G43409 Hemiplegic migraine, not intractable, without status migrainosus: Secondary | ICD-10-CM | POA: Diagnosis not present

## 2022-11-30 DIAGNOSIS — E559 Vitamin D deficiency, unspecified: Secondary | ICD-10-CM

## 2022-11-30 DIAGNOSIS — F418 Other specified anxiety disorders: Secondary | ICD-10-CM

## 2022-11-30 DIAGNOSIS — Z1231 Encounter for screening mammogram for malignant neoplasm of breast: Secondary | ICD-10-CM | POA: Insufficient documentation

## 2022-11-30 LAB — MAGNESIUM: Magnesium: 2.1 mg/dL (ref 1.5–2.5)

## 2022-11-30 LAB — VITAMIN D 25 HYDROXY (VIT D DEFICIENCY, FRACTURES): VITD: 11.99 ng/mL — ABNORMAL LOW (ref 30.00–100.00)

## 2022-11-30 LAB — LIPID PANEL
Cholesterol: 292 mg/dL — ABNORMAL HIGH (ref 0–200)
HDL: 61.5 mg/dL (ref >39.00)
LDL Cholesterol: 199 mg/dL — ABNORMAL HIGH (ref 0–99)
NonHDL: 230.1
Total CHOL/HDL Ratio: 5
Triglycerides: 156 mg/dL — ABNORMAL HIGH (ref 0.0–149.0)
VLDL: 31.2 mg/dL (ref 0.0–40.0)

## 2022-11-30 LAB — TSH: TSH: 2.33 u[IU]/mL (ref 0.35–5.50)

## 2022-11-30 LAB — PHOSPHORUS: Phosphorus: 2.1 mg/dL — ABNORMAL LOW (ref 2.3–4.6)

## 2022-11-30 MED ORDER — ESCITALOPRAM OXALATE 10 MG PO TABS: 10.0000 mg | ORAL_TABLET | Freq: Every day | ORAL | 0 refills | Status: DC

## 2022-11-30 MED ORDER — ATORVASTATIN CALCIUM 40 MG PO TABS: 40.0000 mg | ORAL_TABLET | Freq: Every day | ORAL | 1 refills | Status: DC

## 2022-11-30 MED ORDER — CHOLECALCIFEROL 1.25 MG (50000 UT) PO CAPS
50000.00 [IU] | ORAL_CAPSULE | ORAL | 0 refills | Status: DC
Start: 2022-11-30 — End: 2023-01-21

## 2022-11-30 MED ORDER — SUMATRIPTAN SUCCINATE 50 MG PO TABS
50.0000 mg | ORAL_TABLET | ORAL | 0 refills | Status: DC | PRN
Start: 2022-11-30 — End: 2023-06-28

## 2022-11-30 NOTE — Progress Notes (Signed)
Subjective:  Patient ID: Ashley Cruz, female    DOB: 07-Oct-1945  Age: 77 y.o. MRN: [id number]  CC: Annual Exam, Hyperlipidemia, Headache, and Depression   HPI Ashley Cruz presents for a CPX and f/up ---  She was recently seen in an outside hospital for a myriad of symptoms.  She tells me she had strokelike symptoms but ruled out for CVA.  Her MRI was however abnormal and she was told to see a neurologist.  She also tells me that she had a cardiac workup that was negative.  Labs were remarkable for mild hypercalcemia.  Outpatient Medications Prior to Visit  Medication Sig Dispense Refill   clobetasol ointment (TEMOVATE) 0.05 % Apply 1 Application topically 2 (two) times daily. 30 g 0   fluorouracil (EFUDEX) 5 % cream 1 application     atorvastatin (LIPITOR) 40 MG tablet TAKE 1 TABLET(40 MG) BY MOUTH DAILY AT 6 PM 90 tablet 0   escitalopram (LEXAPRO) 10 MG tablet Take 1 tablet (10 mg total) by mouth daily. 30 tablet 0   SUMAtriptan (IMITREX) 50 MG tablet Take 1 tablet (50 mg total) by mouth every 2 (two) hours as needed for migraine. 12 tablet 0   No facility-administered medications prior to visit.    ROS Review of Systems  Gastrointestinal:  Positive for constipation. Negative for abdominal pain, blood in stool, diarrhea, nausea and vomiting.  Musculoskeletal: Negative.   Skin: Negative.   Hematological:  Negative for adenopathy. Does not bruise/bleed easily.  Psychiatric/Behavioral:  Positive for confusion, decreased concentration and dysphoric mood. Negative for agitation, behavioral problems, self-injury and suicidal ideas. The patient is nervous/anxious.     Objective:  BP 138/86 (BP Location: Left Arm, Patient Position: Sitting, Cuff Size: Large)   Pulse 72   Temp 98.8 F (37.1 C) (Oral)   Resp 16   Ht 5\' 4"  (1.626 m)   Wt 166 lb (75.3 kg)   SpO2 92%   BMI 28.49 kg/m   BP Readings from Last 3 Encounters:  11/30/22 138/86  08/30/22 100/64  05/05/22 132/80     Wt Readings from Last 3 Encounters:  11/30/22 166 lb (75.3 kg)  08/30/22 167 lb 9.6 oz (76 kg)  05/05/22 160 lb (72.6 kg)    Physical Exam Vitals reviewed.  Constitutional:      Appearance: She is well-developed.  HENT:     Nose: Nose normal.     Mouth/Throat:     Mouth: Mucous membranes are moist.  Eyes:     General: No scleral icterus.    Conjunctiva/sclera: Conjunctivae normal.  Cardiovascular:     Rate and Rhythm: Normal rate and regular rhythm.     Heart sounds: No murmur heard. Pulmonary:     Effort: Pulmonary effort is normal.     Breath sounds: No stridor. No wheezing, rhonchi or rales.  Abdominal:     General: Abdomen is flat.     Palpations: There is no mass.     Tenderness: There is no abdominal tenderness. There is no guarding.     Hernia: No hernia is present.  Musculoskeletal:        General: Normal range of motion.     Cervical back: Neck supple.     Right lower leg: No edema.     Left lower leg: No edema.  Lymphadenopathy:     Cervical: No cervical adenopathy.  Skin:    General: Skin is warm and dry.  Neurological:     General: No  focal deficit present.     Mental Status: She is alert. Mental status is at baseline.  Psychiatric:        Attention and Perception: She is inattentive.        Mood and Affect: Mood is anxious. Mood is not depressed. Affect is not flat.        Speech: Speech is delayed. Speech is not tangential.        Behavior: Behavior normal. Behavior is cooperative.        Thought Content: Thought content normal. Thought content is not paranoid or delusional. Thought content does not include homicidal or suicidal ideation.        Cognition and Memory: Cognition is impaired. Memory is impaired.        Judgment: Judgment normal.     Lab Results  Component Value Date   WBC 8.3 08/30/2022   HGB 14.4 08/30/2022   HCT 44.6 08/30/2022   PLT 304.0 08/30/2022   GLUCOSE 102 (H) 08/30/2022   CHOL 292 (H) 11/30/2022   TRIG 156.0 (H)  11/30/2022   HDL 61.50 11/30/2022   LDLDIRECT 232.0 04/14/2016   LDLCALC 199 (H) 11/30/2022   ALT 21 11/24/2022   AST 18 11/24/2022   NA 139 11/24/2022   K 4.2 11/24/2022   CL 104 11/24/2022   CREATININE 0.9 11/24/2022   BUN 20 11/24/2022   CO2 26 (A) 11/24/2022   TSH 2.33 11/30/2022   INR 1.0 RATIO 04/04/2008   HGBA1C  11/26/2008    5.7 (NOTE) The ADA recommends the following therapeutic goal for glycemic control related to Hgb A1c measurement: Goal of therapy: <6.5 Hgb A1c  Reference: American Diabetes Association: Clinical Practice Recommendations 2010, Diabetes Care, 2010, 33: (Suppl  1).    MM 3D SCREEN BREAST BILATERAL  Result Date: 05/08/2021 CLINICAL DATA:  Screening. EXAM: DIGITAL SCREENING BILATERAL MAMMOGRAM WITH TOMOSYNTHESIS AND CAD TECHNIQUE: Bilateral screening digital craniocaudal and mediolateral oblique mammograms were obtained. Bilateral screening digital breast tomosynthesis was performed. The images were evaluated with computer-aided detection. COMPARISON:  Previous exam(s). ACR Breast Density Category b: There are scattered areas of fibroglandular density. FINDINGS: There are no findings suspicious for malignancy. IMPRESSION: No mammographic evidence of malignancy. A result letter of this screening mammogram will be mailed directly to the patient. RECOMMENDATION: Screening mammogram in one year. (Code:SM-B-01Y) BI-RADS CATEGORY  1: Negative. Electronically Signed   By: Sherian Rein M.D.   On: 05/08/2021 10:08   Sinuses/Orbits:No paranasal sinus fluid levels or advanced mucosal thickening. No mastoid or middle ear effusion. Normal orbits.  IMPRESSION: 1. No acute intracranial abnormality. 2. Numerous chronic white matter lesions scattered throughout both hemispheres, progressed compared to 04/06/2010. The pattern is suggestive of demyelinating disease.   Electronically Signed   By: Deatra Robinson M.D.   On: 11/25/2022 03:45 Exam End: 11/25/22 03:38    Impression  1. No acute intracranial process. 2. Extensive chronic microvascular ischemic changes. 3. Oval density in the nasal passage on the right, possible mucosal retention cyst or polyp. Direct visualization is recommended for further evaluation. 4. Multilevel degenerative changes in the cervical spine without evidence of acute fracture. 5. Right upper lobe pulmonary nodules measuring up to 6 mm. Non-contrast chest CT at 6-12 months is recommended. If the nodule is stable at time of repeat CT, then future CT at 18-24 months (from today's scan) is considered optional for low-risk patients, but is recommended for high-risk patients. This recommendation follows the consensus statement: Guidelines for Management of Incidental Pulmonary  Nodules Detected on CT Images: From the Fleischner Society 2017; Radiology 2017; [phone].   Electronically Signed   By: Thornell Sartorius M.D.   On: 11/24/2022 23:02   Assessment & Plan:   Estrogen deficiency -     DG Bone Density; Future  Hemiplegic migraine without status migrainosus, not intractable -     SUMAtriptan Succinate; Take 1 tablet (50 mg total) by mouth every 2 (two) hours as needed for migraine.  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 Doing well on the statin  -     TSH; Future -     Lipid panel; Future -     Atorvastatin Calcium; Take 1 tablet (40 mg total) by mouth daily.  Dispense: 90 tablet; Refill: 1  Encounter for general adult medical examination with abnormal findings- Exam completed, labs reviewed, vaccines are up-to-date, cancer screenings addressed, patient education was given.  Hypercalcemia- Her calcium is normal now.  Will treat the vitamin D deficiency. -     PTH, intact and  calcium -     Phosphorus; Future -     Magnesium; Future -     TSH; Future -     VITAMIN D 25 Hydroxy (Vit-D Deficiency, Fractures); Future  Anxiety with depression -     Escitalopram Oxalate; Take 1 tablet (10 mg total) by mouth daily.  Dispense: 90 tablet; Refill: 0  Vitamin D deficiency disease -     Cholecalciferol; Take 1 capsule (50,000 Units total) by mouth once a week.  Dispense: 12 capsule; Refill: 0     Follow-up: Return in about 3 months (around 03/02/2023).  Sanda Linger, MD

## 2022-11-30 NOTE — Patient Instructions (Signed)

## 2022-12-02 LAB — PTH, INTACT AND CALCIUM
Calcium: 9.3 mg/dL (ref 8.6–10.4)
PTH: 64 pg/mL (ref 16–77)

## 2022-12-16 ENCOUNTER — Other Ambulatory Visit: Payer: Self-pay | Admitting: Internal Medicine

## 2022-12-16 DIAGNOSIS — F418 Other specified anxiety disorders: Secondary | ICD-10-CM

## 2022-12-17 MED ORDER — ESCITALOPRAM OXALATE 10 MG PO TABS: 10.0000 mg | ORAL_TABLET | Freq: Every day | ORAL | 0 refills | Status: DC

## 2022-12-22 ENCOUNTER — Encounter: Payer: Self-pay | Admitting: Internal Medicine

## 2022-12-27 ENCOUNTER — Encounter: Payer: Self-pay | Admitting: Internal Medicine

## 2022-12-30 ENCOUNTER — Other Ambulatory Visit: Payer: Self-pay | Admitting: Internal Medicine

## 2022-12-30 ENCOUNTER — Encounter: Payer: Self-pay | Admitting: Internal Medicine

## 2022-12-30 DIAGNOSIS — E2839 Other primary ovarian failure: Secondary | ICD-10-CM

## 2022-12-30 DIAGNOSIS — Z1231 Encounter for screening mammogram for malignant neoplasm of breast: Secondary | ICD-10-CM

## 2022-12-31 ENCOUNTER — Ambulatory Visit
Admission: RE | Admit: 2022-12-31 | Discharge: 2022-12-31 | Disposition: A | Discharge status: Home / Self Care | Payer: Medicare Other | Source: Ambulatory Visit | Attending: Internal Medicine | Admitting: Internal Medicine

## 2022-12-31 ENCOUNTER — Other Ambulatory Visit: Payer: Self-pay | Admitting: Internal Medicine

## 2022-12-31 DIAGNOSIS — E2839 Other primary ovarian failure: Secondary | ICD-10-CM

## 2022-12-31 DIAGNOSIS — F418 Other specified anxiety disorders: Secondary | ICD-10-CM

## 2022-12-31 DIAGNOSIS — E785 Hyperlipidemia, unspecified: Secondary | ICD-10-CM

## 2022-12-31 DIAGNOSIS — Z1231 Encounter for screening mammogram for malignant neoplasm of breast: Secondary | ICD-10-CM

## 2022-12-31 DIAGNOSIS — G43409 Hemiplegic migraine, not intractable, without status migrainosus: Secondary | ICD-10-CM

## 2022-12-31 DIAGNOSIS — Z0001 Encounter for general adult medical examination with abnormal findings: Secondary | ICD-10-CM

## 2022-12-31 DIAGNOSIS — E559 Vitamin D deficiency, unspecified: Secondary | ICD-10-CM

## 2022-12-31 DIAGNOSIS — G379 Demyelinating disease of central nervous system, unspecified: Secondary | ICD-10-CM

## 2023-01-17 DIAGNOSIS — R531 Weakness: Secondary | ICD-10-CM | POA: Diagnosis not present

## 2023-01-21 ENCOUNTER — Other Ambulatory Visit: Payer: Self-pay | Admitting: Internal Medicine

## 2023-01-21 DIAGNOSIS — E559 Vitamin D deficiency, unspecified: Secondary | ICD-10-CM

## 2023-01-21 MED ORDER — CHOLECALCIFEROL 1.25 MG (50000 UT) PO CAPS
50000.0000 [IU] | ORAL_CAPSULE | ORAL | 0 refills | Status: DC
Start: 2023-01-21 — End: 2023-04-21

## 2023-01-26 ENCOUNTER — Ambulatory Visit (HOSPITAL_BASED_OUTPATIENT_CLINIC_OR_DEPARTMENT_OTHER)
Admission: RE | Admit: 2023-01-26 | Discharge: 2023-01-26 | Disposition: A | Discharge status: Home / Self Care | Payer: Medicare Other | Source: Ambulatory Visit | Attending: Internal Medicine | Admitting: Internal Medicine

## 2023-01-26 ENCOUNTER — Encounter: Payer: Self-pay | Admitting: Internal Medicine

## 2023-01-26 DIAGNOSIS — E2839 Other primary ovarian failure: Secondary | ICD-10-CM | POA: Diagnosis present

## 2023-01-26 DIAGNOSIS — Z780 Asymptomatic menopausal state: Secondary | ICD-10-CM | POA: Diagnosis not present

## 2023-01-26 DIAGNOSIS — M85851 Other specified disorders of bone density and structure, right thigh: Secondary | ICD-10-CM | POA: Diagnosis not present

## 2023-03-03 DIAGNOSIS — D485 Neoplasm of uncertain behavior of skin: Secondary | ICD-10-CM | POA: Diagnosis not present

## 2023-03-03 DIAGNOSIS — D225 Melanocytic nevi of trunk: Secondary | ICD-10-CM | POA: Diagnosis not present

## 2023-03-12 ENCOUNTER — Encounter: Payer: Self-pay | Admitting: Internal Medicine

## 2023-03-14 ENCOUNTER — Other Ambulatory Visit: Payer: Self-pay

## 2023-03-14 DIAGNOSIS — Z1211 Encounter for screening for malignant neoplasm of colon: Secondary | ICD-10-CM

## 2023-03-24 LAB — COLOGUARD

## 2023-04-02 DIAGNOSIS — Z1211 Encounter for screening for malignant neoplasm of colon: Secondary | ICD-10-CM | POA: Diagnosis not present

## 2023-04-10 LAB — COLOGUARD: COLOGUARD: NEGATIVE

## 2023-04-21 ENCOUNTER — Other Ambulatory Visit: Payer: Self-pay | Admitting: Internal Medicine

## 2023-04-21 DIAGNOSIS — E559 Vitamin D deficiency, unspecified: Secondary | ICD-10-CM

## 2023-04-22 ENCOUNTER — Encounter: Payer: Self-pay | Admitting: Internal Medicine

## 2023-04-23 MED ORDER — CHOLECALCIFEROL 1.25 MG (50000 UT) PO CAPS: 50000.0000 [IU] | ORAL_CAPSULE | ORAL | 0 refills | Status: DC

## 2023-05-16 ENCOUNTER — Other Ambulatory Visit: Payer: Self-pay | Admitting: Internal Medicine

## 2023-05-16 DIAGNOSIS — R911 Solitary pulmonary nodule: Secondary | ICD-10-CM

## 2023-05-26 ENCOUNTER — Encounter: Payer: Self-pay | Admitting: Internal Medicine

## 2023-05-27 ENCOUNTER — Telehealth (HOSPITAL_BASED_OUTPATIENT_CLINIC_OR_DEPARTMENT_OTHER): Payer: Self-pay

## 2023-05-28 ENCOUNTER — Other Ambulatory Visit: Payer: Self-pay | Admitting: Internal Medicine

## 2023-05-28 DIAGNOSIS — E785 Hyperlipidemia, unspecified: Secondary | ICD-10-CM

## 2023-06-22 ENCOUNTER — Other Ambulatory Visit: Payer: Self-pay

## 2023-06-22 ENCOUNTER — Telehealth: Payer: Self-pay | Admitting: Internal Medicine

## 2023-06-22 DIAGNOSIS — F418 Other specified anxiety disorders: Secondary | ICD-10-CM

## 2023-06-22 DIAGNOSIS — E559 Vitamin D deficiency, unspecified: Secondary | ICD-10-CM

## 2023-06-22 NOTE — Telephone Encounter (Signed)
Prescription Request  06/22/2023  LOV: 11/30/2022  What is the name of the medication or equipment? escitalopram (LEXAPRO) 10 MG tablet  Cholecalciferol 1.25 MG (50000 UT) capsule    Have you contacted your pharmacy to request a refill? No   Which pharmacy would you like this sent to?  [id number] - [place], [place] - [address] AT [facility] OF [address] [place] [address] Phone: [phone] Fax: [phone]    Patient notified that their request is being sent to the clinical staff for review and that they should receive a response within 2 business days.   Please advise at Mobile [phone] (mobile)

## 2023-06-22 NOTE — Telephone Encounter (Signed)
Medication refill sent to Dr. Yetta Barre

## 2023-06-23 ENCOUNTER — Telehealth: Payer: Self-pay | Admitting: Internal Medicine

## 2023-06-23 ENCOUNTER — Other Ambulatory Visit: Payer: Self-pay | Admitting: Internal Medicine

## 2023-06-23 ENCOUNTER — Other Ambulatory Visit: Payer: Self-pay

## 2023-06-23 DIAGNOSIS — E559 Vitamin D deficiency, unspecified: Secondary | ICD-10-CM

## 2023-06-23 DIAGNOSIS — F418 Other specified anxiety disorders: Secondary | ICD-10-CM

## 2023-06-23 DIAGNOSIS — E785 Hyperlipidemia, unspecified: Secondary | ICD-10-CM

## 2023-06-23 NOTE — Telephone Encounter (Signed)
 Medication refill sent to Dr. Yetta Barre

## 2023-06-23 NOTE — Telephone Encounter (Signed)
Prescription Request  06/23/2023  LOV: 11/30/2022  What is the name of the medication or equipment? atorvastatin (LIPITOR) 40 MG tablet   Have you contacted your pharmacy to request a refill? No   Which pharmacy would you like this sent to?  [id number] - [place], [place] - [address] AT [facility] OF [address] [place] [address] Phone: [phone] Fax: [phone]    Patient notified that their request is being sent to the clinical staff for review and that they should receive a response within 2 business days.   Please advise at Mobile [phone] (mobile)

## 2023-06-24 ENCOUNTER — Other Ambulatory Visit: Payer: Self-pay | Admitting: Internal Medicine

## 2023-06-24 DIAGNOSIS — F418 Other specified anxiety disorders: Secondary | ICD-10-CM

## 2023-06-24 DIAGNOSIS — E559 Vitamin D deficiency, unspecified: Secondary | ICD-10-CM

## 2023-06-24 DIAGNOSIS — E785 Hyperlipidemia, unspecified: Secondary | ICD-10-CM

## 2023-06-24 MED ORDER — ESCITALOPRAM OXALATE 10 MG PO TABS
10.0000 mg | ORAL_TABLET | Freq: Every day | ORAL | 0 refills | Status: DC
Start: 2023-06-24 — End: 2023-06-28

## 2023-06-24 MED ORDER — CHOLECALCIFEROL 1.25 MG (50000 UT) PO CAPS: 50000.0000 [IU] | ORAL_CAPSULE | ORAL | 0 refills | Status: DC

## 2023-06-25 ENCOUNTER — Encounter: Payer: Self-pay | Admitting: Internal Medicine

## 2023-06-27 ENCOUNTER — Encounter: Payer: Self-pay | Admitting: Internal Medicine

## 2023-06-27 DIAGNOSIS — R7303 Prediabetes: Secondary | ICD-10-CM | POA: Insufficient documentation

## 2023-06-27 DIAGNOSIS — R739 Hyperglycemia, unspecified: Secondary | ICD-10-CM | POA: Insufficient documentation

## 2023-06-27 MED ORDER — ATORVASTATIN CALCIUM 40 MG PO TABS: 40.0000 mg | ORAL_TABLET | Freq: Every day | ORAL | 1 refills | Status: DC

## 2023-06-27 NOTE — Progress Notes (Unsigned)
Subjective:    Patient ID: Ashley Cruz, female    DOB: [birth year], 77 y.o.   MRN: [id number]     HPI [name] is here for follow up of her chronic medical problems.  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  The stress has caused her to eat more than she should and has gained weight.  She is discouraged by this, but she is not able to concentrate on her weight at this time  Doing well with her medications.  Medications and allergies reviewed with patient and updated if appropriate.  Current Outpatient Medications on File Prior to Visit  Medication Sig Dispense Refill   Cholecalciferol 1.25 MG (50000 UT) capsule Take 1 capsule (50,000 Units total) by mouth once a week. 4 capsule 0   clobetasol ointment (TEMOVATE) 0.05 % Apply 1 Application topically 2 (two) times daily. 30 g 0   escitalopram (LEXAPRO) 10 MG tablet Take 1 tablet (10 mg total) by mouth daily. 30 tablet 0   fluorouracil (EFUDEX) 5 % cream 1 application     SUMAtriptan (IMITREX) 50 MG tablet Take 1 tablet (50 mg total) by mouth every 2 (two) hours as needed for migraine. 12 tablet 0   No current facility-administered medications on file prior to visit.     Review of Systems  Constitutional:  Negative for fever.  HENT:  Positive for postnasal drip.   Respiratory:  Positive for cough and shortness of breath (? weight related). Negative for wheezing.   Cardiovascular:  Negative for chest pain, palpitations and leg swelling.  Neurological:  Negative for light-headedness and headaches.       Objective:   Vitals:   06/28/23 1337  BP: 128/80  Pulse: 80  Temp: 98 F (36.7 C)  SpO2: 97%   BP Readings from Last 3 Encounters:  06/28/23 128/80  11/30/22 138/86  08/30/22 100/64   Wt Readings from Last 3 Encounters:  06/28/23 170 lb (77.1 kg)  11/30/22 166 lb (75.3 kg)  08/30/22 167 lb 9.6 oz (76 kg)   Body mass index is 29.18 kg/m.    Physical  Exam Constitutional:      General: She is not in acute distress.    Appearance: Normal appearance.  HENT:     Head: Normocephalic and atraumatic.  Eyes:     Conjunctiva/sclera: Conjunctivae normal.  Cardiovascular:     Rate and Rhythm: Normal rate and regular rhythm.     Heart sounds: Normal heart sounds.  Pulmonary:     Effort: Pulmonary effort is normal. No respiratory distress.     Breath sounds: Normal breath sounds. No wheezing.  Musculoskeletal:     Cervical back: Neck supple.     Right lower leg: No edema.     Left lower leg: No edema.  Lymphadenopathy:     Cervical: No cervical adenopathy.  Skin:    General: Skin is warm and dry.     Findings: No rash.  Neurological:     Mental Status: She is alert. Mental status is at baseline.  Psychiatric:        Mood and Affect: Mood normal.        Behavior: Behavior normal.        Lab Results  Component Value Date   WBC 8.3 08/30/2022   HGB 14.4 08/30/2022   HCT 44.6 08/30/2022   PLT 304.0 08/30/2022   GLUCOSE 102 (H) 08/30/2022   CHOL 292 (  H) 11/30/2022   TRIG 156.0 (H) 11/30/2022   HDL 61.50 11/30/2022   LDLDIRECT 232.0 04/14/2016   LDLCALC 199 (H) 11/30/2022   ALT 21 11/24/2022   AST 18 11/24/2022   NA 139 11/24/2022   K 4.2 11/24/2022   CL 104 11/24/2022   CREATININE 0.9 11/24/2022   BUN 20 11/24/2022   CO2 26 (A) 11/24/2022   TSH 2.33 11/30/2022   INR 1.0 RATIO 04/04/2008   HGBA1C  11/26/2008    5.7 (NOTE) The ADA recommends the following therapeutic goal for glycemic control related to Hgb A1c measurement: Goal of therapy: <6.5 Hgb A1c  Reference: American Diabetes Association: Clinical Practice Recommendations 2010, Diabetes Care, 2010, 33: (Suppl  1).     Assessment & Plan:    See Problem List for Assessment and Plan of chronic medical problems.

## 2023-06-27 NOTE — Patient Instructions (Addendum)
      Blood work was ordered.       Medications changes include :   None    A referral was ordered and someone will call you to schedule an appointment.     Return in about 6 months (around 12/27/2023) for Physical Exam.

## 2023-06-28 ENCOUNTER — Ambulatory Visit (INDEPENDENT_AMBULATORY_CARE_PROVIDER_SITE_OTHER): Payer: Medicare Other | Admitting: Internal Medicine

## 2023-06-28 VITALS — BP 128/80 | HR 80 | Temp 98.0°F | Ht 64.0 in | Wt 170.0 lb

## 2023-06-28 DIAGNOSIS — F418 Other specified anxiety disorders: Secondary | ICD-10-CM | POA: Diagnosis not present

## 2023-06-28 DIAGNOSIS — E785 Hyperlipidemia, unspecified: Secondary | ICD-10-CM

## 2023-06-28 DIAGNOSIS — E6609 Other obesity due to excess calories: Secondary | ICD-10-CM

## 2023-06-28 DIAGNOSIS — E559 Vitamin D deficiency, unspecified: Secondary | ICD-10-CM | POA: Diagnosis not present

## 2023-06-28 DIAGNOSIS — E66811 Obesity, class 1: Secondary | ICD-10-CM

## 2023-06-28 DIAGNOSIS — Z6830 Body mass index (BMI) 30.0-30.9, adult: Secondary | ICD-10-CM

## 2023-06-28 DIAGNOSIS — G43409 Hemiplegic migraine, not intractable, without status migrainosus: Secondary | ICD-10-CM | POA: Diagnosis not present

## 2023-06-28 DIAGNOSIS — R739 Hyperglycemia, unspecified: Secondary | ICD-10-CM

## 2023-06-28 MED ORDER — SUMATRIPTAN SUCCINATE 50 MG PO TABS: 50.0000 mg | ORAL_TABLET | ORAL | 5 refills | Status: AC | PRN

## 2023-06-28 MED ORDER — ESCITALOPRAM OXALATE 10 MG PO TABS: 10.0000 mg | ORAL_TABLET | Freq: Every day | ORAL | 1 refills | Status: DC

## 2023-06-28 NOTE — Assessment & Plan Note (Signed)
Chronic Check a1c Low sugar / carb diet Stressed regular exercise  

## 2023-06-28 NOTE — Assessment & Plan Note (Signed)
Chronic Occasional migraines Controlled with current medication as needed Continue Imitrex 50 mg as needed for migraine

## 2023-06-28 NOTE — Assessment & Plan Note (Signed)
Chronic Has gained weight recently secondary to increased stress with her husband being very ill Was on Ozempic in the past and would like to consider this at some point in the near future which I think is very reasonable-I think this will help her prevent and reduce risk of coronary artery disease

## 2023-06-28 NOTE — Assessment & Plan Note (Signed)
Chronic °Regular exercise and healthy diet encouraged °Check lipid panel, cmp °Continue atorvastatin 40 mg daily °

## 2023-06-28 NOTE — Assessment & Plan Note (Signed)
Chronic Controlled, Stable Continue Lexapro 10 mg daily 

## 2023-06-28 NOTE — Assessment & Plan Note (Signed)
Chronic Taking vitamin d daily Check vitamin d level  

## 2023-07-17 ENCOUNTER — Other Ambulatory Visit: Payer: Self-pay | Admitting: Internal Medicine

## 2023-07-17 ENCOUNTER — Encounter: Payer: Self-pay | Admitting: Internal Medicine

## 2023-07-17 DIAGNOSIS — E559 Vitamin D deficiency, unspecified: Secondary | ICD-10-CM

## 2023-07-21 ENCOUNTER — Other Ambulatory Visit (INDEPENDENT_AMBULATORY_CARE_PROVIDER_SITE_OTHER): Payer: Medicare Other

## 2023-07-21 DIAGNOSIS — E785 Hyperlipidemia, unspecified: Secondary | ICD-10-CM

## 2023-07-21 DIAGNOSIS — E559 Vitamin D deficiency, unspecified: Secondary | ICD-10-CM

## 2023-07-21 DIAGNOSIS — R739 Hyperglycemia, unspecified: Secondary | ICD-10-CM | POA: Diagnosis not present

## 2023-07-21 LAB — COMPREHENSIVE METABOLIC PANEL
ALT: 21 U/L (ref 0–35)
AST: 19 U/L (ref 0–37)
Albumin: 4.3 g/dL (ref 3.5–5.2)
Alkaline Phosphatase: 98 U/L (ref 39–117)
BUN: 16 mg/dL (ref 6–23)
CO2: 29 meq/L (ref 19–32)
Calcium: 9.8 mg/dL (ref 8.4–10.5)
Chloride: 104 meq/L (ref 96–112)
Creatinine, Ser: 0.91 mg/dL (ref 0.40–1.20)
GFR: 61.36 mL/min (ref >60.00)
Glucose, Bld: 91 mg/dL (ref 70–99)
Potassium: 4 meq/L (ref 3.5–5.1)
Sodium: 142 meq/L (ref 135–145)
Total Bilirubin: 0.6 mg/dL (ref 0.2–1.2)
Total Protein: 7.5 g/dL (ref 6.0–8.3)

## 2023-07-21 LAB — LIPID PANEL
Cholesterol: 207 mg/dL — ABNORMAL HIGH (ref 0–200)
HDL: 64.2 mg/dL (ref >39.00)
LDL Cholesterol: 117 mg/dL — ABNORMAL HIGH (ref 0–99)
NonHDL: 142.66
Total CHOL/HDL Ratio: 3
Triglycerides: 129 mg/dL (ref 0.0–149.0)
VLDL: 25.8 mg/dL (ref 0.0–40.0)

## 2023-07-21 LAB — VITAMIN D 25 HYDROXY (VIT D DEFICIENCY, FRACTURES): VITD: 87.29 ng/mL (ref 30.00–100.00)

## 2023-07-21 LAB — HEMOGLOBIN A1C: Hgb A1c MFr Bld: 6.3 % (ref 4.6–6.5)

## 2023-07-24 ENCOUNTER — Encounter: Payer: Self-pay | Admitting: Internal Medicine

## 2023-07-25 ENCOUNTER — Encounter: Payer: Self-pay | Admitting: Internal Medicine

## 2023-08-03 ENCOUNTER — Encounter: Payer: Self-pay | Admitting: Internal Medicine

## 2023-08-10 ENCOUNTER — Ambulatory Visit (HOSPITAL_BASED_OUTPATIENT_CLINIC_OR_DEPARTMENT_OTHER)
Admission: RE | Admit: 2023-08-10 | Discharge: 2023-08-10 | Disposition: A | Discharge status: Home / Self Care | Payer: Medicare Other | Source: Ambulatory Visit | Attending: Internal Medicine | Admitting: Internal Medicine

## 2023-08-10 DIAGNOSIS — R911 Solitary pulmonary nodule: Secondary | ICD-10-CM | POA: Insufficient documentation

## 2023-08-10 DIAGNOSIS — R918 Other nonspecific abnormal finding of lung field: Secondary | ICD-10-CM | POA: Diagnosis not present

## 2023-08-15 ENCOUNTER — Encounter: Payer: Self-pay | Admitting: Internal Medicine

## 2023-08-21 ENCOUNTER — Encounter: Payer: Self-pay | Admitting: Internal Medicine

## 2023-08-22 ENCOUNTER — Encounter: Payer: Self-pay | Admitting: Internal Medicine

## 2023-09-13 ENCOUNTER — Encounter: Payer: Self-pay | Admitting: Internal Medicine

## 2023-09-14 ENCOUNTER — Encounter: Payer: Self-pay | Admitting: Internal Medicine

## 2023-09-18 MED ORDER — METFORMIN HCL ER 500 MG PO TB24: 500.0000 mg | ORAL_TABLET | Freq: Two times a day (BID) | ORAL | 5 refills | Status: DC

## 2023-11-24 ENCOUNTER — Other Ambulatory Visit: Payer: Self-pay | Admitting: Internal Medicine

## 2023-11-24 DIAGNOSIS — E785 Hyperlipidemia, unspecified: Secondary | ICD-10-CM

## 2023-11-27 ENCOUNTER — Other Ambulatory Visit: Payer: Self-pay | Admitting: Internal Medicine

## 2023-11-27 DIAGNOSIS — F418 Other specified anxiety disorders: Secondary | ICD-10-CM

## 2024-01-16 ENCOUNTER — Encounter: Payer: Self-pay | Admitting: Internal Medicine

## 2024-01-16 ENCOUNTER — Other Ambulatory Visit: Payer: Self-pay

## 2024-01-16 DIAGNOSIS — E785 Hyperlipidemia, unspecified: Secondary | ICD-10-CM

## 2024-01-16 MED ORDER — ATORVASTATIN CALCIUM 40 MG PO TABS
40.0000 mg | ORAL_TABLET | Freq: Every day | ORAL | 0 refills | Status: DC
Start: 2024-01-16 — End: 2024-03-05

## 2024-02-18 ENCOUNTER — Encounter: Payer: Self-pay | Admitting: Internal Medicine

## 2024-02-18 DIAGNOSIS — E559 Vitamin D deficiency, unspecified: Secondary | ICD-10-CM

## 2024-02-18 DIAGNOSIS — F418 Other specified anxiety disorders: Secondary | ICD-10-CM

## 2024-02-18 DIAGNOSIS — R7303 Prediabetes: Secondary | ICD-10-CM

## 2024-02-18 DIAGNOSIS — M810 Age-related osteoporosis without current pathological fracture: Secondary | ICD-10-CM

## 2024-02-18 DIAGNOSIS — E785 Hyperlipidemia, unspecified: Secondary | ICD-10-CM

## 2024-02-20 ENCOUNTER — Other Ambulatory Visit: Payer: Self-pay | Admitting: Internal Medicine

## 2024-02-20 DIAGNOSIS — Z1231 Encounter for screening mammogram for malignant neoplasm of breast: Secondary | ICD-10-CM

## 2024-02-20 NOTE — Telephone Encounter (Signed)
 Do you want pt to get labs before physical on August 18th or just wait for her visit?  Also wants to know if you know anything about [facility] and their [facility] and program?

## 2024-03-04 ENCOUNTER — Encounter: Payer: Self-pay | Admitting: Internal Medicine

## 2024-03-04 NOTE — Patient Instructions (Addendum)
 Blood work was ordered.       Medications changes include :   None    A referral was ordered for cardiology and someone will call you to schedule an appointment.     Return in about 6 months (around 09/05/2024) for follow up.    Health Maintenance, Female Adopting a healthy lifestyle and getting preventive care are important in promoting health and wellness. Ask your health care provider about: The right schedule for you to have regular tests and exams. Things you can do on your own to prevent diseases and keep yourself healthy. What should I know about diet, weight, and exercise? Eat a healthy diet  Eat a diet that includes plenty of vegetables, fruits, low-fat dairy products, and lean protein. Do not eat a lot of foods that are high in solid fats, added sugars, or sodium. Maintain a healthy weight Body mass index (BMI) is used to identify weight problems. It estimates body fat based on height and weight. Your health care provider can help determine your BMI and help you achieve or maintain a healthy weight. Get regular exercise Get regular exercise. This is one of the most important things you can do for your health. Most adults should: Exercise for at least 150 minutes each week. The exercise should increase your heart rate and make you sweat (moderate-intensity exercise). Do strengthening exercises at least twice a week. This is in addition to the moderate-intensity exercise. Spend less time sitting. Even light physical activity can be beneficial. Watch cholesterol and blood lipids Have your blood tested for lipids and cholesterol at 77 years of age, then have this test every 5 years. Have your cholesterol levels checked more often if: Your lipid or cholesterol levels are high. You are older than 77 years of age. You are at high risk for heart disease. What should I know about cancer screening? Depending on your health history and family history, you may need to  have cancer screening at various ages. This may include screening for: Breast cancer. Cervical cancer. Colorectal cancer. Skin cancer. Lung cancer. What should I know about heart disease, diabetes, and high blood pressure? Blood pressure and heart disease High blood pressure causes heart disease and increases the risk of stroke. This is more likely to develop in people who have high blood pressure readings or are overweight. Have your blood pressure checked: Every 3-5 years if you are 49-4 years of age. Every year if you are 28 years old or older. Diabetes Have regular diabetes screenings. This checks your fasting blood sugar level. Have the screening done: Once every three years after age 68 if you are at a normal weight and have a low risk for diabetes. More often and at a younger age if you are overweight or have a high risk for diabetes. What should I know about preventing infection? Hepatitis B If you have a higher risk for hepatitis B, you should be screened for this virus. Talk with your health care provider to find out if you are at risk for hepatitis B infection. Hepatitis C Testing is recommended for: Everyone born from [birth year] through 1965. Anyone with known risk factors for hepatitis C. Sexually transmitted infections (STIs) Get screened for STIs, including gonorrhea and chlamydia, if: You are sexually active and are younger than 77 years of age. You are older than 77 years of age and your health care provider tells you that you are at risk for this type of infection. Your  sexual activity has changed since you were last screened, and you are at increased risk for chlamydia or gonorrhea. Ask your health care provider if you are at risk. Ask your health care provider about whether you are at high risk for HIV. Your health care provider may recommend a prescription medicine to help prevent HIV infection. If you choose to take medicine to prevent HIV, you should first get tested for  HIV. You should then be tested every 3 months for as long as you are taking the medicine. Pregnancy If you are about to stop having your period (premenopausal) and you may become pregnant, seek counseling before you get pregnant. Take 400 to 800 micrograms (mcg) of folic acid every day if you become pregnant. Ask for birth control (contraception) if you want to prevent pregnancy. Osteoporosis and menopause Osteoporosis is a disease in which the bones lose minerals and strength with aging. This can result in bone fractures. If you are 66 years old or older, or if you are at risk for osteoporosis and fractures, ask your health care provider if you should: Be screened for bone loss. Take a calcium  or vitamin D  supplement to lower your risk of fractures. Be given hormone replacement therapy (HRT) to treat symptoms of menopause. Follow these instructions at home: Alcohol use Do not drink alcohol if: Your health care provider tells you not to drink. You are pregnant, may be pregnant, or are planning to become pregnant. If you drink alcohol: Limit how much you have to: 0-1 drink a day. Know how much alcohol is in your drink. In the [place] one drink equals one 12 oz bottle of beer (355 mL), one 5 oz glass of wine (148 mL), or one 1 oz glass of hard liquor (44 mL). Lifestyle Do not use any products that contain nicotine or tobacco. These products include cigarettes, chewing tobacco, and vaping devices, such as e-cigarettes. If you need help quitting, ask your health care provider. Do not use street drugs. Do not share needles. Ask your health care provider for help if you need support or information about quitting drugs. General instructions Schedule regular health, dental, and eye exams. Stay current with your vaccines. Tell your health care provider if: You often feel depressed. You have ever been abused or do not feel safe at home. Summary Adopting a healthy lifestyle and getting preventive  care are important in promoting health and wellness. Follow your health care provider's instructions about healthy diet, exercising, and getting tested or screened for diseases. Follow your health care provider's instructions on monitoring your cholesterol and blood pressure. This information is not intended to replace advice given to you by your health care provider. Make sure you discuss any questions you have with your health care provider. Document Revised: 11/24/2020 Document Reviewed: 11/24/2020 Elsevier Patient Education  2024 [employer].
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
## 2024-03-05 ENCOUNTER — Encounter: Payer: Self-pay | Admitting: Internal Medicine

## 2024-03-05 ENCOUNTER — Ambulatory Visit (INDEPENDENT_AMBULATORY_CARE_PROVIDER_SITE_OTHER): Admitting: Internal Medicine

## 2024-03-05 VITALS — BP 128/82 | HR 68 | Temp 98.2°F | Ht 64.0 in | Wt 166.0 lb

## 2024-03-05 DIAGNOSIS — Z0001 Encounter for general adult medical examination with abnormal findings: Secondary | ICD-10-CM

## 2024-03-05 DIAGNOSIS — Z0000 Encounter for general adult medical examination without abnormal findings: Secondary | ICD-10-CM

## 2024-03-05 DIAGNOSIS — E785 Hyperlipidemia, unspecified: Secondary | ICD-10-CM | POA: Diagnosis not present

## 2024-03-05 DIAGNOSIS — G43409 Hemiplegic migraine, not intractable, without status migrainosus: Secondary | ICD-10-CM

## 2024-03-05 DIAGNOSIS — R0789 Other chest pain: Secondary | ICD-10-CM

## 2024-03-05 DIAGNOSIS — E559 Vitamin D deficiency, unspecified: Secondary | ICD-10-CM | POA: Diagnosis not present

## 2024-03-05 DIAGNOSIS — M549 Dorsalgia, unspecified: Secondary | ICD-10-CM | POA: Diagnosis not present

## 2024-03-05 DIAGNOSIS — M810 Age-related osteoporosis without current pathological fracture: Secondary | ICD-10-CM | POA: Diagnosis not present

## 2024-03-05 DIAGNOSIS — R7303 Prediabetes: Secondary | ICD-10-CM

## 2024-03-05 DIAGNOSIS — Z636 Dependent relative needing care at home: Secondary | ICD-10-CM | POA: Diagnosis not present

## 2024-03-05 DIAGNOSIS — M85851 Other specified disorders of bone density and structure, right thigh: Secondary | ICD-10-CM

## 2024-03-05 DIAGNOSIS — F418 Other specified anxiety disorders: Secondary | ICD-10-CM

## 2024-03-05 DIAGNOSIS — G379 Demyelinating disease of central nervous system, unspecified: Secondary | ICD-10-CM | POA: Diagnosis not present

## 2024-03-05 DIAGNOSIS — N1831 Chronic kidney disease, stage 3a: Secondary | ICD-10-CM | POA: Diagnosis not present

## 2024-03-05 LAB — COMPREHENSIVE METABOLIC PANEL WITH GFR
ALT: 26 U/L (ref 0–35)
AST: 22 U/L (ref 0–37)
Albumin: 4.5 g/dL (ref 3.5–5.2)
Alkaline Phosphatase: 74 U/L (ref 39–117)
BUN: 20 mg/dL (ref 6–23)
CO2: 26 meq/L (ref 19–32)
Calcium: 9.6 mg/dL (ref 8.4–10.5)
Chloride: 106 meq/L (ref 96–112)
Creatinine, Ser: 0.88 mg/dL (ref 0.40–1.20)
GFR: 63.6 mL/min (ref >60.00)
Glucose, Bld: 87 mg/dL (ref 70–99)
Potassium: 3.9 meq/L (ref 3.5–5.1)
Sodium: 143 meq/L (ref 135–145)
Total Bilirubin: 0.5 mg/dL (ref 0.2–1.2)
Total Protein: 7.4 g/dL (ref 6.0–8.3)

## 2024-03-05 LAB — CBC WITH DIFFERENTIAL/PLATELET
Basophils Absolute: 0.1 K/uL (ref 0.0–0.1)
Basophils Relative: 0.8 % (ref 0.0–3.0)
Eosinophils Absolute: 0.3 K/uL (ref 0.0–0.7)
Eosinophils Relative: 3.4 % (ref 0.0–5.0)
HCT: 42.3 % (ref 36.0–46.0)
Hemoglobin: 13.8 g/dL (ref 12.0–15.0)
Lymphocytes Relative: 49.7 % — ABNORMAL HIGH (ref 12.0–46.0)
Lymphs Abs: 4 K/uL (ref 0.7–4.0)
MCHC: 32.6 g/dL (ref 30.0–36.0)
MCV: 94.2 fl (ref 78.0–100.0)
Monocytes Absolute: 0.7 K/uL (ref 0.1–1.0)
Monocytes Relative: 8.7 % (ref 3.0–12.0)
Neutro Abs: 3 K/uL (ref 1.4–7.7)
Neutrophils Relative %: 37.4 % — ABNORMAL LOW (ref 43.0–77.0)
Platelets: 286 K/uL (ref 150.0–400.0)
RBC: 4.49 Mil/uL (ref 3.87–5.11)
RDW: 14.4 % (ref 11.5–15.5)
WBC: 8.1 K/uL (ref 4.0–10.5)

## 2024-03-05 LAB — VITAMIN D 25 HYDROXY (VIT D DEFICIENCY, FRACTURES): VITD: 45.68 ng/mL (ref 30.00–100.00)

## 2024-03-05 LAB — HEMOGLOBIN A1C: Hgb A1c MFr Bld: 6.1 % (ref 4.6–6.5)

## 2024-03-05 LAB — TSH: TSH: 2.7 u[IU]/mL (ref 0.35–5.50)

## 2024-03-05 LAB — LIPID PANEL
Cholesterol: 206 mg/dL — ABNORMAL HIGH (ref 0–200)
HDL: 68.2 mg/dL (ref >39.00)
LDL Cholesterol: 117 mg/dL — ABNORMAL HIGH (ref 0–99)
NonHDL: 137.73
Total CHOL/HDL Ratio: 3
Triglycerides: 106 mg/dL (ref 0.0–149.0)
VLDL: 21.2 mg/dL (ref 0.0–40.0)

## 2024-03-05 MED ORDER — ATORVASTATIN CALCIUM 40 MG PO TABS: 40.0000 mg | ORAL_TABLET | Freq: Every day | ORAL | 1 refills | Status: AC

## 2024-03-05 NOTE — Assessment & Plan Note (Signed)
 Has been experiencing chest pressure and also pressure or discomfort across her upper back ?  Musculoskeletal versus possible angina Tends to occur more with exertion, maybe sometimes with certain movements Refer to cardiology to rule out cardiac cause

## 2024-03-05 NOTE — Assessment & Plan Note (Signed)
 Seen on prior imaging Likely related to history of migraines

## 2024-03-05 NOTE — Assessment & Plan Note (Signed)
 Chronic Taking vitamin d daily Check vitamin d level

## 2024-03-05 NOTE — Assessment & Plan Note (Addendum)
 Chronic DEXA up-to-date Osteopenia, low FRAX DEXA every 2 years She is doing some walking-Will be able to walk dog more as the weather cools Check vitamin D  level

## 2024-03-05 NOTE — Assessment & Plan Note (Signed)
Chronic °Regular exercise and healthy diet encouraged °Check lipid panel, cmp °Continue atorvastatin 40 mg daily °

## 2024-03-05 NOTE — Assessment & Plan Note (Signed)
 Subacute Husband has several chronic medical problems - has had multiple hospitalizations  Has good support system Continue lexapro  10 mg daily

## 2024-03-05 NOTE — Assessment & Plan Note (Signed)
 Chronic Not ideally controlled Does not want to change medication since she is doing ok - dealing with the stress fairly well Continue Lexapro  10 mg daily

## 2024-03-05 NOTE — Assessment & Plan Note (Signed)
 Chronic Check a1c Low sugar / carb diet Stressed regular exercise

## 2024-03-05 NOTE — Assessment & Plan Note (Signed)
 New Has been experiencing chest pressure and also pressure or discomfort across her upper back ?  Musculoskeletal versus possible angina Tends to occur more with exertion, maybe sometimes with certain movements Refer to cardiology to rule out cardiac cause

## 2024-03-05 NOTE — Assessment & Plan Note (Signed)
 Chronic Has improved CMP, CBC Stressed importance of continuing increased fluid intake, avoiding NSAIDs

## 2024-03-05 NOTE — Assessment & Plan Note (Signed)
Chronic Occasional migraines Controlled with current medication as needed Continue Imitrex 50 mg as needed for migraine

## 2024-03-06 ENCOUNTER — Ambulatory Visit
Admission: RE | Admit: 2024-03-06 | Discharge: 2024-03-06 | Disposition: A | Discharge status: Home / Self Care | Source: Ambulatory Visit

## 2024-03-06 ENCOUNTER — Ambulatory Visit: Payer: Self-pay | Admitting: Internal Medicine

## 2024-03-06 DIAGNOSIS — Z1231 Encounter for screening mammogram for malignant neoplasm of breast: Secondary | ICD-10-CM

## 2024-03-08 ENCOUNTER — Ambulatory Visit

## 2024-04-04 DIAGNOSIS — M549 Dorsalgia, unspecified: Secondary | ICD-10-CM | POA: Diagnosis not present

## 2024-04-04 DIAGNOSIS — R0789 Other chest pain: Secondary | ICD-10-CM | POA: Diagnosis not present

## 2024-04-08 DIAGNOSIS — R079 Chest pain, unspecified: Secondary | ICD-10-CM | POA: Diagnosis not present

## 2024-04-11 ENCOUNTER — Ambulatory Visit (INDEPENDENT_AMBULATORY_CARE_PROVIDER_SITE_OTHER)

## 2024-04-11 VITALS — Ht 64.0 in | Wt 166.0 lb

## 2024-04-11 DIAGNOSIS — Z0000 Encounter for general adult medical examination without abnormal findings: Secondary | ICD-10-CM | POA: Diagnosis not present

## 2024-04-11 NOTE — Patient Instructions (Addendum)
 Ashley Cruz,  Thank you for taking the time for your Medicare Wellness Visit. I appreciate your continued commitment to your health goals. Please review the care plan we discussed, and feel free to reach out if I can assist you further.  Medicare recommends these wellness visits once per year to help you and your care team stay ahead of potential health issues. These visits are designed to focus on prevention, allowing your provider to concentrate on managing your acute and chronic conditions during your regular appointments.  Please note that Annual Wellness Visits do not include a physical exam. Some assessments may be limited, especially if the visit was conducted virtually. If needed, we may recommend a separate in-person follow-up with your provider.  Ongoing Care Seeing your primary care provider every 3 to 6 months helps us  monitor your health and provide consistent, personalized care.   Referrals If a referral was made during today's visit and you haven't received any updates within two weeks, please contact the referred provider directly to check on the status.  Recommended Screenings:  Health Maintenance  Topic Date Due   Zoster (Shingles) Vaccine (2 of 2) 07/01/2022   DTaP/Tdap/Td vaccine (2 - Td or Tdap) 06/05/2023   Flu Shot  02/17/2024   COVID-19 Vaccine (5 - 2025-26 season) 03/19/2024   DEXA scan (bone density measurement)  01/25/2025   Medicare Annual Wellness Visit  04/11/2025   Pneumococcal Vaccine for age over 24  Completed   Hepatitis C Screening  Completed   HPV Vaccine  Aged Out   Meningitis B Vaccine  Aged Out   Breast Cancer Screening  Discontinued   Cologuard (Stool DNA test)  Discontinued       04/11/2024    1:31 PM  Advanced Directives  Does Patient Have a Medical Advance Directive? Yes  Type of Estate agent of [place];Living will  Copy of Healthcare Power of Attorney in Chart? No - copy requested   Advance Care Planning is  important because it: Ensures you receive medical care that aligns with your values, goals, and preferences. Provides guidance to your family and loved ones, reducing the emotional burden of decision-making during critical moments.  Vision: Annual vision screenings are recommended for early detection of glaucoma, cataracts, and diabetic retinopathy. These exams can also reveal signs of chronic conditions such as diabetes and high blood pressure.  Dental: Annual dental screenings help detect early signs of oral cancer, gum disease, and other conditions linked to overall health, including heart disease and diabetes.

## 2024-04-11 NOTE — Progress Notes (Signed)
 Subjective:   Villa  T Montoya is a 78 y.o. who presents for a Medicare Wellness preventive visit.  As a reminder, Annual Wellness Visits don't include a physical exam, and some assessments may be limited, especially if this visit is performed virtually. We may recommend an in-person follow-up visit with your provider if needed.  Visit Complete: Virtual I connected with  Gaynell  T Parson on 04/11/24 by a audio enabled telemedicine application and verified that I am speaking with the correct person using two identifiers.  Patient Location: Home  Provider Location: Office/Clinic  I discussed the limitations of evaluation and management by telemedicine. The patient expressed understanding and agreed to proceed.  Vital Signs: Because this visit was a virtual/telehealth visit, some criteria may be missing or patient reported. Any vitals not documented were not able to be obtained and vitals that have been documented are patient reported.  VideoDeclined- This patient declined Librarian, academic. Therefore the visit was completed with audio only.  Persons Participating in Visit: Patient.  AWV Questionnaire: No: Patient Medicare AWV questionnaire was not completed prior to this visit.  Cardiac Risk Factors include: advanced age (>47men, >57 women);dyslipidemia     Objective:    Today's Vitals   04/11/24 1331  Weight: 166 lb (75.3 kg)  Height: 5' 4 (1.626 m)   Body mass index is 28.49 kg/m.     04/11/2024    1:31 PM 06/23/2021   10:59 AM 03/05/2020    2:38 PM 11/09/2018    1:39 PM 11/07/2017    3:43 PM 11/03/2016    4:00 PM  Advanced Directives  Does Patient Have a Medical Advance Directive? Yes Yes Yes No No  Yes   Type of Estate agent of [place];Living will Healthcare Power of [place];Living will Living will;Healthcare Power of Attorney     Does patient want to make changes to medical advance directive?  No - Patient declined No  - Patient declined  Yes (ED - Information included in AVS)  Yes (ED - Information included in AVS)   Copy of Healthcare Power of Attorney in Chart? No - copy requested Yes - validated most recent copy scanned in chart (See row information)      Would patient like information on creating a medical advance directive?    Yes (ED - Information included in AVS)        Data saved with a previous flowsheet row definition    Current Medications (verified) Outpatient Encounter Medications as of 04/11/2024  Medication Sig   atorvastatin  (LIPITOR) 40 MG tablet Take 1 tablet (40 mg total) by mouth daily.   clobetasol  ointment (TEMOVATE ) 0.05 % Apply 1 Application topically 2 (two) times daily.   escitalopram  (LEXAPRO ) 10 MG tablet TAKE 1 TABLET(10 MG) BY MOUTH DAILY   fluorouracil (EFUDEX) 5 % cream 1 application   SUMAtriptan  (IMITREX ) 50 MG tablet Take 1 tablet (50 mg total) by mouth every 2 (two) hours as needed for migraine.   No facility-administered encounter medications on file as of 04/11/2024.    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   GERD (gastroesophageal reflux disease)    History  of pyelonephritis 2009   Hyperlipidemia    Migraine    Osteoporosis    Past Surgical History:  Procedure Laterality Date   ABDOMINAL HYSTERECTOMY     due to fibroids   BREAST BIOPSY Left 1995   hx of breast biopsy  1990   neg   OOPHORECTOMY  1977   due to endometriosis   s/p Right ectopic kidney   78 yrs old   TONSILLECTOMY     Family History  Problem Relation Age of Onset   Cancer Mother        Cancer   Hyperlipidemia Mother    Alcohol abuse Mother    Breast cancer Mother 52   Hyperlipidemia  Sister    Breast cancer Maternal Aunt 42   Breast cancer Cousin 25   Diabetes Other        uncle   Alcohol abuse Other        uncle   Heart disease Neg Hx    Stroke Neg Hx    Hypertension Neg Hx    Kidney disease Neg Hx    Social History   Socioeconomic History   Marital status: Married    Spouse name: Not on file   Number of children: 3   Years of education: Not on file   Highest education level: 12th grade  Occupational History   Not on file  Tobacco Use   Smoking status: Never   Smokeless tobacco: Never  Vaping Use   Vaping status: Never Used  Substance and Sexual Activity   Alcohol use: Yes    Alcohol/week: 1.0 standard drink of alcohol    Types: 1 Glasses of wine per week    Comment: wine once a month at most   Drug use: No   Sexual activity: Yes    Birth control/protection: Surgical  Other Topics Concern   Not on file  Social History Narrative   Married   Social Drivers of Health   Financial Resource Strain: Low Risk  (04/11/2024)   Overall Financial Resource Strain (CARDIA)    Difficulty of Paying Living Expenses: Not hard at all  Food Insecurity: No Food Insecurity (04/11/2024)   Hunger Vital Sign    Worried About Running Out of Food in the Last Year: Never true    Ran Out of Food in the Last Year: Never true  Transportation Needs: No Transportation Needs (04/11/2024)   PRAPARE - Administrator, Civil Service (Medical): No    Lack of Transportation (Non-Medical): No  Physical Activity: Inactive (04/11/2024)   Exercise Vital Sign    Days of Exercise per Week: 0 days    Minutes of Exercise per Session: 0 min  Stress: Stress Concern Present (04/11/2024)   [employer] of Occupational Health - Occupational Stress Questionnaire    Feeling of Stress: Very much  Social Connections: Socially Isolated (04/11/2024)   Social Connection and Isolation Panel    Frequency of Communication with Friends and Family: Once a week    Frequency of Social  Gatherings with Friends and Family: Never    Attends Religious Services: Never    Database administrator or Organizations: No    Attends Engineer, structural: Never    Marital Status: Married    Tobacco Counseling Counseling given: Not Answered    Clinical Intake:  Pre-visit preparation completed: Yes  Pain : No/denies pain     BMI - recorded: 28.49 Nutritional Status: BMI 25 -29 Overweight Nutritional Risks: None Diabetes:  No  Lab Results  Component Value Date   HGBA1C 6.1 03/05/2024   HGBA1C 6.3 07/21/2023   HGBA1C  11/26/2008    5.7 (NOTE) The ADA recommends the following therapeutic goal for glycemic control related to Hgb A1c measurement: Goal of therapy: <6.5 Hgb A1c  Reference: American Diabetes Association: Clinical Practice Recommendations 2010, Diabetes Care, 2010, 33: (Suppl  1).     How often do you need to have someone help you when you read instructions, pamphlets, or other written materials from your doctor or pharmacy?: 1 - Never  Interpreter Needed?: No  Information entered by :: Verdie Saba, CMA   Activities of Daily Living     04/11/2024    1:34 PM  In your present state of health, do you have any difficulty performing the following activities:  Hearing? 0  Vision? 0  Difficulty concentrating or making decisions? 0  Walking or climbing stairs? 0  Dressing or bathing? 0  Doing errands, shopping? 0  Preparing Food and eating ? N  Using the Toilet? N  In the past six months, have you accidently leaked urine? Y  Comment wears a pantyliner  Do you have problems with loss of bowel control? N  Managing your Medications? N  Managing your Finances? N  Housekeeping or managing your Housekeeping? N    Patient Care Team: Geofm Glade PARAS, MD as PCP - General (Internal Medicine)  I have updated your Care Teams any recent Medical Services you may have received from other providers in the past year.     Assessment:   This is a routine  wellness examination for Britanny .  Hearing/Vision screen Hearing Screening - Comments:: Denies hearing difficulties   Vision Screening - Comments:: Wears eyeglasses for reading only   Goals Addressed               This Visit's Progress     Patient Stated (pt-stated)        Patient stated she plans to continue to lose weight       Depression Screen     04/11/2024    1:35 PM 03/05/2024    2:02 PM 06/28/2023    1:53 PM 11/30/2022   10:49 AM 09/04/2021    2:03 PM 03/05/2020    2:40 PM 03/05/2020    2:36 PM  PHQ 2/9 Scores  PHQ - 2 Score 3 1 0 0 0 0 0  PHQ- 9 Score 6 3 0 0 2      Fall Risk     04/11/2024    1:34 PM 03/05/2024    2:02 PM 06/28/2023    1:52 PM 08/30/2022   12:27 PM 09/04/2021    2:03 PM  Fall Risk   Falls in the past year? 0 0 0 0 1  Number falls in past yr: 0 0 0  0  Injury with Fall? 0 0 0  0  Risk for fall due to : No Fall Risks No Fall Risks No Fall Risks  No Fall Risks  Follow up Falls evaluation completed;Falls prevention discussed Falls evaluation completed Falls evaluation completed  Falls evaluation completed      Data saved with a previous flowsheet row definition    MEDICARE RISK AT HOME:  Medicare Risk at Home Any stairs in or around the home?: No If so, are there any without handrails?: No Home free of loose throw rugs in walkways, pet beds, electrical cords, etc?: Yes Adequate lighting in your home to reduce risk of  falls?: Yes Life alert?: No Use of a cane, walker or w/c?: No Grab bars in the bathroom?: No Shower chair or bench in shower?: No Elevated toilet seat or a handicapped toilet?: No  TIMED UP AND GO:  Was the test performed?  No  Cognitive Function: 6CIT completed    11/07/2017    3:57 PM  MMSE - Mini Mental State Exam  Orientation to time 5  Orientation to Place 5  Registration 3  Attention/ Calculation 5  Recall 2  Language- name 2 objects 2  Language- repeat 1  Language- follow 3 step command 3  Language- read  & follow direction 1  Write a sentence 1  Copy design 1  Total score 29        04/11/2024    1:43 PM 03/05/2020    2:42 PM  6CIT Screen  What Year? 0 points 0 points  What month? 0 points 0 points  What time? 0 points 0 points  Count back from 20 0 points 0 points  Months in reverse 0 points 0 points  Repeat phrase 0 points 0 points  Total Score 0 points 0 points    Immunizations Immunization History  Administered Date(s) Administered   Fluad Quad(high Dose 65+) 04/29/2022   INFLUENZA, HIGH DOSE SEASONAL PF 06/04/2013, 04/14/2016, 05/17/2017, 05/01/2019   Influenza-Unspecified 04/09/2021, 04/22/2023   PFIZER(Purple Top)SARS-COV-2 Vaccination 08/11/2019, 09/01/2019, 04/29/2020   Pfizer Covid-19 Vaccine Bivalent Booster 82yrs & up 05/17/2022   Pneumococcal Conjugate-13 06/04/2013   Pneumococcal Polysaccharide-23 05/17/2017   Tdap 06/04/2013   Zoster Recombinant(Shingrix) 05/06/2022    Screening Tests Health Maintenance  Topic Date Due   Zoster Vaccines- Shingrix (2 of 2) 07/01/2022   DTaP/Tdap/Td (2 - Td or Tdap) 06/05/2023   Influenza Vaccine  02/17/2024   COVID-19 Vaccine (5 - 2025-26 season) 03/19/2024   DEXA SCAN  01/25/2025   Medicare Annual Wellness (AWV)  04/11/2025   Pneumococcal Vaccine: 50+ Years  Completed   Hepatitis C Screening  Completed   HPV VACCINES  Aged Out   Meningococcal B Vaccine  Aged Out   Mammogram  Discontinued   Fecal DNA (Cologuard)  Discontinued    Health Maintenance Items Addressed:  04/11/2024  Additional Screening:  Vision Screening: Recommended annual ophthalmology exams for early detection of glaucoma and other disorders of the eye. Is the patient up to date with their annual eye exam?  Yes  Who is the provider or what is the name of the office in which the patient attends annual eye exams?   Dental Screening: Recommended annual dental exams for proper oral hygiene  Community Resource Referral / Chronic Care Management: CRR  required this visit?  No   CCM required this visit?  No   Plan:    I have personally reviewed and noted the following in the patient's chart:   Medical and social history Use of alcohol, tobacco or illicit drugs  Current medications and supplements including opioid prescriptions. Patient is not currently taking opioid prescriptions. Functional ability and status Nutritional status Physical activity Advanced directives List of other physicians Hospitalizations, surgeries, and ER visits in previous 12 months Vitals Screenings to include cognitive, depression, and falls Referrals and appointments  In addition, I have reviewed and discussed with patient certain preventive protocols, quality metrics, and best practice recommendations. A written personalized care plan for preventive services as well as general preventive health recommendations were provided to patient.   Verdie CHRISTELLA Saba, CMA   04/11/2024   After Visit  Summary: (MyChart) Due to this being a telephonic visit, the after visit summary with patients personalized plan was offered to patient via MyChart   Notes: Scheduled 1-yr Physical w/PCP for 03/2025.

## 2024-05-25 ENCOUNTER — Other Ambulatory Visit: Payer: Self-pay | Admitting: Internal Medicine

## 2024-05-25 DIAGNOSIS — F418 Other specified anxiety disorders: Secondary | ICD-10-CM

## 2025-03-06 ENCOUNTER — Encounter: Admitting: Internal Medicine

## 2025-04-16 ENCOUNTER — Encounter: Admitting: Internal Medicine

## 2025-04-16 ENCOUNTER — Ambulatory Visit
